# Patient Record
Sex: Female | Born: 1945 | Race: White | Hispanic: No | Marital: Single | Smoking: Former smoker
Health system: Southern US, Community
[De-identification: ages and names within clinical notes are randomized; demographics above are authoritative.]

## PROBLEM LIST (undated history)

## (undated) DIAGNOSIS — F419 Anxiety disorder, unspecified: Secondary | ICD-10-CM

## (undated) DIAGNOSIS — F410 Panic disorder [episodic paroxysmal anxiety] without agoraphobia: Secondary | ICD-10-CM

## (undated) DIAGNOSIS — J449 Chronic obstructive pulmonary disease, unspecified: Secondary | ICD-10-CM

## (undated) HISTORY — PX: TUBAL LIGATION: SHX77

---

## 2009-03-06 ENCOUNTER — Emergency Department (HOSPITAL_COMMUNITY): Admission: EM | Admit: 2009-03-06 | Discharge: 2009-03-06 | Payer: Self-pay | Admitting: Emergency Medicine

## 2010-12-16 ENCOUNTER — Emergency Department (HOSPITAL_COMMUNITY)
Admission: EM | Admit: 2010-12-16 | Discharge: 2010-12-16 | Disposition: A | Discharge status: Home / Self Care | Payer: Medicare Other | Attending: Emergency Medicine | Admitting: Emergency Medicine

## 2010-12-16 ENCOUNTER — Emergency Department (HOSPITAL_COMMUNITY): Payer: Medicare Other

## 2010-12-16 DIAGNOSIS — R569 Unspecified convulsions: Secondary | ICD-10-CM | POA: Insufficient documentation

## 2010-12-16 DIAGNOSIS — S81009A Unspecified open wound, unspecified knee, initial encounter: Secondary | ICD-10-CM | POA: Insufficient documentation

## 2010-12-16 DIAGNOSIS — W230XXA Caught, crushed, jammed, or pinched between moving objects, initial encounter: Secondary | ICD-10-CM | POA: Insufficient documentation

## 2010-12-16 DIAGNOSIS — S91009A Unspecified open wound, unspecified ankle, initial encounter: Secondary | ICD-10-CM | POA: Insufficient documentation

## 2010-12-16 DIAGNOSIS — S8010XA Contusion of unspecified lower leg, initial encounter: Secondary | ICD-10-CM | POA: Insufficient documentation

## 2010-12-16 DIAGNOSIS — Y92009 Unspecified place in unspecified non-institutional (private) residence as the place of occurrence of the external cause: Secondary | ICD-10-CM | POA: Insufficient documentation

## 2010-12-16 DIAGNOSIS — M79609 Pain in unspecified limb: Secondary | ICD-10-CM | POA: Insufficient documentation

## 2010-12-16 DIAGNOSIS — J4489 Other specified chronic obstructive pulmonary disease: Secondary | ICD-10-CM | POA: Insufficient documentation

## 2010-12-16 DIAGNOSIS — J449 Chronic obstructive pulmonary disease, unspecified: Secondary | ICD-10-CM | POA: Insufficient documentation

## 2011-07-16 ENCOUNTER — Emergency Department (HOSPITAL_COMMUNITY): Payer: Medicare Other

## 2011-07-16 ENCOUNTER — Emergency Department (HOSPITAL_COMMUNITY)
Admission: EM | Admit: 2011-07-16 | Discharge: 2011-07-16 | Disposition: A | Discharge status: Home / Self Care | Payer: Medicare Other | Attending: Emergency Medicine | Admitting: Emergency Medicine

## 2011-07-16 ENCOUNTER — Encounter: Payer: Self-pay | Admitting: *Deleted

## 2011-07-16 DIAGNOSIS — M545 Low back pain, unspecified: Secondary | ICD-10-CM | POA: Insufficient documentation

## 2011-07-16 DIAGNOSIS — J4489 Other specified chronic obstructive pulmonary disease: Secondary | ICD-10-CM | POA: Insufficient documentation

## 2011-07-16 DIAGNOSIS — W19XXXA Unspecified fall, initial encounter: Secondary | ICD-10-CM | POA: Insufficient documentation

## 2011-07-16 DIAGNOSIS — J449 Chronic obstructive pulmonary disease, unspecified: Secondary | ICD-10-CM | POA: Insufficient documentation

## 2011-07-16 DIAGNOSIS — F411 Generalized anxiety disorder: Secondary | ICD-10-CM | POA: Insufficient documentation

## 2011-07-16 DIAGNOSIS — S139XXA Sprain of joints and ligaments of unspecified parts of neck, initial encounter: Secondary | ICD-10-CM | POA: Insufficient documentation

## 2011-07-16 DIAGNOSIS — S161XXA Strain of muscle, fascia and tendon at neck level, initial encounter: Secondary | ICD-10-CM

## 2011-07-16 DIAGNOSIS — S8010XA Contusion of unspecified lower leg, initial encounter: Secondary | ICD-10-CM | POA: Insufficient documentation

## 2011-07-16 DIAGNOSIS — M25539 Pain in unspecified wrist: Secondary | ICD-10-CM | POA: Insufficient documentation

## 2011-07-16 DIAGNOSIS — IMO0002 Reserved for concepts with insufficient information to code with codable children: Secondary | ICD-10-CM | POA: Insufficient documentation

## 2011-07-16 DIAGNOSIS — M542 Cervicalgia: Secondary | ICD-10-CM | POA: Insufficient documentation

## 2011-07-16 DIAGNOSIS — Z79899 Other long term (current) drug therapy: Secondary | ICD-10-CM | POA: Insufficient documentation

## 2011-07-16 DIAGNOSIS — M25579 Pain in unspecified ankle and joints of unspecified foot: Secondary | ICD-10-CM | POA: Insufficient documentation

## 2011-07-16 DIAGNOSIS — M79609 Pain in unspecified limb: Secondary | ICD-10-CM | POA: Insufficient documentation

## 2011-07-16 DIAGNOSIS — R071 Chest pain on breathing: Secondary | ICD-10-CM | POA: Insufficient documentation

## 2011-07-16 DIAGNOSIS — M25571 Pain in right ankle and joints of right foot: Secondary | ICD-10-CM

## 2011-07-16 DIAGNOSIS — T148XXA Other injury of unspecified body region, initial encounter: Secondary | ICD-10-CM

## 2011-07-16 HISTORY — DX: Chronic obstructive pulmonary disease, unspecified: J44.9

## 2011-07-16 HISTORY — DX: Panic disorder (episodic paroxysmal anxiety): F41.0

## 2011-07-16 HISTORY — DX: Anxiety disorder, unspecified: F41.9

## 2011-07-16 MED ORDER — OXYCODONE-ACETAMINOPHEN 5-325 MG PO TABS: 1.0000 | ORAL_TABLET | ORAL | Status: AC | PRN

## 2011-07-16 MED ORDER — KETOROLAC TROMETHAMINE 60 MG/2ML IM SOLN
60.0000 mg | Freq: Once | INTRAMUSCULAR | Status: AC
Start: 2011-07-16 — End: 2011-07-16
  Administered 2011-07-16: 60 mg via INTRAMUSCULAR
  Filled 2011-07-16: qty 2

## 2011-07-16 NOTE — ED Notes (Signed)
Ambulated to bathroom with 1 assist. Gait slow but steady

## 2011-07-16 NOTE — ED Notes (Signed)
Pt. C/O right rib and back pain

## 2011-07-16 NOTE — ED Notes (Signed)
Pt. Discharged to home, to discharge via w/c, Pt. Alert and oriented, NAD noted,
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
## 2012-12-18 ENCOUNTER — Ambulatory Visit (INDEPENDENT_AMBULATORY_CARE_PROVIDER_SITE_OTHER): Payer: Medicare Other | Admitting: Family Medicine

## 2012-12-18 VITALS — BP 137/78 | HR 70 | Temp 97.6°F | Resp 18 | Ht 63.0 in | Wt 295.0 lb

## 2012-12-18 DIAGNOSIS — S46911A Strain of unspecified muscle, fascia and tendon at shoulder and upper arm level, right arm, initial encounter: Secondary | ICD-10-CM

## 2012-12-18 DIAGNOSIS — S139XXA Sprain of joints and ligaments of unspecified parts of neck, initial encounter: Secondary | ICD-10-CM

## 2012-12-18 DIAGNOSIS — M25562 Pain in left knee: Secondary | ICD-10-CM

## 2012-12-18 DIAGNOSIS — M2550 Pain in unspecified joint: Secondary | ICD-10-CM

## 2012-12-18 DIAGNOSIS — IMO0001 Reserved for inherently not codable concepts without codable children: Secondary | ICD-10-CM

## 2012-12-18 DIAGNOSIS — IMO0002 Reserved for concepts with insufficient information to code with codable children: Secondary | ICD-10-CM

## 2012-12-18 DIAGNOSIS — S161XXA Strain of muscle, fascia and tendon at neck level, initial encounter: Secondary | ICD-10-CM

## 2012-12-18 MED ORDER — TRAMADOL HCL 50 MG PO TABS: 50.0000 mg | ORAL_TABLET | Freq: Three times a day (TID) | ORAL | Status: DC | PRN

## 2012-12-18 MED ORDER — OXAPROZIN 600 MG PO TABS: ORAL_TABLET | ORAL | Status: DC

## 2012-12-18 MED ORDER — METHOCARBAMOL 500 MG PO TABS: ORAL_TABLET | ORAL | Status: DC

## 2012-12-18 NOTE — Patient Instructions (Addendum)
Take medications as directed  Apply ice and heat alternately to the neck several times a day.  Return if not steadily improving
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
## 2012-12-19 ENCOUNTER — Encounter: Payer: Self-pay | Admitting: Family Medicine

## 2012-12-29 ENCOUNTER — Telehealth: Payer: Self-pay | Admitting: Radiology

## 2012-12-29 NOTE — Telephone Encounter (Signed)
Tammy at [facility] [address] called about patient, requesting additional notes. Advised only note we have is from office visit on 12/16/12 , she states they already have this.

## 2013-01-15 ENCOUNTER — Emergency Department (HOSPITAL_COMMUNITY): Payer: Medicare Other

## 2013-01-15 ENCOUNTER — Emergency Department (HOSPITAL_COMMUNITY)
Admission: EM | Admit: 2013-01-15 | Discharge: 2013-01-15 | Disposition: A | Discharge status: Home / Self Care | Payer: Medicare Other | Attending: Emergency Medicine | Admitting: Emergency Medicine

## 2013-01-15 ENCOUNTER — Encounter (HOSPITAL_COMMUNITY): Payer: Self-pay | Admitting: Adult Health

## 2013-01-15 DIAGNOSIS — J449 Chronic obstructive pulmonary disease, unspecified: Secondary | ICD-10-CM | POA: Insufficient documentation

## 2013-01-15 DIAGNOSIS — J4489 Other specified chronic obstructive pulmonary disease: Secondary | ICD-10-CM | POA: Insufficient documentation

## 2013-01-15 DIAGNOSIS — IMO0002 Reserved for concepts with insufficient information to code with codable children: Secondary | ICD-10-CM | POA: Insufficient documentation

## 2013-01-15 DIAGNOSIS — Z79899 Other long term (current) drug therapy: Secondary | ICD-10-CM | POA: Insufficient documentation

## 2013-01-15 DIAGNOSIS — F410 Panic disorder [episodic paroxysmal anxiety] without agoraphobia: Secondary | ICD-10-CM | POA: Insufficient documentation

## 2013-01-15 DIAGNOSIS — R339 Retention of urine, unspecified: Secondary | ICD-10-CM | POA: Insufficient documentation

## 2013-01-15 DIAGNOSIS — Z87891 Personal history of nicotine dependence: Secondary | ICD-10-CM | POA: Insufficient documentation

## 2013-01-15 LAB — URINALYSIS W MICROSCOPIC + REFLEX CULTURE
Hgb urine dipstick: NEGATIVE
Specific Gravity, Urine: 1.015 (ref 1.005–1.030)
Urobilinogen, UA: 0.2 mg/dL (ref 0.0–1.0)

## 2013-01-15 LAB — POCT I-STAT TROPONIN I
Troponin i, poc: 0 ng/mL (ref 0.00–0.08)
Troponin i, poc: 0 ng/mL (ref 0.00–0.08)

## 2013-01-15 LAB — BASIC METABOLIC PANEL
BUN: 15 mg/dL (ref 6–23)
CO2: 29 mEq/L (ref 19–32)
Chloride: 104 mEq/L (ref 96–112)
Creatinine, Ser: 0.8 mg/dL (ref 0.50–1.10)

## 2013-01-15 LAB — CBC
HCT: 43.8 % (ref 36.0–46.0)
MCV: 96.5 fL (ref 78.0–100.0)
Platelets: 217 10*3/uL (ref 150–400)
RBC: 4.54 MIL/uL (ref 3.87–5.11)
WBC: 9.5 10*3/uL (ref 4.0–10.5)

## 2013-01-15 MED ORDER — CIPROFLOXACIN HCL 250 MG PO TABS: 250.0000 mg | ORAL_TABLET | Freq: Two times a day (BID) | ORAL | Status: DC

## 2013-01-15 MED ORDER — OXYCODONE-ACETAMINOPHEN 5-325 MG PO TABS
1.0000 | ORAL_TABLET | Freq: Once | ORAL | Status: AC
  Administered 2013-01-15: 1 via ORAL
  Filled 2013-01-15: qty 1
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
## 2013-01-15 NOTE — ED Notes (Signed)
Pt  Discharged.Vital signs stable and GCS 15 

## 2013-01-15 NOTE — ED Provider Notes (Signed)
I have personally seen and examined the patient.  I have discussed the plan of care with the resident.  I have reviewed the documentation on PMH/FH/Soc. History.  I have reviewed the documentation of the resident and agree.  Sible Straley, MD 01/15/13 2347 
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
## 2013-01-15 NOTE — ED Notes (Signed)
Bladder scan 435 ml

## 2013-01-17 ENCOUNTER — Emergency Department (HOSPITAL_COMMUNITY): Payer: Medicare Other

## 2013-01-17 ENCOUNTER — Encounter (HOSPITAL_COMMUNITY): Payer: Self-pay | Admitting: Emergency Medicine

## 2013-01-17 ENCOUNTER — Emergency Department (HOSPITAL_COMMUNITY)
Admission: EM | Admit: 2013-01-17 | Discharge: 2013-01-17 | Disposition: A | Discharge status: Home / Self Care | Payer: Medicare Other | Attending: Emergency Medicine | Admitting: Emergency Medicine

## 2013-01-17 DIAGNOSIS — M51379 Other intervertebral disc degeneration, lumbosacral region without mention of lumbar back pain or lower extremity pain: Secondary | ICD-10-CM | POA: Insufficient documentation

## 2013-01-17 DIAGNOSIS — M5137 Other intervertebral disc degeneration, lumbosacral region: Secondary | ICD-10-CM | POA: Insufficient documentation

## 2013-01-17 DIAGNOSIS — R399 Unspecified symptoms and signs involving the genitourinary system: Secondary | ICD-10-CM

## 2013-01-17 DIAGNOSIS — Z79899 Other long term (current) drug therapy: Secondary | ICD-10-CM | POA: Insufficient documentation

## 2013-01-17 DIAGNOSIS — F411 Generalized anxiety disorder: Secondary | ICD-10-CM | POA: Insufficient documentation

## 2013-01-17 DIAGNOSIS — M5136 Other intervertebral disc degeneration, lumbar region: Secondary | ICD-10-CM

## 2013-01-17 DIAGNOSIS — J449 Chronic obstructive pulmonary disease, unspecified: Secondary | ICD-10-CM | POA: Insufficient documentation

## 2013-01-17 DIAGNOSIS — M545 Low back pain, unspecified: Secondary | ICD-10-CM | POA: Insufficient documentation

## 2013-01-17 DIAGNOSIS — IMO0002 Reserved for concepts with insufficient information to code with codable children: Secondary | ICD-10-CM | POA: Insufficient documentation

## 2013-01-17 DIAGNOSIS — Z87891 Personal history of nicotine dependence: Secondary | ICD-10-CM | POA: Insufficient documentation

## 2013-01-17 DIAGNOSIS — J4489 Other specified chronic obstructive pulmonary disease: Secondary | ICD-10-CM | POA: Insufficient documentation

## 2013-01-17 LAB — URINE CULTURE: Culture: NO GROWTH
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
## 2013-01-17 NOTE — ED Notes (Signed)
Pt  Was able to walk to the bathroom and void 40 ml. RN notified.
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
## 2013-02-02 ENCOUNTER — Encounter: Payer: Self-pay | Admitting: Emergency Medicine

## 2013-02-03 ENCOUNTER — Encounter: Payer: Self-pay | Admitting: Emergency Medicine

## 2013-02-03 ENCOUNTER — Ambulatory Visit (INDEPENDENT_AMBULATORY_CARE_PROVIDER_SITE_OTHER): Payer: Medicare Other | Admitting: Emergency Medicine

## 2013-02-03 VITALS — BP 122/70 | HR 80 | Temp 97.7°F | Ht 63.0 in | Wt 207.8 lb

## 2013-02-03 DIAGNOSIS — J449 Chronic obstructive pulmonary disease, unspecified: Secondary | ICD-10-CM

## 2013-02-03 DIAGNOSIS — F411 Generalized anxiety disorder: Secondary | ICD-10-CM | POA: Insufficient documentation

## 2013-02-03 DIAGNOSIS — J309 Allergic rhinitis, unspecified: Secondary | ICD-10-CM | POA: Insufficient documentation

## 2013-02-03 NOTE — Progress Notes (Signed)
Subjective:    Patient ID: Kathy Skinner, female    DOB: 24-Oct-1945, 67 y.o.   MRN: [id number]  HPI 67 yo former smoker (50 pk-yrs), hx COPD dx in ~2004, also carries dx ? asthma although suspect mostly COPD. Anxiety and panic attacks. Has been maintained on Advair + Spiriva for years.  She uses albuterol prn, for the last 3-4 weeks has used 4x a day.  She has been having more SOB at rest and w exertion. More secretions and cough.  Has baseline daily wheezing. She is on zyrtec + singulair. She has not tolerated nasal sprays in the past.   She is on chantix recently, has been on for several year off and on that she is using at times when she feels that she is going to restart smoking.   Cleda Daub 5/'14 at PCP:  FVC 2.0L 68 % FEV1 1.2L 49% Ratio 58%   Review of Systems  Constitutional: Negative for fever and unexpected weight change.  HENT: Positive for congestion, postnasal drip and sinus pressure. Negative for ear pain, nosebleeds, sore throat, rhinorrhea, sneezing, trouble swallowing and dental problem.   Eyes: Negative for redness and itching.  Respiratory: Positive for cough, chest tightness, shortness of breath and wheezing.   Cardiovascular: Positive for leg swelling. Negative for palpitations.  Gastrointestinal: Negative for nausea and vomiting.  Genitourinary: Negative for dysuria.  Musculoskeletal: Negative for joint swelling.  Skin: Negative for rash.  Neurological: Positive for dizziness and headaches.  Hematological: Does not bruise/bleed easily.  Psychiatric/Behavioral: Negative for dysphoric mood. The patient is not nervous/anxious.     Past Medical History  Diagnosis Date  . COPD (chronic obstructive pulmonary disease)   . Asthma   . Anxiety   . Panic attacks      Family History  Problem Relation Age of Onset  . Cancer Mother   . Aneurysm Father      History   Social History  . Marital Status: Single    Spouse Name: N/A    Number of Children: N/A  . Years  of Education: N/A   Occupational History  . Not on file.   Social History Main Topics  . Smoking status: Former Smoker -- 1.00 packs/day for 50 years    Types: Cigarettes    Quit date: 12/19/1999  . Smokeless tobacco: Never Used  . Alcohol Use: No  . Drug Use: No  . Sexually Active: Not Currently   Other Topics Concern  . Not on file   Social History Narrative  . No narrative on file  has been exposed to pesticides, mulches, gardening.   Allergies  Allergen Reactions  . Codeine Nausea Only  . Neurontin (Gabapentin)     seizures  . Tramadol Swelling     Outpatient Prescriptions Prior to Visit  Medication Sig Dispense Refill  . albuterol (PROVENTIL HFA;VENTOLIN HFA) 108 (90 BASE) MCG/ACT inhaler Inhale 2 puffs into the lungs every 6 (six) hours as needed for wheezing.      Marland Kitchen albuterol (PROVENTIL) (2.5 MG/3ML) 0.083% nebulizer solution Take 2.5 mg by nebulization every 6 (six) hours as needed for wheezing.      Marland Kitchen ALPRAZolam (XANAX) 1 MG tablet Take 1 mg by mouth daily. For anxiety      . cetirizine (ZYRTEC) 10 MG tablet Take 10 mg by mouth daily as needed for allergies.      . diazepam (VALIUM) 10 MG tablet Take 10 mg by mouth daily.       . Fluticasone-Salmeterol (ADVAIR)  500-50 MCG/DOSE AEPB Inhale 1 puff into the lungs every 12 (twelve) hours.      Marland Kitchen losartan-hydrochlorothiazide (HYZAAR) 100-25 MG per tablet Take 0.5-1 tablets by mouth daily.       . montelukast (SINGULAIR) 10 MG tablet Take 10 mg by mouth every morning.       . naproxen sodium (ANAPROX) 220 MG tablet Take 440 mg by mouth 2 (two) times daily as needed (for pain).      Marland Kitchen tiotropium (SPIRIVA) 18 MCG inhalation capsule Place 18 mcg into inhaler and inhale daily.       No facility-administered medications prior to visit.         Objective:   Physical Exam Filed Vitals:   02/03/13 1414  BP: 122/70  Pulse: 80  Temp: 97.7 F (36.5 C)  TempSrc: Oral  Height: 5\' 3"  (1.6 m)  Weight: 207 lb 12.8 oz  (94.257 kg)  SpO2: 93%   Gen: Pleasant, overwt, in no distress,  normal affect  ENT: No lesions,  mouth clear,  oropharynx clear, no postnasal drip  Neck: No JVD, no TMG, no carotid bruits, insp and exp stridor  Lungs: No use of accessory muscles, some coarse sounds and lot of referred UA noise.   Cardiovascular: RRR, heart sounds normal, no murmur or gallops, no peripheral edema  Musculoskeletal: No deformities, no cyanosis or clubbing  Neuro: alert, non focal  Skin: Warm, no lesions or rashes      Assessment & Plan:  Allergic rhinitis Will continue singulair, try using zyrtec daily through the allergy season and then wean to off per her usual pattern  COPD (chronic obstructive pulmonary disease) continue same BDs Repeat {PFT at a future date.  CXR next time Ov 3 mon

## 2013-02-03 NOTE — Assessment & Plan Note (Signed)
Will continue singulair, try using zyrtec daily through the allergy season and then wean to off per her usual pattern

## 2013-02-03 NOTE — Assessment & Plan Note (Signed)
continue same BDs Repeat {PFT at a future date.  CXR next time Ov 3 mon

## 2013-02-03 NOTE — Patient Instructions (Addendum)
Please continue your Advair and Spiriva as you are taking them You may want to try using your Zyrtec every day through the pollen season, then try to wean to off Continue your Singulair Use your albuterol as needed Follow with Dr Delton Coombes in 3 months or sooner if you have any problems.

## 2013-02-16 ENCOUNTER — Telehealth: Payer: Self-pay | Admitting: Emergency Medicine

## 2013-02-16 NOTE — Telephone Encounter (Signed)
Pt got offices mixed up--meant to call [facility]  Nothing further needed.

## 2013-05-12 ENCOUNTER — Encounter: Payer: Self-pay | Admitting: Emergency Medicine

## 2013-05-12 ENCOUNTER — Ambulatory Visit (INDEPENDENT_AMBULATORY_CARE_PROVIDER_SITE_OTHER): Payer: Medicare Other | Admitting: Emergency Medicine

## 2013-05-12 VITALS — BP 130/82 | HR 79 | Temp 97.5°F | Ht 63.0 in | Wt 180.6 lb

## 2013-05-12 DIAGNOSIS — J449 Chronic obstructive pulmonary disease, unspecified: Secondary | ICD-10-CM

## 2013-05-12 MED ORDER — ACLIDINIUM BROMIDE 400 MCG/ACT IN AEPB: 400.0000 ug | INHALATION_SPRAY | Freq: Two times a day (BID) | RESPIRATORY_TRACT | Status: DC

## 2013-05-12 NOTE — Patient Instructions (Addendum)
Please stay off Spiriva  Start Kathy Skinner twice a day Continue Advair twice a day.  Continue your allergy medications Follow with Dr Delton Coombes in 4 -6 weeks.

## 2013-05-12 NOTE — Progress Notes (Signed)
  Subjective:    Patient ID: Kathy Skinner, female    DOB: [birth year], 67 y.o.   MRN: [id number]  HPI 67 yo former smoker (50 pk-yrs), hx COPD dx in ~2004, also carries dx ? asthma although suspect mostly COPD. Anxiety and panic attacks. Has been maintained on Advair + Spiriva for years.  She uses albuterol prn, for the last 3-4 weeks has used 4x a day.  She has been having more SOB at rest and w exertion. More secretions and cough.  Has baseline daily wheezing. She is on zyrtec + singulair. She has not tolerated nasal sprays in the past.   She is on chantix recently, has been on for several year off and on that she is using at times when she feels that she is going to restart smoking.   Cleda Daub 5/'14 at PCP:  FVC 2.0L 68 % FEV1 1.2L 49% Ratio 58%  ROV 05/12/13 -- f/u for COPD and allergies.  Last time added zyrtec daily, continued spiriva and advair, but she ran out of the spiriva 4-5 weeks ago.  She states that her breathing has been worse since last time, using nebs more frequently. She has panic attacks that keep her from going outside to exercise. When she does so she is more SOB. She is having more cough, more mucous.     Review of Systems  Constitutional: Negative for fever and unexpected weight change.  HENT: Positive for congestion, postnasal drip and sinus pressure. Negative for ear pain, nosebleeds, sore throat, rhinorrhea, sneezing, trouble swallowing and dental problem.   Eyes: Negative for redness and itching.  Respiratory: Positive for cough, chest tightness, shortness of breath and wheezing.   Cardiovascular: Positive for leg swelling. Negative for palpitations.  Gastrointestinal: Negative for nausea and vomiting.  Genitourinary: Negative for dysuria.  Musculoskeletal: Negative for joint swelling.  Skin: Negative for rash.  Neurological: Positive for dizziness and headaches.  Hematological: Does not bruise/bleed easily.  Psychiatric/Behavioral: Negative for dysphoric mood. The  patient is not nervous/anxious.        Objective:   Physical Exam Filed Vitals:   05/12/13 1453  BP: 130/82  Pulse: 79  Temp: 97.5 F (36.4 C)  TempSrc: Oral  Height: 5\' 3"  (1.6 m)  Weight: 180 lb 9.6 oz (81.92 kg)  SpO2: 90%   Gen: Pleasant, overwt, in no distress,  normal affect  ENT: No lesions,  mouth clear,  oropharynx clear, no postnasal drip  Neck: No JVD, no TMG, no carotid bruits, mild exp stridor  Lungs: No use of accessory muscles, some coarse sounds and soft referred UA noise.   Cardiovascular: RRR, heart sounds normal, no murmur or gallops, no peripheral edema  Musculoskeletal: No deformities, no cyanosis or clubbing  Neuro: alert, non focal  Skin: Warm, no lesions or rashes      Assessment & Plan:  COPD (chronic obstructive pulmonary disease) - continue advair - stay off spiriva and start tudorza - SABA prn - rov 4-6 weeks to assess status

## 2013-05-12 NOTE — Assessment & Plan Note (Signed)
-   continue advair - stay off spiriva and start tudorza - SABA prn - rov 4-6 weeks to assess status

## 2013-06-30 ENCOUNTER — Ambulatory Visit: Payer: Medicare Other | Admitting: Emergency Medicine

## 2013-07-01 ENCOUNTER — Ambulatory Visit (INDEPENDENT_AMBULATORY_CARE_PROVIDER_SITE_OTHER): Payer: Medicare Other | Admitting: Emergency Medicine

## 2013-07-01 ENCOUNTER — Encounter: Payer: Self-pay | Admitting: Emergency Medicine

## 2013-07-01 VITALS — BP 110/60 | HR 73 | Ht 63.0 in | Wt 200.4 lb

## 2013-07-01 DIAGNOSIS — J449 Chronic obstructive pulmonary disease, unspecified: Secondary | ICD-10-CM

## 2013-07-01 NOTE — Patient Instructions (Signed)
Please continue your current medications  Follow with Dr Borghild Thaker in 3 months or sooner if you have any problems.  

## 2013-07-01 NOTE — Progress Notes (Signed)
  Subjective:    Patient ID: Kathy Skinner, female    DOB: [birth year], 67 y.o.   MRN: [id number]  HPI 67 yo former smoker (50 pk-yrs), hx COPD dx in ~2004, also carries dx ? asthma although suspect mostly COPD. Anxiety and panic attacks. Has been maintained on Advair + Spiriva for years.  She uses albuterol prn, for the last 3-4 weeks has used 4x a day.  She has been having more SOB at rest and w exertion. More secretions and cough.  Has baseline daily wheezing. She is on zyrtec + singulair. She has not tolerated nasal sprays in the past.   She is on chantix recently, has been on for several year off and on that she is using at times when she feels that she is going to restart smoking.   Cleda Daub 5/'14 at PCP:  FVC 2.0L 68 % FEV1 1.2L 49% Ratio 58%  ROV 05/12/13 -- f/u for COPD and allergies.  Last time added zyrtec daily, continued spiriva and advair, but she ran out of the spiriva 4-5 weeks ago.  She states that her breathing has been worse since last time, using nebs more frequently. She has panic attacks that keep her from going outside to exercise. When she does so she is more SOB. She is having more cough, more mucous.    ROV 06/30/13 -- f/u for COPD and allergies.  Last time continued Advair, added Tudorza bid. She believes that it is helping her. Has not needed SABA since last time. She describes a MSK discomfort in her chest. She has also had some cough. Needs zyrtec and prilosec. Remains on singulair.    Review of Systems  Constitutional: Negative for fever and unexpected weight change.  HENT: Positive for congestion. Negative for dental problem, ear pain, nosebleeds, postnasal drip, rhinorrhea, sinus pressure, sneezing, sore throat and trouble swallowing.   Eyes: Negative for redness and itching.  Respiratory: Positive for cough. Negative for chest tightness, shortness of breath and wheezing.   Cardiovascular: Negative for palpitations and leg swelling.  Gastrointestinal: Negative for  nausea and vomiting.  Genitourinary: Negative for dysuria.  Musculoskeletal: Negative for joint swelling.  Skin: Negative for rash.  Neurological: Negative for dizziness and headaches.  Hematological: Does not bruise/bleed easily.  Psychiatric/Behavioral: Negative for dysphoric mood. The patient is not nervous/anxious.        Objective:   Physical Exam Filed Vitals:   07/01/13 1447  BP: 110/60  Pulse: 73  Height: 5\' 3"  (1.6 m)  Weight: 200 lb 6.4 oz (90.901 kg)  SpO2: 96%   Gen: Pleasant, overwt, in no distress,  normal affect  ENT: No lesions,  mouth clear,  oropharynx clear, no postnasal drip  Neck: No JVD, no TMG, no carotid bruits, mild exp stridor  Lungs: No use of accessory muscles, some coarse sounds and soft referred UA noise.   Cardiovascular: RRR, heart sounds normal, no murmur or gallops, no peripheral edema  Musculoskeletal: No deformities, no cyanosis or clubbing  Neuro: alert, non focal  Skin: Warm, no lesions or rashes      Assessment & Plan:  COPD (chronic obstructive pulmonary disease) Improved on the tudorza. Will continue this and Advair, saba prn Allergy regimen as ordered.  rov 3

## 2013-07-01 NOTE — Assessment & Plan Note (Signed)
Improved on the tudorza. Will continue this and Advair, saba prn Allergy regimen as ordered.  rov 3

## 2013-07-14 ENCOUNTER — Other Ambulatory Visit: Payer: Self-pay | Admitting: *Deleted

## 2013-07-14 MED ORDER — MONTELUKAST SODIUM 10 MG PO TABS: 10.0000 mg | ORAL_TABLET | Freq: Every morning | ORAL | Status: DC

## 2013-07-18 ENCOUNTER — Telehealth: Payer: Self-pay | Admitting: Emergency Medicine

## 2013-07-18 NOTE — Telephone Encounter (Signed)
Rx was sent in 07/15/13. I called [facility] and confirmed this was received.  Pt is aware.

## 2013-08-16 ENCOUNTER — Other Ambulatory Visit: Payer: Self-pay | Admitting: *Deleted

## 2013-08-22 ENCOUNTER — Ambulatory Visit
Admission: RE | Admit: 2013-08-22 | Discharge: 2013-08-22 | Disposition: A | Discharge status: Home / Self Care | Payer: Medicare Other | Source: Ambulatory Visit | Attending: Family Medicine | Admitting: Family Medicine

## 2013-08-22 ENCOUNTER — Other Ambulatory Visit: Payer: Self-pay | Admitting: Family Medicine

## 2013-08-22 DIAGNOSIS — IMO0001 Reserved for inherently not codable concepts without codable children: Secondary | ICD-10-CM

## 2013-09-23 DIAGNOSIS — G3184 Mild cognitive impairment, so stated: Secondary | ICD-10-CM | POA: Diagnosis not present

## 2013-09-23 DIAGNOSIS — R29818 Other symptoms and signs involving the nervous system: Secondary | ICD-10-CM | POA: Diagnosis not present

## 2013-09-23 DIAGNOSIS — F489 Nonpsychotic mental disorder, unspecified: Secondary | ICD-10-CM | POA: Diagnosis not present

## 2013-09-29 DIAGNOSIS — R350 Frequency of micturition: Secondary | ICD-10-CM | POA: Diagnosis not present

## 2013-09-29 DIAGNOSIS — N3946 Mixed incontinence: Secondary | ICD-10-CM | POA: Diagnosis not present

## 2013-10-06 DIAGNOSIS — F322 Major depressive disorder, single episode, severe without psychotic features: Secondary | ICD-10-CM | POA: Diagnosis not present

## 2013-10-20 DIAGNOSIS — J449 Chronic obstructive pulmonary disease, unspecified: Secondary | ICD-10-CM | POA: Diagnosis not present

## 2013-10-20 DIAGNOSIS — R5381 Other malaise: Secondary | ICD-10-CM | POA: Diagnosis not present

## 2013-10-20 DIAGNOSIS — R5383 Other fatigue: Secondary | ICD-10-CM | POA: Diagnosis not present

## 2013-10-20 DIAGNOSIS — E559 Vitamin D deficiency, unspecified: Secondary | ICD-10-CM | POA: Diagnosis not present

## 2013-10-20 DIAGNOSIS — F17200 Nicotine dependence, unspecified, uncomplicated: Secondary | ICD-10-CM | POA: Diagnosis not present

## 2013-11-21 DIAGNOSIS — M542 Cervicalgia: Secondary | ICD-10-CM | POA: Diagnosis not present

## 2013-11-30 DIAGNOSIS — M25519 Pain in unspecified shoulder: Secondary | ICD-10-CM | POA: Diagnosis not present

## 2013-11-30 DIAGNOSIS — M19019 Primary osteoarthritis, unspecified shoulder: Secondary | ICD-10-CM | POA: Diagnosis not present

## 2013-12-05 DIAGNOSIS — M542 Cervicalgia: Secondary | ICD-10-CM | POA: Diagnosis not present

## 2013-12-12 DIAGNOSIS — J449 Chronic obstructive pulmonary disease, unspecified: Secondary | ICD-10-CM | POA: Diagnosis not present

## 2013-12-12 DIAGNOSIS — M549 Dorsalgia, unspecified: Secondary | ICD-10-CM | POA: Diagnosis not present

## 2013-12-12 DIAGNOSIS — E559 Vitamin D deficiency, unspecified: Secondary | ICD-10-CM | POA: Diagnosis not present

## 2013-12-12 DIAGNOSIS — F410 Panic disorder [episodic paroxysmal anxiety] without agoraphobia: Secondary | ICD-10-CM | POA: Diagnosis not present

## 2013-12-12 DIAGNOSIS — I10 Essential (primary) hypertension: Secondary | ICD-10-CM | POA: Diagnosis not present

## 2013-12-22 DIAGNOSIS — M47812 Spondylosis without myelopathy or radiculopathy, cervical region: Secondary | ICD-10-CM | POA: Diagnosis not present

## 2013-12-23 DIAGNOSIS — F489 Nonpsychotic mental disorder, unspecified: Secondary | ICD-10-CM | POA: Diagnosis not present

## 2013-12-23 DIAGNOSIS — G3184 Mild cognitive impairment, so stated: Secondary | ICD-10-CM | POA: Diagnosis not present

## 2013-12-23 DIAGNOSIS — F5105 Insomnia due to other mental disorder: Secondary | ICD-10-CM | POA: Diagnosis not present

## 2014-01-05 DIAGNOSIS — F322 Major depressive disorder, single episode, severe without psychotic features: Secondary | ICD-10-CM | POA: Diagnosis not present

## 2014-01-09 DIAGNOSIS — E785 Hyperlipidemia, unspecified: Secondary | ICD-10-CM | POA: Diagnosis not present

## 2014-01-09 DIAGNOSIS — R5383 Other fatigue: Secondary | ICD-10-CM | POA: Diagnosis not present

## 2014-01-09 DIAGNOSIS — Z09 Encounter for follow-up examination after completed treatment for conditions other than malignant neoplasm: Secondary | ICD-10-CM | POA: Diagnosis not present

## 2014-01-09 DIAGNOSIS — R5381 Other malaise: Secondary | ICD-10-CM | POA: Diagnosis not present

## 2014-01-16 DIAGNOSIS — J449 Chronic obstructive pulmonary disease, unspecified: Secondary | ICD-10-CM | POA: Diagnosis not present

## 2014-01-16 DIAGNOSIS — Z0000 Encounter for general adult medical examination without abnormal findings: Secondary | ICD-10-CM | POA: Diagnosis not present

## 2014-01-16 DIAGNOSIS — J45909 Unspecified asthma, uncomplicated: Secondary | ICD-10-CM | POA: Diagnosis not present

## 2014-01-16 DIAGNOSIS — M542 Cervicalgia: Secondary | ICD-10-CM | POA: Diagnosis not present

## 2014-01-26 ENCOUNTER — Other Ambulatory Visit: Payer: Self-pay | Admitting: *Deleted

## 2014-01-26 MED ORDER — MONTELUKAST SODIUM 10 MG PO TABS: 10.0000 mg | ORAL_TABLET | Freq: Every morning | ORAL | Status: DC

## 2014-02-07 DIAGNOSIS — M5412 Radiculopathy, cervical region: Secondary | ICD-10-CM | POA: Diagnosis not present

## 2014-02-09 DIAGNOSIS — F322 Major depressive disorder, single episode, severe without psychotic features: Secondary | ICD-10-CM | POA: Diagnosis not present

## 2014-02-23 DIAGNOSIS — M47812 Spondylosis without myelopathy or radiculopathy, cervical region: Secondary | ICD-10-CM | POA: Diagnosis not present

## 2014-03-15 DIAGNOSIS — F411 Generalized anxiety disorder: Secondary | ICD-10-CM | POA: Diagnosis not present

## 2014-03-15 DIAGNOSIS — I10 Essential (primary) hypertension: Secondary | ICD-10-CM | POA: Diagnosis not present

## 2014-03-15 DIAGNOSIS — M5412 Radiculopathy, cervical region: Secondary | ICD-10-CM | POA: Diagnosis not present

## 2014-03-15 DIAGNOSIS — J449 Chronic obstructive pulmonary disease, unspecified: Secondary | ICD-10-CM | POA: Diagnosis not present

## 2014-04-03 DIAGNOSIS — G3184 Mild cognitive impairment, so stated: Secondary | ICD-10-CM | POA: Diagnosis not present

## 2014-04-03 DIAGNOSIS — F5105 Insomnia due to other mental disorder: Secondary | ICD-10-CM | POA: Diagnosis not present

## 2014-04-03 DIAGNOSIS — F489 Nonpsychotic mental disorder, unspecified: Secondary | ICD-10-CM | POA: Diagnosis not present

## 2014-04-06 DIAGNOSIS — F322 Major depressive disorder, single episode, severe without psychotic features: Secondary | ICD-10-CM | POA: Diagnosis not present

## 2014-04-06 DIAGNOSIS — M5412 Radiculopathy, cervical region: Secondary | ICD-10-CM | POA: Diagnosis not present

## 2014-04-28 DIAGNOSIS — M5412 Radiculopathy, cervical region: Secondary | ICD-10-CM | POA: Diagnosis not present

## 2014-04-28 DIAGNOSIS — M19019 Primary osteoarthritis, unspecified shoulder: Secondary | ICD-10-CM | POA: Diagnosis not present

## 2014-05-02 ENCOUNTER — Other Ambulatory Visit: Payer: Self-pay | Admitting: Emergency Medicine

## 2014-05-02 MED ORDER — FLUTICASONE-SALMETEROL 500-50 MCG/DOSE IN AEPB: 1.0000 | INHALATION_SPRAY | Freq: Two times a day (BID) | RESPIRATORY_TRACT | Status: DC

## 2014-05-04 DIAGNOSIS — M5412 Radiculopathy, cervical region: Secondary | ICD-10-CM | POA: Diagnosis not present

## 2014-05-04 DIAGNOSIS — J449 Chronic obstructive pulmonary disease, unspecified: Secondary | ICD-10-CM | POA: Diagnosis not present

## 2014-05-04 DIAGNOSIS — R209 Unspecified disturbances of skin sensation: Secondary | ICD-10-CM | POA: Diagnosis not present

## 2014-05-12 ENCOUNTER — Ambulatory Visit (INDEPENDENT_AMBULATORY_CARE_PROVIDER_SITE_OTHER): Payer: Self-pay | Admitting: Radiology

## 2014-05-12 ENCOUNTER — Ambulatory Visit (INDEPENDENT_AMBULATORY_CARE_PROVIDER_SITE_OTHER): Payer: Medicare Other | Admitting: Neurology

## 2014-05-12 DIAGNOSIS — R209 Unspecified disturbances of skin sensation: Secondary | ICD-10-CM

## 2014-05-12 DIAGNOSIS — Z0289 Encounter for other administrative examinations: Secondary | ICD-10-CM

## 2014-05-12 DIAGNOSIS — G5620 Lesion of ulnar nerve, unspecified upper limb: Secondary | ICD-10-CM | POA: Diagnosis not present

## 2014-05-12 DIAGNOSIS — G5622 Lesion of ulnar nerve, left upper limb: Secondary | ICD-10-CM

## 2014-05-12 NOTE — Procedures (Signed)
     HISTORY:  Kathy Skinner is a 68 year old patient with a history of numbness in the ulnar aspect of the left hand associated with some weakness of the left hand that began approximately one month ago. The patient is being evaluated for possible neuropathy or a cervical radiculopathy.  NERVE CONDUCTION STUDIES:  Nerve conduction studies were performed on both upper extremities. The distal motor latencies for the median nerves were within normal limits bilaterally, with normal motor amplitudes seen for these nerves. The distal motor latencies and motor amplitudes for the ulnar nerves were normal bilaterally, with prolongation of the left ulnar F-wave latency. The nerve conduction velocities for the median nerves bilaterally and for the right ulnar nerve were normal, and the F wave latencies for the median nerves were normal bilaterally. Nerve conduction velocities for the left ulnar nerve were slowed above and below the elbow, and following Erb's point stimulation. The sensory latencies for the median nerves bilaterally and for the right ulnar nerve were normal, but the left ulnar sensory latency was unobtainable. The radial sensory latencies were borderline normal on the right, normal on the left.  EMG STUDIES:  EMG study was performed on the left upper extremity:  The first dorsal interosseous muscle reveals 2 to 5 K units with decreased recruitment. One plus fibrillations and positive waves were noted. The abductor pollicis brevis muscle reveals 2 to 4 K units with full recruitment. No fibrillations or positive waves were noted. The extensor indicis proprius muscle reveals 1 to 3 K units with full recruitment. No fibrillations or positive waves were noted. The pronator teres muscle reveals 2 to 3 K units with full recruitment. No fibrillations or positive waves were noted. The flexor digitorum profundus muscle (I-IV) revealed 2 to 4 K units with full recruitment. No fibrillations or positive  waves were seen. The biceps muscle reveals 1 to 2 K units with full recruitment. No fibrillations or positive waves were noted. The triceps muscle reveals 2 to 4 K units with full recruitment. No fibrillations or positive waves were noted. The anterior deltoid muscle reveals 2 to 3 K units with full recruitment. No fibrillations or positive waves were noted. The cervical paraspinal muscles were tested at 2 levels. No abnormalities of insertional activity were seen at either level tested. There was good relaxation.   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 There is no evidence of an overlying cervical radiculopathy.  Jill Alexanders MD 05/12/2014 10:48 AM  [facility] [address] [place], [place] [address]  Phone [phone] Fax [phone]

## 2014-05-15 ENCOUNTER — Encounter: Payer: Medicare Other | Admitting: Neurology

## 2014-05-15 ENCOUNTER — Telehealth: Payer: Self-pay | Admitting: *Deleted

## 2014-05-15 ENCOUNTER — Encounter: Payer: Medicare Other | Admitting: Radiology

## 2014-05-15 NOTE — Telephone Encounter (Signed)
Pt here.  Area is opposite thumb side wrist area.  Soft, non tender.  More pronounced when wrist flexed. 1x2inches approx.  Dr. Jannifer Franklin examined, thinks ganglion cyst.   Pt will address with Dr. Mina Marble.   Per Dr. Jannifer Franklin, not related to EMG done (opposite side).  Pt accompanied to front area.  Faxed to Dr.Wang [place]/EMG report [phone].

## 2014-05-15 NOTE — Telephone Encounter (Signed)
I personally examined the patient today. The area appears to be cystic, likely a ganglion cyst. The area is nowhere near a needle stick occurred. I do not think this is related to the nerve conduction study

## 2014-05-15 NOTE — Telephone Encounter (Signed)
Pt calling and has had a lump from wrist/palm area from [facility]/EMG from Friday.    It is nontender, has not changed since Friday.  Called doctor on call.  Has used ibuprofen.      Is going to come by and we will look at.   FYI.

## 2014-05-24 DIAGNOSIS — M25539 Pain in unspecified wrist: Secondary | ICD-10-CM | POA: Diagnosis not present

## 2014-05-26 ENCOUNTER — Other Ambulatory Visit: Payer: Self-pay | Admitting: *Deleted

## 2014-05-26 DIAGNOSIS — J439 Emphysema, unspecified: Secondary | ICD-10-CM

## 2014-05-26 MED ORDER — ACLIDINIUM BROMIDE 400 MCG/ACT IN AEPB: 400.0000 ug | INHALATION_SPRAY | Freq: Two times a day (BID) | RESPIRATORY_TRACT | Status: DC

## 2014-05-30 DIAGNOSIS — G5620 Lesion of ulnar nerve, unspecified upper limb: Secondary | ICD-10-CM | POA: Diagnosis not present

## 2014-06-02 DIAGNOSIS — G5622 Lesion of ulnar nerve, left upper limb: Secondary | ICD-10-CM | POA: Diagnosis not present

## 2014-06-14 DIAGNOSIS — Z23 Encounter for immunization: Secondary | ICD-10-CM | POA: Diagnosis not present

## 2014-06-14 DIAGNOSIS — Z136 Encounter for screening for cardiovascular disorders: Secondary | ICD-10-CM | POA: Diagnosis not present

## 2014-06-14 DIAGNOSIS — Z1211 Encounter for screening for malignant neoplasm of colon: Secondary | ICD-10-CM | POA: Diagnosis not present

## 2014-06-14 DIAGNOSIS — E559 Vitamin D deficiency, unspecified: Secondary | ICD-10-CM | POA: Diagnosis not present

## 2014-06-14 DIAGNOSIS — Z0001 Encounter for general adult medical examination with abnormal findings: Secondary | ICD-10-CM | POA: Diagnosis not present

## 2014-06-14 DIAGNOSIS — N3946 Mixed incontinence: Secondary | ICD-10-CM | POA: Diagnosis not present

## 2014-06-14 DIAGNOSIS — R630 Anorexia: Secondary | ICD-10-CM | POA: Diagnosis not present

## 2014-06-14 DIAGNOSIS — J449 Chronic obstructive pulmonary disease, unspecified: Secondary | ICD-10-CM | POA: Diagnosis not present

## 2014-06-14 DIAGNOSIS — I10 Essential (primary) hypertension: Secondary | ICD-10-CM | POA: Diagnosis not present

## 2014-06-28 ENCOUNTER — Other Ambulatory Visit: Payer: Self-pay | Admitting: Emergency Medicine

## 2014-06-29 DIAGNOSIS — R630 Anorexia: Secondary | ICD-10-CM | POA: Diagnosis not present

## 2014-06-29 DIAGNOSIS — R634 Abnormal weight loss: Secondary | ICD-10-CM | POA: Diagnosis not present

## 2014-06-29 DIAGNOSIS — R110 Nausea: Secondary | ICD-10-CM | POA: Diagnosis not present

## 2014-07-05 DIAGNOSIS — F322 Major depressive disorder, single episode, severe without psychotic features: Secondary | ICD-10-CM | POA: Diagnosis not present

## 2014-07-07 ENCOUNTER — Other Ambulatory Visit: Payer: Self-pay | Admitting: Emergency Medicine

## 2014-07-11 DIAGNOSIS — K3189 Other diseases of stomach and duodenum: Secondary | ICD-10-CM | POA: Diagnosis not present

## 2014-07-11 DIAGNOSIS — K2980 Duodenitis without bleeding: Secondary | ICD-10-CM | POA: Diagnosis not present

## 2014-07-11 DIAGNOSIS — R110 Nausea: Secondary | ICD-10-CM | POA: Diagnosis not present

## 2014-07-11 DIAGNOSIS — K2950 Unspecified chronic gastritis without bleeding: Secondary | ICD-10-CM | POA: Diagnosis not present

## 2014-07-11 DIAGNOSIS — R630 Anorexia: Secondary | ICD-10-CM | POA: Diagnosis not present

## 2014-07-11 DIAGNOSIS — R634 Abnormal weight loss: Secondary | ICD-10-CM | POA: Diagnosis not present

## 2014-07-19 DIAGNOSIS — M8580 Other specified disorders of bone density and structure, unspecified site: Secondary | ICD-10-CM | POA: Diagnosis not present

## 2014-07-25 ENCOUNTER — Other Ambulatory Visit: Payer: Self-pay | Admitting: Gastroenterology

## 2014-07-25 DIAGNOSIS — R634 Abnormal weight loss: Secondary | ICD-10-CM

## 2014-08-02 ENCOUNTER — Ambulatory Visit
Admission: RE | Admit: 2014-08-02 | Discharge: 2014-08-02 | Disposition: A | Discharge status: Home / Self Care | Payer: Medicare Other | Source: Ambulatory Visit | Attending: Gastroenterology | Admitting: Gastroenterology

## 2014-08-02 DIAGNOSIS — R110 Nausea: Secondary | ICD-10-CM | POA: Diagnosis not present

## 2014-08-02 DIAGNOSIS — K529 Noninfective gastroenteritis and colitis, unspecified: Secondary | ICD-10-CM | POA: Diagnosis not present

## 2014-08-02 DIAGNOSIS — R634 Abnormal weight loss: Secondary | ICD-10-CM | POA: Diagnosis not present

## 2014-08-02 DIAGNOSIS — K5730 Diverticulosis of large intestine without perforation or abscess without bleeding: Secondary | ICD-10-CM | POA: Diagnosis not present

## 2014-08-02 MED ORDER — IOHEXOL 300 MG/ML  SOLN
100.0000 mL | Freq: Once | INTRAMUSCULAR | Status: AC | PRN
  Administered 2014-08-02: 100 mL via INTRAVENOUS

## 2014-08-03 DIAGNOSIS — M8580 Other specified disorders of bone density and structure, unspecified site: Secondary | ICD-10-CM | POA: Diagnosis not present

## 2014-08-12 ENCOUNTER — Other Ambulatory Visit: Payer: Self-pay | Admitting: Emergency Medicine

## 2014-08-16 DIAGNOSIS — M9901 Segmental and somatic dysfunction of cervical region: Secondary | ICD-10-CM | POA: Diagnosis not present

## 2014-08-16 DIAGNOSIS — M9902 Segmental and somatic dysfunction of thoracic region: Secondary | ICD-10-CM | POA: Diagnosis not present

## 2014-08-16 DIAGNOSIS — M47812 Spondylosis without myelopathy or radiculopathy, cervical region: Secondary | ICD-10-CM | POA: Diagnosis not present

## 2014-08-16 DIAGNOSIS — M542 Cervicalgia: Secondary | ICD-10-CM | POA: Diagnosis not present

## 2014-08-16 DIAGNOSIS — M5033 Other cervical disc degeneration, cervicothoracic region: Secondary | ICD-10-CM | POA: Diagnosis not present

## 2014-08-29 DIAGNOSIS — M9901 Segmental and somatic dysfunction of cervical region: Secondary | ICD-10-CM | POA: Diagnosis not present

## 2014-08-29 DIAGNOSIS — M5033 Other cervical disc degeneration, cervicothoracic region: Secondary | ICD-10-CM | POA: Diagnosis not present

## 2014-08-29 DIAGNOSIS — M47812 Spondylosis without myelopathy or radiculopathy, cervical region: Secondary | ICD-10-CM | POA: Diagnosis not present

## 2014-08-29 DIAGNOSIS — M542 Cervicalgia: Secondary | ICD-10-CM | POA: Diagnosis not present

## 2014-08-29 DIAGNOSIS — M9902 Segmental and somatic dysfunction of thoracic region: Secondary | ICD-10-CM | POA: Diagnosis not present

## 2014-08-30 DIAGNOSIS — M47812 Spondylosis without myelopathy or radiculopathy, cervical region: Secondary | ICD-10-CM | POA: Diagnosis not present

## 2014-08-30 DIAGNOSIS — M542 Cervicalgia: Secondary | ICD-10-CM | POA: Diagnosis not present

## 2014-08-30 DIAGNOSIS — M9901 Segmental and somatic dysfunction of cervical region: Secondary | ICD-10-CM | POA: Diagnosis not present

## 2014-08-30 DIAGNOSIS — M9902 Segmental and somatic dysfunction of thoracic region: Secondary | ICD-10-CM | POA: Diagnosis not present

## 2014-08-30 DIAGNOSIS — M5033 Other cervical disc degeneration, cervicothoracic region: Secondary | ICD-10-CM | POA: Diagnosis not present

## 2014-09-04 DIAGNOSIS — M9902 Segmental and somatic dysfunction of thoracic region: Secondary | ICD-10-CM | POA: Diagnosis not present

## 2014-09-04 DIAGNOSIS — M9901 Segmental and somatic dysfunction of cervical region: Secondary | ICD-10-CM | POA: Diagnosis not present

## 2014-09-04 DIAGNOSIS — M47812 Spondylosis without myelopathy or radiculopathy, cervical region: Secondary | ICD-10-CM | POA: Diagnosis not present

## 2014-09-04 DIAGNOSIS — M542 Cervicalgia: Secondary | ICD-10-CM | POA: Diagnosis not present

## 2014-09-04 DIAGNOSIS — M5033 Other cervical disc degeneration, cervicothoracic region: Secondary | ICD-10-CM | POA: Diagnosis not present

## 2014-09-06 DIAGNOSIS — M9902 Segmental and somatic dysfunction of thoracic region: Secondary | ICD-10-CM | POA: Diagnosis not present

## 2014-09-06 DIAGNOSIS — M542 Cervicalgia: Secondary | ICD-10-CM | POA: Diagnosis not present

## 2014-09-06 DIAGNOSIS — M5033 Other cervical disc degeneration, cervicothoracic region: Secondary | ICD-10-CM | POA: Diagnosis not present

## 2014-09-06 DIAGNOSIS — M9901 Segmental and somatic dysfunction of cervical region: Secondary | ICD-10-CM | POA: Diagnosis not present

## 2014-09-06 DIAGNOSIS — M47812 Spondylosis without myelopathy or radiculopathy, cervical region: Secondary | ICD-10-CM | POA: Diagnosis not present

## 2014-09-08 DIAGNOSIS — D3131 Benign neoplasm of right choroid: Secondary | ICD-10-CM | POA: Diagnosis not present

## 2014-09-08 DIAGNOSIS — Z961 Presence of intraocular lens: Secondary | ICD-10-CM | POA: Diagnosis not present

## 2014-09-08 DIAGNOSIS — H31002 Unspecified chorioretinal scars, left eye: Secondary | ICD-10-CM | POA: Diagnosis not present

## 2014-09-08 DIAGNOSIS — H01001 Unspecified blepharitis right upper eyelid: Secondary | ICD-10-CM | POA: Diagnosis not present

## 2014-09-11 DIAGNOSIS — J309 Allergic rhinitis, unspecified: Secondary | ICD-10-CM | POA: Diagnosis not present

## 2014-09-11 DIAGNOSIS — R634 Abnormal weight loss: Secondary | ICD-10-CM | POA: Diagnosis not present

## 2014-09-11 DIAGNOSIS — J449 Chronic obstructive pulmonary disease, unspecified: Secondary | ICD-10-CM | POA: Diagnosis not present

## 2014-09-11 DIAGNOSIS — R630 Anorexia: Secondary | ICD-10-CM | POA: Diagnosis not present

## 2014-09-13 DIAGNOSIS — K648 Other hemorrhoids: Secondary | ICD-10-CM | POA: Diagnosis not present

## 2014-09-13 DIAGNOSIS — R634 Abnormal weight loss: Secondary | ICD-10-CM | POA: Diagnosis not present

## 2014-09-13 DIAGNOSIS — K635 Polyp of colon: Secondary | ICD-10-CM | POA: Diagnosis not present

## 2014-09-13 DIAGNOSIS — K621 Rectal polyp: Secondary | ICD-10-CM | POA: Diagnosis not present

## 2014-09-13 DIAGNOSIS — K6289 Other specified diseases of anus and rectum: Secondary | ICD-10-CM | POA: Diagnosis not present

## 2014-09-13 DIAGNOSIS — R933 Abnormal findings on diagnostic imaging of other parts of digestive tract: Secondary | ICD-10-CM | POA: Diagnosis not present

## 2014-09-13 DIAGNOSIS — D125 Benign neoplasm of sigmoid colon: Secondary | ICD-10-CM | POA: Diagnosis not present

## 2014-09-13 DIAGNOSIS — D127 Benign neoplasm of rectosigmoid junction: Secondary | ICD-10-CM | POA: Diagnosis not present

## 2014-09-13 DIAGNOSIS — K5730 Diverticulosis of large intestine without perforation or abscess without bleeding: Secondary | ICD-10-CM | POA: Diagnosis not present

## 2014-09-14 DIAGNOSIS — M9901 Segmental and somatic dysfunction of cervical region: Secondary | ICD-10-CM | POA: Diagnosis not present

## 2014-09-14 DIAGNOSIS — M5033 Other cervical disc degeneration, cervicothoracic region: Secondary | ICD-10-CM | POA: Diagnosis not present

## 2014-09-14 DIAGNOSIS — M542 Cervicalgia: Secondary | ICD-10-CM | POA: Diagnosis not present

## 2014-09-14 DIAGNOSIS — M47812 Spondylosis without myelopathy or radiculopathy, cervical region: Secondary | ICD-10-CM | POA: Diagnosis not present

## 2014-09-14 DIAGNOSIS — M9902 Segmental and somatic dysfunction of thoracic region: Secondary | ICD-10-CM | POA: Diagnosis not present

## 2014-09-18 ENCOUNTER — Other Ambulatory Visit: Payer: Self-pay | Admitting: Emergency Medicine

## 2014-09-18 DIAGNOSIS — G5622 Lesion of ulnar nerve, left upper limb: Secondary | ICD-10-CM | POA: Diagnosis not present

## 2014-09-19 DIAGNOSIS — F411 Generalized anxiety disorder: Secondary | ICD-10-CM | POA: Diagnosis not present

## 2014-09-19 DIAGNOSIS — E559 Vitamin D deficiency, unspecified: Secondary | ICD-10-CM | POA: Diagnosis not present

## 2014-09-19 DIAGNOSIS — E785 Hyperlipidemia, unspecified: Secondary | ICD-10-CM | POA: Diagnosis not present

## 2014-09-19 DIAGNOSIS — Z720 Tobacco use: Secondary | ICD-10-CM | POA: Diagnosis not present

## 2014-09-19 DIAGNOSIS — M8580 Other specified disorders of bone density and structure, unspecified site: Secondary | ICD-10-CM | POA: Diagnosis not present

## 2014-09-19 DIAGNOSIS — I10 Essential (primary) hypertension: Secondary | ICD-10-CM | POA: Diagnosis not present

## 2014-09-19 DIAGNOSIS — J449 Chronic obstructive pulmonary disease, unspecified: Secondary | ICD-10-CM | POA: Diagnosis not present

## 2014-09-20 ENCOUNTER — Other Ambulatory Visit: Payer: Self-pay | Admitting: Emergency Medicine

## 2014-09-20 DIAGNOSIS — G5622 Lesion of ulnar nerve, left upper limb: Secondary | ICD-10-CM | POA: Diagnosis not present

## 2014-09-21 DIAGNOSIS — M5033 Other cervical disc degeneration, cervicothoracic region: Secondary | ICD-10-CM | POA: Diagnosis not present

## 2014-09-21 DIAGNOSIS — M9901 Segmental and somatic dysfunction of cervical region: Secondary | ICD-10-CM | POA: Diagnosis not present

## 2014-09-21 DIAGNOSIS — M9902 Segmental and somatic dysfunction of thoracic region: Secondary | ICD-10-CM | POA: Diagnosis not present

## 2014-09-21 DIAGNOSIS — M47812 Spondylosis without myelopathy or radiculopathy, cervical region: Secondary | ICD-10-CM | POA: Diagnosis not present

## 2014-09-21 DIAGNOSIS — M542 Cervicalgia: Secondary | ICD-10-CM | POA: Diagnosis not present

## 2014-09-25 DIAGNOSIS — M542 Cervicalgia: Secondary | ICD-10-CM | POA: Diagnosis not present

## 2014-09-25 DIAGNOSIS — M47812 Spondylosis without myelopathy or radiculopathy, cervical region: Secondary | ICD-10-CM | POA: Diagnosis not present

## 2014-09-25 DIAGNOSIS — M9902 Segmental and somatic dysfunction of thoracic region: Secondary | ICD-10-CM | POA: Diagnosis not present

## 2014-09-25 DIAGNOSIS — M5033 Other cervical disc degeneration, cervicothoracic region: Secondary | ICD-10-CM | POA: Diagnosis not present

## 2014-09-25 DIAGNOSIS — M9901 Segmental and somatic dysfunction of cervical region: Secondary | ICD-10-CM | POA: Diagnosis not present

## 2014-10-02 DIAGNOSIS — R350 Frequency of micturition: Secondary | ICD-10-CM | POA: Diagnosis not present

## 2014-10-02 DIAGNOSIS — N3946 Mixed incontinence: Secondary | ICD-10-CM | POA: Diagnosis not present

## 2014-10-03 DIAGNOSIS — K6289 Other specified diseases of anus and rectum: Secondary | ICD-10-CM | POA: Diagnosis not present

## 2014-10-09 ENCOUNTER — Other Ambulatory Visit: Payer: Self-pay

## 2014-10-09 DIAGNOSIS — Z1231 Encounter for screening mammogram for malignant neoplasm of breast: Secondary | ICD-10-CM

## 2014-10-12 ENCOUNTER — Other Ambulatory Visit: Payer: Self-pay | Admitting: Emergency Medicine

## 2014-10-13 DIAGNOSIS — F5105 Insomnia due to other mental disorder: Secondary | ICD-10-CM | POA: Diagnosis not present

## 2014-10-13 DIAGNOSIS — G3184 Mild cognitive impairment, so stated: Secondary | ICD-10-CM | POA: Diagnosis not present

## 2014-10-18 DIAGNOSIS — F322 Major depressive disorder, single episode, severe without psychotic features: Secondary | ICD-10-CM | POA: Diagnosis not present

## 2014-10-19 ENCOUNTER — Ambulatory Visit
Admission: RE | Admit: 2014-10-19 | Discharge: 2014-10-19 | Disposition: A | Discharge status: Home / Self Care | Payer: Medicare Other | Source: Ambulatory Visit

## 2014-10-19 DIAGNOSIS — Z1231 Encounter for screening mammogram for malignant neoplasm of breast: Secondary | ICD-10-CM

## 2014-11-02 ENCOUNTER — Encounter: Payer: Self-pay | Admitting: Emergency Medicine

## 2014-11-02 ENCOUNTER — Ambulatory Visit (INDEPENDENT_AMBULATORY_CARE_PROVIDER_SITE_OTHER): Payer: Medicare Other | Admitting: Emergency Medicine

## 2014-11-02 VITALS — BP 148/88 | HR 85 | Ht 63.0 in | Wt 170.0 lb

## 2014-11-02 DIAGNOSIS — J309 Allergic rhinitis, unspecified: Secondary | ICD-10-CM

## 2014-11-02 DIAGNOSIS — J449 Chronic obstructive pulmonary disease, unspecified: Secondary | ICD-10-CM | POA: Diagnosis not present

## 2014-11-02 NOTE — Assessment & Plan Note (Signed)
Continue your Advair and Caprice Renshaw, try to take these twice a day if possible.  Use your albuterol 2 puffs as needed for shortness of breath.  Follow with Dr Lamonte Sakai in 6 months or sooner if you have any problems

## 2014-11-02 NOTE — Assessment & Plan Note (Signed)
Being impacted by her gardening and also a new cat in her house.   Please continue your zyrtec Start fluticasone nasal spray, 2 sprays each nostril daily Follow with Dr Lamonte Sakai in 6 months or sooner if you have any problems

## 2014-11-02 NOTE — Progress Notes (Signed)
Subjective:    Patient ID: Kathy Skinner, female    DOB: December 21, 1945, 69 y.o.   MRN: [id number]  HPI 70 yo former smoker (68 pk-yrs), hx COPD dx in ~2004, also carries dx ? asthma although suspect mostly COPD. Anxiety and panic attacks. Has been maintained on Advair + Spiriva for years.  She uses albuterol prn, for the last 3-4 weeks has used 4x a day.  She has been having more SOB at rest and w exertion. More secretions and cough.  Has baseline daily wheezing. She is on zyrtec + singulair. She has not tolerated nasal sprays in the past.   She is on chantix recently, has been on for several year off and on that she is using at times when she feels that she is going to restart smoking.   Kathy Skinner 5/'14 at PCP:  FVC 2.0L 68 % FEV1 1.2L 49% Ratio 58%  ROV 05/12/13 -- f/u for COPD and allergies.  Last time added zyrtec daily, continued spiriva and advair, but she ran out of the spiriva 4-5 weeks ago.  She states that her breathing has been worse since last time, using nebs more frequently. She has panic attacks that keep her from going outside to exercise. When she does so she is more SOB. She is having more cough, more mucous.    ROV 06/30/13 -- f/u for COPD and allergies.  Last time continued Advair, added Tudorza bid. She believes that it is helping her. Has not needed SABA since last time. She describes a MSK discomfort in her chest. She has also had some cough. Needs zyrtec and prilosec. Remains on singulair.   ROV 11/02/14 -- follow-up visit for COPD, allergic rhinitis. She hasn't been seen since 2014. Her current regimen is Advair + Caprice Renshaw, but she usually dos not take at night. She reports that since last time she has had an eval for wt loss, also for ulnar radiculopathy. Has been stable from a breathing standpoint. She remains on zyrtec singulair.    Review of Systems  Constitutional: Negative for fever and unexpected weight change.  HENT: Positive for congestion. Negative for dental problem,  ear pain, nosebleeds, postnasal drip, rhinorrhea, sinus pressure, sneezing, sore throat and trouble swallowing.   Eyes: Negative for redness and itching.  Respiratory: Positive for cough. Negative for chest tightness, shortness of breath and wheezing.   Cardiovascular: Negative for palpitations and leg swelling.  Gastrointestinal: Negative for nausea and vomiting.  Genitourinary: Negative for dysuria.  Musculoskeletal: Negative for joint swelling.  Skin: Negative for rash.  Neurological: Negative for dizziness and headaches.  Hematological: Does not bruise/bleed easily.  Psychiatric/Behavioral: Negative for dysphoric mood. The patient is not nervous/anxious.        Objective:   Physical Exam Filed Vitals:   11/02/14 1513  BP: 148/88  Pulse: 85  Height: 5\' 3"  (1.6 m)  Weight: 170 lb (77.111 kg)  SpO2: 93%   Gen: Pleasant, overwt, in no distress,  normal affect  ENT: No lesions,  mouth clear,  oropharynx clear, no postnasal drip  Neck: No JVD, no TMG, no carotid bruits, mild exp stridor  Lungs: No use of accessory muscles, some coarse sounds and soft referred UA noise.   Cardiovascular: RRR, heart sounds normal, no murmur or gallops, no peripheral edema  Musculoskeletal: No deformities, no cyanosis or clubbing  Neuro: alert, non focal  Skin: Warm, no lesions or rashes      Assessment & Plan:  Allergic rhinitis Being impacted by her gardening and  also a new cat in her house.   Please continue your zyrtec Start fluticasone nasal spray, 2 sprays each nostril daily Follow with Dr Lamonte Sakai in 6 months or sooner if you have any problems   COPD (chronic obstructive pulmonary disease) Continue your Advair and Caprice Renshaw, try to take these twice a day if possible.  Use your albuterol 2 puffs as needed for shortness of breath.  Follow with Dr Lamonte Sakai in 6 months or sooner if you have any problems

## 2014-11-02 NOTE — Patient Instructions (Addendum)
Please continue your zyrtec Continue your Advair and Caprice Renshaw, try to take these twice a day if possible.  Start fluticasone nasal spray, 2 sprays each nostril daily Use your albuterol 2 puffs as needed for shortness of breath.  Follow with Dr Lamonte Sakai in 6 months or sooner if you have any problems

## 2014-11-06 ENCOUNTER — Telehealth: Payer: Self-pay | Admitting: Emergency Medicine

## 2014-11-06 MED ORDER — FLUTICASONE PROPIONATE 50 MCG/ACT NA SUSP: 2.0000 | Freq: Every day | NASAL | Status: DC

## 2014-11-06 NOTE — Telephone Encounter (Signed)
Per 11/02/14: Patient Instructions       Please continue your zyrtec Continue your Advair and Caprice Renshaw, try to take these twice a day if possible.   Start fluticasone nasal spray, 2 sprays each nostril daily Use your albuterol 2 puffs as needed for shortness of breath.  Follow with Dr Lamonte Sakai in 6 months or sooner if you have any problems  --   Pt aware RX sent in. Nothing further needed

## 2014-11-16 DIAGNOSIS — G5622 Lesion of ulnar nerve, left upper limb: Secondary | ICD-10-CM | POA: Diagnosis not present

## 2014-11-29 DIAGNOSIS — M5412 Radiculopathy, cervical region: Secondary | ICD-10-CM | POA: Diagnosis not present

## 2014-12-01 ENCOUNTER — Other Ambulatory Visit: Payer: Self-pay | Admitting: Emergency Medicine

## 2014-12-04 ENCOUNTER — Telehealth: Payer: Self-pay | Admitting: Emergency Medicine

## 2014-12-04 NOTE — Telephone Encounter (Signed)
Spoke with patients

## 2014-12-04 NOTE — Telephone Encounter (Signed)
Spoke with patient-states Kathy Skinner needs PA. I called patients pharmacy-they are faxing over the PA information to Triage. Will hold in Triage until fax comes through.

## 2014-12-05 NOTE — Telephone Encounter (Signed)
Forms have been filled out by [facility]. These have been given to me to have Dr. Lamonte Sakai sign. Once they are signed I will fax them back to the correct fax #.

## 2014-12-05 NOTE — Telephone Encounter (Signed)
Correction - Rest of PA from came through on fax - Will place in Dr Lamonte Sakai look at to complete the rest of PA form - Will forward to [place] to f/u on

## 2014-12-05 NOTE — Telephone Encounter (Signed)
Fax received from pharmacy.  Thomes Lolling PA dept at [phone],  Received PA fax from [place].  Form completed and faxed back to [phone].  Will forward to [place] to f/u on.

## 2014-12-05 NOTE — Telephone Encounter (Signed)
Kathy Skinner, from [employer] calling states she will be faxing over forms to see if pt has tried any other inhalers besides turdorza to complete PA, [phone]

## 2014-12-07 ENCOUNTER — Telehealth: Payer: Self-pay | Admitting: *Deleted

## 2014-12-07 NOTE — Telephone Encounter (Signed)
PA request received from Kristopher Oppenheim for Tudorza 400 mcg PA was initiated thru Cover my Meds SILVERSCRIPT Pt now has Medicare part D and not Medicaid BIN [id number] PCN [id number] ID [id number]. KEY [id number]

## 2014-12-07 NOTE — Telephone Encounter (Signed)
RB did not sign the PA when he was in the office on 12/05/14. He will be back in the office on 12/08/14. I will have him sign this form and will fax it back.

## 2014-12-08 NOTE — Telephone Encounter (Signed)
This is a duplicate message for pt's Caprice Renshaw PA. Will close.

## 2014-12-08 NOTE — Telephone Encounter (Signed)
Pt called back requesting an update. Informed pt form has been faxed (per Ria Comment). Pt requested I call [employer] to update them [phone], option 2, per pt). ATC [employer] X3 and the line was busy. Pt aware. Will await decision.   Will forward to [place] to follow.

## 2014-12-08 NOTE — Telephone Encounter (Signed)
PA has been signed by RB and faxed in.

## 2014-12-12 NOTE — Telephone Encounter (Signed)
She needs to be seen so we can discuss the options, decide what to start instead.

## 2014-12-12 NOTE — Telephone Encounter (Signed)
Exception forms from [place] were put in RA-s box, given to [employer] for follow up

## 2014-12-12 NOTE — Telephone Encounter (Signed)
Insurance will not cover [place]. Covered alternatives are Atrovent HFA, Incruse or Ipratropium Bromide.  RB - please advise. Thanks.

## 2014-12-12 NOTE — Telephone Encounter (Signed)
[place] with [facility] calling for additional information to finalize [place], [phone] case# [id number]

## 2014-12-12 NOTE — Telephone Encounter (Signed)
The form that was brought to me what the same form we filled out in the beginning.  Insurance is not going to cover [place]. Medication will have to be changed to Atrovent HFA, Incruse or Ipratropium Bromide.  RB - please advise. Thanks.

## 2014-12-12 NOTE — Telephone Encounter (Signed)
Spoke with pt, scheduled for Thursday at 4:00.  Nothing further needed.

## 2014-12-14 ENCOUNTER — Ambulatory Visit: Payer: Medicare Other | Admitting: Emergency Medicine

## 2014-12-19 ENCOUNTER — Encounter: Payer: Self-pay | Admitting: Emergency Medicine

## 2014-12-19 ENCOUNTER — Ambulatory Visit (INDEPENDENT_AMBULATORY_CARE_PROVIDER_SITE_OTHER): Payer: Medicare Other | Admitting: Emergency Medicine

## 2014-12-19 VITALS — BP 120/86 | HR 82 | Ht 62.0 in | Wt 157.0 lb

## 2014-12-19 DIAGNOSIS — J449 Chronic obstructive pulmonary disease, unspecified: Secondary | ICD-10-CM

## 2014-12-19 MED ORDER — FLUTICASONE-SALMETEROL 500-50 MCG/DOSE IN AEPB: INHALATION_SPRAY | RESPIRATORY_TRACT | Status: DC

## 2014-12-19 MED ORDER — UMECLIDINIUM BROMIDE 62.5 MCG/INH IN AEPB
1.0000 | INHALATION_SPRAY | Freq: Every day | RESPIRATORY_TRACT | Status: DC
Start: 2014-12-19 — End: 2015-01-08

## 2014-12-19 MED ORDER — MONTELUKAST SODIUM 10 MG PO TABS: 10.0000 mg | ORAL_TABLET | Freq: Every morning | ORAL | Status: DC

## 2014-12-19 NOTE — Addendum Note (Signed)
Addended by: Doroteo Glassman D on: 12/19/2014 01:07 PM   Modules accepted: Orders

## 2014-12-19 NOTE — Assessment & Plan Note (Signed)
Stop Kathy Skinner for now. We will continue to make efforts to get this for you in the future since she had benefited from it. We will start Incruse once a day to see if you benefit from this. If so then this may be a more acceptable medication for your insurance formulary Continued your Advair as you have been taking it. We will refill this today We will refill your singulair Follow with Dr Lamonte Sakai in 3 months or sooner if you have any problems.

## 2014-12-19 NOTE — Progress Notes (Signed)
Subjective:    Patient ID: Kathy Skinner, female    DOB: 1946/05/19, 69 y.o.   MRN: [id number]  HPI 69 yo former smoker (32 pk-yrs), hx COPD dx in ~2004, also carries dx ? asthma although suspect mostly COPD. Anxiety and panic attacks. Has been maintained on Advair + Spiriva for years.  She uses albuterol prn, for the last 3-4 weeks has used 4x a day.  She has been having more SOB at rest and w exertion. More secretions and cough.  Has baseline daily wheezing. She is on zyrtec + singulair. She has not tolerated nasal sprays in the past.   She is on chantix recently, has been on for several year off and on that she is using at times when she feels that she is going to restart smoking.   [name] 5/'14 at PCP:  FVC 2.0L 68 % FEV1 1.2L 49% Ratio 58%  ROV 05/12/13 -- f/u for COPD and allergies.  Last time added zyrtec daily, continued spiriva and advair, but she ran out of the spiriva 4-5 weeks ago.  She states that her breathing has been worse since last time, using nebs more frequently. She has panic attacks that keep her from going outside to exercise. When she does so she is more SOB. She is having more cough, more mucous.    ROV 06/30/13 -- f/u for COPD and allergies.  Last time continued Advair, added Tudorza bid. She believes that it is helping her. Has not needed SABA since last time. She describes a MSK discomfort in her chest. She has also had some cough. Needs zyrtec and prilosec. Remains on singulair.   ROV 11/02/14 -- follow-up visit for COPD, allergic rhinitis. She hasn't been seen since 2014. Her current regimen is Advair + Caprice Renshaw, but she usually dos not take at night. She reports that since last time she has had an eval for wt loss, also for ulnar radiculopathy. Has been stable from a breathing standpoint. She remains on zyrtec singulair.   ROV 12/19/14 -- follow up for COPD and allergic rhinitis. We have tried to get Caprice Renshaw for her - she benefited from it, but medicare won't cover it.  She is worse over the last 3 weeks when she had to stop the [place]. They do have Incruse on their formulary. If we aren't able to get Tudorza then we will switch to this. She believes the Advair helps her.    Review of Systems  Constitutional: Negative for fever and unexpected weight change.  HENT: Positive for congestion. Negative for dental problem, ear pain, nosebleeds, postnasal drip, rhinorrhea, sinus pressure, sneezing, sore throat and trouble swallowing.   Eyes: Negative for redness and itching.  Respiratory: Positive for cough. Negative for chest tightness, shortness of breath and wheezing.   Cardiovascular: Negative for palpitations and leg swelling.  Gastrointestinal: Negative for nausea and vomiting.  Genitourinary: Negative for dysuria.  Musculoskeletal: Negative for joint swelling.  Skin: Negative for rash.  Neurological: Negative for dizziness and headaches.  Hematological: Does not bruise/bleed easily.  Psychiatric/Behavioral: Negative for dysphoric mood. The patient is not nervous/anxious.        Objective:   Physical Exam Filed Vitals:   12/19/14 1218  BP: 120/86  Pulse: 82  Height: 5\' 2"  (1.575 m)  Weight: 157 lb (71.215 kg)  SpO2: 95%   Gen: Pleasant, overwt, in no distress,  normal affect  ENT: No lesions,  mouth clear,  oropharynx clear, no postnasal drip  Neck: No JVD, no TMG, no  carotid bruits, mild exp stridor  Lungs: No use of accessory muscles, diffuse B coarse exp wheezes.   Cardiovascular: RRR, heart sounds normal, no murmur or gallops, no peripheral edema  Musculoskeletal: No deformities, no cyanosis or clubbing  Neuro: alert, non focal  Skin: Warm, no lesions or rashes      Assessment & Plan:  COPD (chronic obstructive pulmonary disease) Stop Caprice Renshaw for now. We will continue to make efforts to get this for you in the future since she had benefited from it. We will start Incruse once a day to see if you benefit from this. If so then  this may be a more acceptable medication for your insurance formulary Continued your Advair as you have been taking it. We will refill this today We will refill your singulair Follow with Dr Lamonte Sakai in 3 months or sooner if you have any problems.

## 2014-12-19 NOTE — Patient Instructions (Signed)
Stop Kathy Skinner for now. We will continue to make efforts to get this for you in the future since she had benefited from it. We will start Incruse once a day to see if you benefit from this. If so then this may be a more acceptable medication for your insurance formulary Continued your Advair as you have been taking it. We will refill this today We will refill your singulair Follow with Kathy Skinner in 3 months or sooner if you have any problems.

## 2015-01-03 ENCOUNTER — Telehealth: Payer: Self-pay | Admitting: Emergency Medicine

## 2015-01-03 MED ORDER — UMECLIDINIUM BROMIDE 62.5 MCG/INH IN AEPB
1.0000 | INHALATION_SPRAY | Freq: Every day | RESPIRATORY_TRACT | Status: DC
Start: 2015-01-03 — End: 2015-01-08

## 2015-01-03 NOTE — Telephone Encounter (Signed)
It's OK with me to call it in, although I do want to discuss the side effects she's reporting in office.

## 2015-01-03 NOTE — Telephone Encounter (Signed)
I called spoke with pt. She is aware RB in office tomorrow. She is fine with a call back then

## 2015-01-03 NOTE — Telephone Encounter (Signed)
Called and spoke to pt. Pt stated her breathing is doing well. However, she is c/o blurred vision and diarrhea since starting the incruse. Pt stated the diarrhea has improved. Pt completed the incruse sample today. Pt denied any other issues-SOB, swelling, CP/tightness. .   RB please advise further.   Allergies  Allergen Reactions  . Codeine Nausea Only  . Neurontin [Gabapentin]     seizures  . Tramadol Swelling

## 2015-01-03 NOTE — Telephone Encounter (Signed)
Called and spoke to pt. Informed pt of the recs per RB. Rx sent to preferred pharmacy. Appt made with RB on 01/08/2015. Pt verbalized understanding and denied any further questions or concerns at this time.

## 2015-01-08 ENCOUNTER — Ambulatory Visit (INDEPENDENT_AMBULATORY_CARE_PROVIDER_SITE_OTHER): Payer: Medicare Other | Admitting: Emergency Medicine

## 2015-01-08 ENCOUNTER — Encounter: Payer: Self-pay | Admitting: Emergency Medicine

## 2015-01-08 VITALS — BP 122/88 | HR 85 | Ht 62.0 in | Wt 167.0 lb

## 2015-01-08 DIAGNOSIS — J449 Chronic obstructive pulmonary disease, unspecified: Secondary | ICD-10-CM | POA: Diagnosis not present

## 2015-01-08 MED ORDER — FLUTICASONE-SALMETEROL 500-50 MCG/DOSE IN AEPB: INHALATION_SPRAY | RESPIRATORY_TRACT | Status: DC

## 2015-01-08 NOTE — Assessment & Plan Note (Signed)
She has benefited from the Incruse but I am concerned about the blurred vision and her other side effects.. I think the most straightforward thing to do at this time is to have her see her ophthalmologist Dr. Satira Sark to check her eye pressures and also give [place] input about whether she needs to be off Incruse. Continue both Advair and Incruse for now, pending that consultation.

## 2015-01-08 NOTE — Progress Notes (Signed)
Subjective:    Patient ID: Kathy Skinner, female    DOB: 12-Nov-1945, 69 y.o.   MRN: [id number]  HPI 69 yo former smoker (50 pk-yrs), hx COPD dx in ~2004, also carries dx ? asthma although suspect mostly COPD. Anxiety and panic attacks. Has been maintained on Advair + Spiriva for years.  She uses albuterol prn, for the last 3-4 weeks has used 4x a day.  She has been having more SOB at rest and w exertion. More secretions and cough.  Has baseline daily wheezing. She is on zyrtec + singulair. She has not tolerated nasal sprays in the past.   She is on chantix recently, has been on for several year off and on that she is using at times when she feels that she is going to restart smoking.   Kathy Skinner 5/'14 at PCP:  FVC 2.0L 68 % FEV1 1.2L 49% Ratio 58%  ROV 05/12/13 -- f/u for COPD and allergies.  Last time added zyrtec daily, continued spiriva and advair, but she ran out of the spiriva 4-5 weeks ago.  She states that her breathing has been worse since last time, using nebs more frequently. She has panic attacks that keep her from going outside to exercise. When she does so she is more SOB. She is having more cough, more mucous.    ROV 06/30/13 -- f/u for COPD and allergies.  Last time continued Advair, added Tudorza bid. She believes that it is helping her. Has not needed SABA since last time. She describes a MSK discomfort in her chest. She has also had some cough. Needs zyrtec and prilosec. Remains on singulair.   ROV 11/02/14 -- follow-up visit for COPD, allergic rhinitis. She hasn't been seen since 2014. Her current regimen is Advair + Caprice Renshaw, but she usually dos not take at night. She reports that since last time she has had an eval for wt loss, also for ulnar radiculopathy. Has been stable from a breathing standpoint. She remains on zyrtec singulair.   ROV 12/19/14 -- follow up for COPD and allergic rhinitis. We have tried to get Caprice Renshaw for her - she benefited from it, but medicare won't cover it.  She is worse over the last 3 weeks when she had to stop the [place]. They do have Incruse on their formulary. If we aren't able to get Tudorza then we will switch to this. She believes the Advair helps her.   She is back on incruse, notes that she has has diarrhea but that her stools are improving. She also notes that she has gotten blurry vision while on. She has continued to have the sx. She sees Dr Satira Sark in optho, has had corneal transplants, other procedures.    Review of Systems  Constitutional: Negative for fever and unexpected weight change.  HENT: Negative for congestion, dental problem, ear pain, nosebleeds, postnasal drip, rhinorrhea, sinus pressure, sneezing, sore throat and trouble swallowing.   Eyes: Positive for visual disturbance. Negative for redness and itching.  Respiratory: Negative for cough, chest tightness, shortness of breath and wheezing.   Cardiovascular: Negative for palpitations and leg swelling.  Gastrointestinal: Negative for nausea and vomiting.  Genitourinary: Negative for dysuria.  Musculoskeletal: Negative for joint swelling.  Skin: Negative for rash.  Neurological: Negative for dizziness and headaches.  Hematological: Does not bruise/bleed easily.  Psychiatric/Behavioral: Negative for dysphoric mood. The patient is not nervous/anxious.        Objective:   Physical Exam Filed Vitals:   01/08/15 1640  BP: 122/88  Pulse: 85  Height: 5\' 2"  (1.575 m)  Weight: 167 lb (75.751 kg)  SpO2: 95%   Gen: Pleasant, overwt, in no distress,  normal affect  ENT: No lesions,  mouth clear,  oropharynx clear, no postnasal drip  Neck: No JVD, no TMG, no carotid bruits, mild exp stridor  Lungs: No use of accessory muscles, diffuse B coarse exp wheezes.   Cardiovascular: RRR, heart sounds normal, no murmur or gallops, no peripheral edema  Musculoskeletal: No deformities, no cyanosis or clubbing  Neuro: alert, non focal  Skin: Warm, no lesions or rashes       Assessment & Plan:  COPD (chronic obstructive pulmonary disease) She has benefited from the Incruse but I am concerned about the blurred vision and her other side effects.. I think the most straightforward thing to do at this time is to have her see her ophthalmologist Dr. Satira Sark to check her eye pressures and also give [place] input about whether she needs to be off Incruse. Continue both Advair and Incruse for now, pending that consultation.

## 2015-01-08 NOTE — Patient Instructions (Addendum)
Please continue your Advair as you have been taking it.  Continue your Incruse daily for now. I would like for you to see Dr Satira Sark to measure your eye pressures and to discuss this medication (and those similar) with regard to risks, especially the risk for glaucoma.  We will make adjustments depending on your eye exam and Dr Mellissa Kohut advice.  Rinse and gargle after taking any of these inhaled medications  Follow with Dr Lamonte Sakai in 1 month

## 2015-01-10 DIAGNOSIS — F322 Major depressive disorder, single episode, severe without psychotic features: Secondary | ICD-10-CM | POA: Diagnosis not present

## 2015-02-01 ENCOUNTER — Other Ambulatory Visit: Payer: Self-pay | Admitting: Gastroenterology

## 2015-02-01 DIAGNOSIS — R911 Solitary pulmonary nodule: Secondary | ICD-10-CM

## 2015-02-05 ENCOUNTER — Ambulatory Visit
Admission: RE | Admit: 2015-02-05 | Discharge: 2015-02-05 | Disposition: A | Discharge status: Home / Self Care | Payer: Medicare Other | Source: Ambulatory Visit | Attending: Gastroenterology | Admitting: Gastroenterology

## 2015-02-05 DIAGNOSIS — R0602 Shortness of breath: Secondary | ICD-10-CM | POA: Diagnosis not present

## 2015-02-05 DIAGNOSIS — R911 Solitary pulmonary nodule: Secondary | ICD-10-CM

## 2015-02-09 DIAGNOSIS — H40013 Open angle with borderline findings, low risk, bilateral: Secondary | ICD-10-CM | POA: Diagnosis not present

## 2015-02-13 ENCOUNTER — Encounter: Payer: Self-pay | Admitting: Emergency Medicine

## 2015-02-13 ENCOUNTER — Ambulatory Visit (INDEPENDENT_AMBULATORY_CARE_PROVIDER_SITE_OTHER): Payer: Medicare Other | Admitting: Emergency Medicine

## 2015-02-13 VITALS — BP 130/72 | HR 80 | Ht 62.0 in | Wt 164.0 lb

## 2015-02-13 DIAGNOSIS — J449 Chronic obstructive pulmonary disease, unspecified: Secondary | ICD-10-CM | POA: Diagnosis not present

## 2015-02-13 DIAGNOSIS — J309 Allergic rhinitis, unspecified: Secondary | ICD-10-CM

## 2015-02-13 DIAGNOSIS — G4710 Hypersomnia, unspecified: Secondary | ICD-10-CM | POA: Insufficient documentation

## 2015-02-13 NOTE — Progress Notes (Signed)
Subjective:    Patient ID: Kathy Skinner, female    DOB: 22-Aug-1946, 69 y.o.   MRN: [id number]  HPI 69 yo former smoker (29 pk-yrs), hx COPD dx in ~2004, also carries dx ? asthma although suspect mostly COPD. Anxiety and panic attacks. Has been maintained on Advair + Spiriva for years.  She uses albuterol prn, for the last 3-4 weeks has used 4x a day.  She has been having more SOB at rest and w exertion. More secretions and cough.  Has baseline daily wheezing. She is on zyrtec + singulair. She has not tolerated nasal sprays in the past.   She is on chantix recently, has been on for several year off and on that she is using at times when she feels that she is going to restart smoking.   [name] 5/'14 at PCP:  FVC 2.0L 68 % FEV1 1.2L 49% Ratio 58%  ROV 05/12/13 -- f/u for COPD and allergies.  Last time added zyrtec daily, continued spiriva and advair, but she ran out of the spiriva 4-5 weeks ago.  She states that her breathing has been worse since last time, using nebs more frequently. She has panic attacks that keep her from going outside to exercise. When she does so she is more SOB. She is having more cough, more mucous.    ROV 06/30/13 -- f/u for COPD and allergies.  Last time continued Advair, added Tudorza bid. She believes that it is helping her. Has not needed SABA since last time. She describes a MSK discomfort in her chest. She has also had some cough. Needs zyrtec and prilosec. Remains on singulair.   ROV 11/02/14 -- follow-up visit for COPD, allergic rhinitis. She hasn't been seen since 2014. Her current regimen is Advair + Caprice Renshaw, but she usually dos not take at night. She reports that since last time she has had an eval for wt loss, also for ulnar radiculopathy. Has been stable from a breathing standpoint. She remains on zyrtec singulair.   ROV 12/19/14 -- follow up for COPD and allergic rhinitis. We have tried to get Caprice Renshaw for her - she benefited from it, but medicare won't cover it.  She is worse over the last 3 weeks when she had to stop the [place]. They do have Incruse on their formulary. If we aren't able to get Tudorza then we will switch to this. She believes the Advair helps her.   She is back on incruse, notes that she has has diarrhea but that her stools are improving. She also notes that she has gotten blurry vision while on. She has continued to have the sx. She sees Dr Satira Sark in optho, has had corneal transplants, other procedures.   ROV 02/13/15 -- follow up visit for COPD and allergic rhinitis. After last visit we continue Advair and Incruse. She was to speak with her ophthalmologist about the potential impact of this medication on her eyes  - her eye pressures and evaluation were reassuring. She has benefited some, but does not feels as good as she did when she was on the [place]. She tells me today that she is concerned about some intermittent palmar erythema, raised veins.   She is waking with HA, taking naps. Some unintentional naps.   Review of Systems  Constitutional: Negative for fever and unexpected weight change.  HENT: Negative for congestion, dental problem, ear pain, nosebleeds, postnasal drip, rhinorrhea, sinus pressure, sneezing, sore throat and trouble swallowing.   Eyes: Positive for visual disturbance. Negative for  redness and itching.  Respiratory: Negative for cough, chest tightness, shortness of breath and wheezing.   Cardiovascular: Negative for palpitations and leg swelling.  Gastrointestinal: Negative for nausea and vomiting.  Genitourinary: Negative for dysuria.  Musculoskeletal: Negative for joint swelling.  Skin: Negative for rash.  Neurological: Negative for dizziness and headaches.  Hematological: Does not bruise/bleed easily.  Psychiatric/Behavioral: Negative for dysphoric mood. The patient is not nervous/anxious.        Objective:   Physical Exam Filed Vitals:   02/13/15 1428  BP: 130/72  Pulse: 80  Height: 5\' 2"  (1.575 m)   Weight: 164 lb (74.39 kg)  SpO2: 93%   Gen: Pleasant, overwt, in no distress,  normal affect  ENT: No lesions,  mouth clear,  oropharynx clear, no postnasal drip  Neck: No JVD, no TMG, no carotid bruits, mild exp stridor  Lungs: No use of accessory muscles, diffuse B coarse exp wheezes.   Cardiovascular: RRR, heart sounds normal, no murmur or gallops, no peripheral edema  Musculoskeletal: No deformities, no cyanosis or clubbing  Neuro: alert, non focal  Skin: Warm, no lesions or rashes      Assessment & Plan:  Hypersomnolence She has signs / sx of OSA. Will repeat her PSG, assess for OSA.   Allergic rhinitis Continue singulair, zyrtec, nasal steroid   COPD (chronic obstructive pulmonary disease) We will continue Incruse + Advair We will check full PFT as determine baseline.

## 2015-02-13 NOTE — Assessment & Plan Note (Signed)
Continue singulair, zyrtec, nasal steroid

## 2015-02-13 NOTE — Patient Instructions (Signed)
Please continue Advair and Incruse for now. We will follow your progress on these medications  We will perform full pulmonary function testing We will order a sleep study Continue Singulair, Zyrtec, nasal spray as you are taking them Follow with Dr Lamonte Sakai in 2 months or sooner if you have any problems. We will review your breathing test and your sleep test at that time

## 2015-02-13 NOTE — Assessment & Plan Note (Signed)
She has signs / sx of OSA. Will repeat her PSG, assess for OSA.

## 2015-02-13 NOTE — Assessment & Plan Note (Signed)
We will continue Incruse + Advair We will check full PFT as determine baseline.

## 2015-03-12 ENCOUNTER — Telehealth: Payer: Self-pay | Admitting: Emergency Medicine

## 2015-03-12 MED ORDER — PREDNISONE 10 MG PO TABS: ORAL_TABLET | ORAL | Status: DC

## 2015-03-12 MED ORDER — DOXYCYCLINE HYCLATE 100 MG PO TABS: 100.0000 mg | ORAL_TABLET | Freq: Two times a day (BID) | ORAL | Status: DC

## 2015-03-12 NOTE — Telephone Encounter (Signed)
She will need to be seen by TP or whomever can fit her in.  In meantime please order pred > Take 40mg  daily for 3 days, then 30mg  daily for 3 days, then 20mg  daily for 3 days, then 10mg  daily for 3 days, then stop Order doxycycline 100mg  bid x 7 days

## 2015-03-12 NOTE — Telephone Encounter (Signed)
Called pt and LMTCB x1 MW has openings tomorrow

## 2015-03-12 NOTE — Telephone Encounter (Signed)
Pt returned call - [phone]

## 2015-03-12 NOTE — Telephone Encounter (Signed)
Called pt and she is scheduled to see MW tomorrow at 2:30 and will be evaluated then. Nothing further needed

## 2015-03-12 NOTE — Telephone Encounter (Signed)
Called and spoke to pt. Pt c/o prod cough with green mucus that worsened over the weekend, increase in SOB, CP with activity then resolves with rest, diarrhea (unable to afford imodium) x 4 days. Pt states she is taking Tylenol #3 for the headaches. Pt denies f/c/s.   Dr. Lamonte Sakai please advise. Thanks.

## 2015-03-13 ENCOUNTER — Ambulatory Visit (INDEPENDENT_AMBULATORY_CARE_PROVIDER_SITE_OTHER): Payer: Medicare Other | Admitting: Internal Medicine

## 2015-03-13 ENCOUNTER — Encounter: Payer: Self-pay | Admitting: Internal Medicine

## 2015-03-13 VITALS — BP 120/84 | HR 85 | Ht 62.0 in | Wt 167.0 lb

## 2015-03-13 DIAGNOSIS — J449 Chronic obstructive pulmonary disease, unspecified: Secondary | ICD-10-CM | POA: Diagnosis not present

## 2015-03-13 DIAGNOSIS — J441 Chronic obstructive pulmonary disease with (acute) exacerbation: Secondary | ICD-10-CM

## 2015-03-13 MED ORDER — ACLIDINIUM BROMIDE 400 MCG/ACT IN AEPB
1.0000 | INHALATION_SPRAY | Freq: Two times a day (BID) | RESPIRATORY_TRACT | Status: DC
Start: 2015-03-13 — End: 2015-06-14

## 2015-03-13 NOTE — Patient Instructions (Addendum)
Stop incruse and start tudorza one twice daily  If doing great ok to leave off advair   Keep appt with dr Lamonte Sakai as planned

## 2015-03-13 NOTE — Progress Notes (Signed)
Subjective:    Patient ID: Salia Cangemi, female    DOB: 19-Apr-1946, 69 y.o.   MRN: [id number]  HPI 69 yo former smoker (69 pk-yrs), hx COPD dx in ~2004, also carries dx ? asthma although suspect mostly COPD. Anxiety and panic attacks.  g.   [name] 12/2012 at PCP:  FVC 2.0L 68 % FEV1 1.2L 49% Ratio 58%    ROV 02/13/15 -- follow up visit for COPD and allergic rhinitis. After last visit we continue Advair and Incruse. She was to speak with her ophthalmologist about the potential impact of this medication on her eyes  - her eye pressures and evaluation were reassuring. She has benefited some, but does not feels as good as she did when she was on the [place]. She tells me today that she is concerned about some intermittent palmar erythema, raised veins.  rec Please continue Advair and Incruse for now. We will follow your progress on these medications  We will perform full pulmonary function testing We will order a sleep study Continue Singulair, Zyrtec, nasal spray as you are taking them    03/13/2015 acute ov/Timmie Dugue re:  Copd/ quit smoking 2001  Chief Complaint  Patient presents with  . Acute Visit    Pt of RB. C/O productive cough with green mucus x 2 weeks. Pt states that mucus started out clear but is now green and thick. Pt also states worsening fatique and migrains    Breathing was fine on turdoza > says could walk anywhere she wanted to and on just advair losing ground then acutely worse x 2 weeks  Has doxy and pred called in 7/11 but hasn't picked up yet/ seems easily confused with details of her care.   No obvious day to day or daytime variability or assoc worse sob  or cp or chest tightness, subjective wheeze or overt sinus or hb symptoms. No unusual exp hx or h/o childhood pna/ asthma or knowledge of premature birth.  Sleeping ok without nocturnal  or early am exacerbation  of respiratory  c/o's or need for noct saba. Also denies any obvious fluctuation of symptoms with weather or  environmental changes or other aggravating or alleviating factors except as outlined above   Current Medications, Allergies, Complete Past Medical History, Past Surgical History, Family History, and Social History were reviewed in [employer] record.  ROS  The following are not active complaints unless bolded sore throat, dysphagia, dental problems, itching, sneezing,  nasal congestion or excess/ purulent secretions, ear ache,   fever, chills, sweats, unintended wt loss, classically pleuritic or exertional cp, hemoptysis,  orthopnea pnd or leg swelling, presyncope, palpitations, abdominal pain, anorexia, nausea, vomiting, diarrhea  or change in bowel or bladder habits, change in stools or urine, dysuria,hematuria,  rash, arthralgias, visual complaints, headache, numbness, weakness or ataxia or problems with walking or coordination,  change in mood/affect or memory.            Objective:   Physical Exam   Gen: Pleasant,  in no distress,  Wt Readings from Last 3 Encounters:  03/13/15 167 lb (75.751 kg)  02/13/15 164 lb (74.39 kg)  01/08/15 167 lb (75.751 kg)    Vital signs reviewed     ENT: No lesions,  mouth clear,  oropharynx clear, no postnasal drip  Neck: No JVD, no TMG, no carotid bruits, mild exp stridor  Lungs: No use of accessory muscles, diffuse B coarse exp wheezes.   Cardiovascular: RRR, heart sounds normal, no murmur or  gallops, no peripheral edema  Musculoskeletal: No deformities, no cyanosis or clubbing  Neuro: alert, non focal  Skin: Warm, no lesions or rashes      Assessment & Plan:

## 2015-03-14 ENCOUNTER — Encounter: Payer: Self-pay | Admitting: Internal Medicine

## 2015-03-14 DIAGNOSIS — J441 Chronic obstructive pulmonary disease with (acute) exacerbation: Secondary | ICD-10-CM | POA: Insufficient documentation

## 2015-03-14 NOTE — Assessment & Plan Note (Addendum)
Kathy Skinner 12/2012 at PCP:  FVC 2.0L 68 % FEV1 1.2L 49% Ratio 58%    So she has moderate/ severe copd at baseline and now acutely worse but not happy with incruse   The proper method of use, as well as anticipated side effects, of a metered-dose (dry powder)  inhaler are discussed and demonstrated to the patient. Improved effectiveness after extensive coaching during this visit to a level of approximately  90% with tudorza   I had an extended discussion with the patient reviewing all relevant studies completed to date and  lasting 15 to 20 minutes of a 25 minute visit       1) samples of tudorza to see if it really works so much better and if so ?  + try off advair or change to [facility] as not really satisfied with advair either   2) treat acute symptoms  With doxy/pred already called in   3) Each maintenance medication was reviewed in detail including most importantly the difference between maintenance and prns and under what circumstances the prns are to be triggered using an action plan format that is not reflected in the computer generated alphabetically organized AVS.    4) See instructions for specific recommendations which were reviewed directly with the patient who was given a copy with highlighter outlining the key components.

## 2015-03-14 NOTE — Assessment & Plan Note (Signed)
Already started on rx but hasn't picked up yet/ advised

## 2015-04-11 DIAGNOSIS — F322 Major depressive disorder, single episode, severe without psychotic features: Secondary | ICD-10-CM | POA: Diagnosis not present

## 2015-04-12 DIAGNOSIS — F322 Major depressive disorder, single episode, severe without psychotic features: Secondary | ICD-10-CM | POA: Diagnosis not present

## 2015-04-13 DIAGNOSIS — G3184 Mild cognitive impairment, so stated: Secondary | ICD-10-CM | POA: Diagnosis not present

## 2015-04-13 DIAGNOSIS — F5105 Insomnia due to other mental disorder: Secondary | ICD-10-CM | POA: Diagnosis not present

## 2015-04-23 DIAGNOSIS — Z79899 Other long term (current) drug therapy: Secondary | ICD-10-CM | POA: Diagnosis not present

## 2015-04-23 DIAGNOSIS — R630 Anorexia: Secondary | ICD-10-CM | POA: Diagnosis not present

## 2015-04-23 DIAGNOSIS — N390 Urinary tract infection, site not specified: Secondary | ICD-10-CM | POA: Diagnosis not present

## 2015-04-23 DIAGNOSIS — R197 Diarrhea, unspecified: Secondary | ICD-10-CM | POA: Diagnosis not present

## 2015-04-23 DIAGNOSIS — J449 Chronic obstructive pulmonary disease, unspecified: Secondary | ICD-10-CM | POA: Diagnosis not present

## 2015-04-23 DIAGNOSIS — R634 Abnormal weight loss: Secondary | ICD-10-CM | POA: Diagnosis not present

## 2015-04-23 DIAGNOSIS — E785 Hyperlipidemia, unspecified: Secondary | ICD-10-CM | POA: Diagnosis not present

## 2015-04-23 DIAGNOSIS — E559 Vitamin D deficiency, unspecified: Secondary | ICD-10-CM | POA: Diagnosis not present

## 2015-04-24 DIAGNOSIS — R197 Diarrhea, unspecified: Secondary | ICD-10-CM | POA: Diagnosis not present

## 2015-04-26 ENCOUNTER — Telehealth: Payer: Self-pay | Admitting: Emergency Medicine

## 2015-04-26 NOTE — Telephone Encounter (Signed)
Error need medical rec.Kathy Skinner

## 2015-04-29 ENCOUNTER — Ambulatory Visit (HOSPITAL_BASED_OUTPATIENT_CLINIC_OR_DEPARTMENT_OTHER): Payer: Medicare Other | Attending: Emergency Medicine

## 2015-04-29 DIAGNOSIS — J449 Chronic obstructive pulmonary disease, unspecified: Secondary | ICD-10-CM | POA: Diagnosis not present

## 2015-04-29 DIAGNOSIS — I493 Ventricular premature depolarization: Secondary | ICD-10-CM | POA: Insufficient documentation

## 2015-04-29 DIAGNOSIS — G4730 Sleep apnea, unspecified: Secondary | ICD-10-CM | POA: Diagnosis present

## 2015-04-29 DIAGNOSIS — G4710 Hypersomnia, unspecified: Secondary | ICD-10-CM

## 2015-04-29 DIAGNOSIS — G4734 Idiopathic sleep related nonobstructive alveolar hypoventilation: Secondary | ICD-10-CM | POA: Diagnosis not present

## 2015-04-29 DIAGNOSIS — R0683 Snoring: Secondary | ICD-10-CM | POA: Diagnosis not present

## 2015-05-01 ENCOUNTER — Ambulatory Visit: Payer: Medicare Other | Admitting: Emergency Medicine

## 2015-05-01 ENCOUNTER — Other Ambulatory Visit: Payer: Self-pay | Admitting: Emergency Medicine

## 2015-05-01 ENCOUNTER — Encounter (HOSPITAL_BASED_OUTPATIENT_CLINIC_OR_DEPARTMENT_OTHER): Payer: Medicare Other | Admitting: Pulmonary Disease

## 2015-05-01 DIAGNOSIS — J449 Chronic obstructive pulmonary disease, unspecified: Secondary | ICD-10-CM

## 2015-05-01 DIAGNOSIS — G4739 Other sleep apnea: Secondary | ICD-10-CM

## 2015-05-01 NOTE — Progress Notes (Signed)
Patient Name: Kathy Skinner, Kathy Skinner Date: 04/29/2015 Gender: Female D.O.B: March 14, 1946 Age (years): 68 Referring Provider: Baltazar Apo Height (inches): 60 Interpreting Physician: Chesley Mires MD,ABSM Weight (lbs): 160 RPSGT: Joni Reining BMI: 31 MRN: [id number] Neck Size: 13.00  CLINICAL INFORMATION Sleep Study Type: NPSG Indication for sleep study: Witnessed Apneas Epworth Sleepiness Score: 6  SLEEP STUDY TECHNIQUE As per the AASM Manual for the Scoring of Sleep and Associated Events v2.3 (April 2016) with a hypopnea requiring 4% desaturations. The channels recorded and monitored were frontal, central and occipital EEG, electrooculogram (EOG), submentalis EMG (chin), nasal and oral airflow, thoracic and abdominal wall motion, anterior tibialis EMG, snore microphone, electrocardiogram, and pulse oximetry.  MEDICATIONS Patient's medications include: reviewed in electronic medical records. Medications self-administered by patient during sleep study : No sleep medicine administered.  SLEEP ARCHITECTURE The study was initiated at 9:59:23 PM and ended at 5:05:12 AM. Sleep onset time was 33.9 minutes and the sleep efficiency was 81.4%. The total sleep time was 346.5 minutes. Stage REM latency was 364.5 minutes. The patient spent 6.93% of the night in stage N1 sleep, 81.96% in stage N2 sleep, 3.32% in stage N3 and 7.79% in REM. Alpha intrusion was absent. Supine sleep was 4.91%.  RESPIRATORY PARAMETERS The overall apnea/hypopnea index (AHI) was 0.0 per hour. There were 0 total apneas, including 0 obstructive, 0 central and 0 mixed apneas. There were 0 hypopneas and 1 RERAs. The AHI during Stage REM sleep was 0.0 per hour. AHI while supine was 0.0 per hour. The mean oxygen saturation was 90.15%. The minimum SpO2 during sleep was 84.00%. She spent 69.4 minutes with an oxygen saturation below 88%. Moderate snoring was noted during this study.  CARDIAC DATA The 2 lead EKG demonstrated  sinus rhythm. The mean heart rate was 66.52 beats per minute. Other EKG findings include: PVCs.  LEG MOVEMENT DATA The total PLMS were 94 with a resulting PLMS index of 16.28. Associated arousal with leg movement index was 0.3 . IMPRESSIONS She had significant oxygen desaturation during sleep in the absence of obstructive or central apneic events. This is consistent with sleep related hypoxia/hypoventilation. Of note is that she has a history of GOLD 3 COPD.  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   [Electronicallysigned] 05/01/2015 09:43 AM Chesley Mires MD, ABSM Diplomate, American Board of Sleep Medicine NPI: [id number]

## 2015-05-04 ENCOUNTER — Ambulatory Visit (HOSPITAL_COMMUNITY)
Admission: RE | Admit: 2015-05-04 | Discharge: 2015-05-04 | Disposition: A | Discharge status: Home / Self Care | Payer: Medicare Other | Source: Ambulatory Visit | Attending: Emergency Medicine | Admitting: Emergency Medicine

## 2015-05-04 ENCOUNTER — Encounter (HOSPITAL_COMMUNITY): Payer: Medicare Other

## 2015-05-04 DIAGNOSIS — J449 Chronic obstructive pulmonary disease, unspecified: Secondary | ICD-10-CM | POA: Diagnosis not present

## 2015-05-04 LAB — PULMONARY FUNCTION TEST
DL/VA % pred: 72 %
DL/VA: 3.28 ml/min/mmHg/L
DLCO UNC % PRED: 58 %
DLCO unc: 12.63 ml/min/mmHg
FEF 25-75 PRE: 0.5 L/s
FEF 25-75 Post: 0.66 L/sec
FEF2575-%Change-Post: 32 %
FEF2575-%PRED-POST: 34 %
FEF2575-%PRED-PRE: 25 %
FEV1-%Change-Post: 7 %
FEV1-%PRED-POST: 64 %
FEV1-%Pred-Pre: 59 %
FEV1-Post: 1.41 L
FEV1-Pre: 1.31 L
FEV1FVC-%CHANGE-POST: 8 %
FEV1FVC-%PRED-PRE: 61 %
FEV6-%CHANGE-POST: 0 %
FEV6-%Pred-Post: 93 %
FEV6-%Pred-Pre: 93 %
FEV6-Post: 2.56 L
FEV6-Pre: 2.55 L
FEV6FVC-%Change-Post: 0 %
FEV6FVC-%Pred-Post: 96 %
FEV6FVC-%Pred-Pre: 97 %
FVC-%CHANGE-POST: 0 %
FVC-%PRED-POST: 96 %
FVC-%Pred-Pre: 96 %
FVC-Post: 2.75 L
FVC-Pre: 2.77 L
POST FEV1/FVC RATIO: 51 %
PRE FEV1/FVC RATIO: 47 %
Post FEV6/FVC ratio: 93 %
Pre FEV6/FVC Ratio: 94 %
RV % pred: 156 %
RV: 3.15 L
TLC % pred: 123 %
TLC: 5.87 L

## 2015-05-04 MED ORDER — ALBUTEROL SULFATE (2.5 MG/3ML) 0.083% IN NEBU
2.5000 mg | INHALATION_SOLUTION | Freq: Once | RESPIRATORY_TRACT | Status: AC
  Administered 2015-05-04: 2.5 mg via RESPIRATORY_TRACT

## 2015-05-10 ENCOUNTER — Encounter: Payer: Self-pay | Admitting: Emergency Medicine

## 2015-05-10 ENCOUNTER — Ambulatory Visit (INDEPENDENT_AMBULATORY_CARE_PROVIDER_SITE_OTHER): Payer: Medicare Other | Admitting: Emergency Medicine

## 2015-05-10 VITALS — BP 138/90 | HR 92 | Ht 60.0 in | Wt 158.0 lb

## 2015-05-10 DIAGNOSIS — J449 Chronic obstructive pulmonary disease, unspecified: Secondary | ICD-10-CM

## 2015-05-10 NOTE — Progress Notes (Signed)
Subjective:    Patient ID: Kathy Skinner, female    DOB: [birth year], 69 y.o.   MRN: [id number]  HPI 69 yo former smoker (53 pk-yrs), hx COPD dx in ~2004, also carries dx ? asthma although suspect mostly COPD. Anxiety and panic attacks. Has been maintained on Advair + Spiriva for years.  She uses albuterol prn, for the last 3-4 weeks has used 4x a day.  She has been having more SOB at rest and w exertion. More secretions and cough.  Has baseline daily wheezing. She is on zyrtec + singulair. She has not tolerated nasal sprays in the past.   She is on chantix recently, has been on for several year off and on that she is using at times when she feels that she is going to restart smoking.   [name] 5/'14 at PCP:  FVC 2.0L 68 % FEV1 1.2L 49% Ratio 58%  ROV 67/11/14 -- f/u for COPD and allergies.  Last time added zyrtec daily, continued spiriva and advair, but she ran out of the spiriva 4-5 weeks ago.  She states that her breathing has been worse since last time, using nebs more frequently. She has panic attacks that keep her from going outside to exercise. When she does so she is more SOB. She is having more cough, more mucous.    ROV 06/30/13 -- f/u for COPD and allergies.  Last time continued Advair, added Tudorza bid. She believes that it is helping her. Has not needed SABA since last time. She describes a MSK discomfort in her chest. She has also had some cough. Needs zyrtec and prilosec. Remains on singulair.   ROV 11/02/14 -- follow-up visit for COPD, allergic rhinitis. She hasn't been seen since 2014. Her current regimen is Advair + Caprice Renshaw, but she usually dos not take at night. She reports that since last time she has had an eval for wt loss, also for ulnar radiculopathy. Has been stable from a breathing standpoint. She remains on zyrtec singulair.   ROV 12/19/14 -- follow up for COPD and allergic rhinitis. We have tried to get Caprice Renshaw for her - she benefited from it, but medicare won't cover it.  She is worse over the last 3 weeks when she had to stop the [place]. They do have Incruse on their formulary. If we aren't able to get Tudorza then we will switch to this. She believes the Advair helps her.   She is back on incruse, notes that she has has diarrhea but that her stools are improving. She also notes that she has gotten blurry vision while on. She has continued to have the sx. She sees Dr Satira Sark in optho, has had corneal transplants, other procedures.   ROV 02/13/15 -- follow up visit for COPD and allergic rhinitis. After last visit we continue Advair and Incruse. She was to speak with her ophthalmologist about the potential impact of this medication on her eyes  - her eye pressures and evaluation were reassuring. She has benefited some, but does not feels as good as she did when she was on the [place]. She tells me today that she is concerned about some intermittent palmar erythema, raised veins.   She is waking with HA, taking naps. Some unintentional naps.   ROV 05/10/15 -- Acute OV for her COPD. Saw Dr Melvyn Novas in 7/'16, at that visit trial > she stopped incruse, went back on Tudorza bid. Also stopped Advair, has helped her throat. She states that the tudorza has helped her but  still limited. She has PND + cough, exertional SOB. Has been using Dayquil with good results.  Underwent PSG that I have reviewed 8/30 > no OSA but documented desats (no Central apneas). PFT were performed 9/2 and have personally reviewed. Show severe obstruction 1.31 L or 59% of predicted, no bronchodilator response, hyperinflated lung volumes.  She remains on zyrtec, flonase, singulair.   Review of Systems  Constitutional: Negative for fever and unexpected weight change.  HENT: Positive for congestion, postnasal drip, rhinorrhea and sneezing. Negative for dental problem, ear pain, nosebleeds, sinus pressure, sore throat and trouble swallowing.   Eyes: Negative for redness, itching and visual disturbance.   Respiratory: Positive for cough and shortness of breath. Negative for chest tightness and wheezing.   Cardiovascular: Negative for palpitations and leg swelling.  Gastrointestinal: Negative for nausea and vomiting.  Genitourinary: Negative for dysuria.  Musculoskeletal: Negative for joint swelling.  Skin: Negative for rash.  Neurological: Negative for dizziness and headaches.  Hematological: Does not bruise/bleed easily.  Psychiatric/Behavioral: Negative for dysphoric mood. The patient is not nervous/anxious.        Objective:   Physical Exam Filed Vitals:   05/10/15 0901  BP: 138/90  Pulse: 92  Height: 5' (1.524 m)  Weight: 158 lb (71.668 kg)  SpO2: 93%   Gen: Pleasant, overwt, in no distress,  normal affect  ENT: No lesions,  mouth clear,  oropharynx clear, no postnasal drip  Neck: No JVD, no TMG, no carotid bruits, mild exp stridor  Lungs: No use of accessory muscles, diffuse B coarse exp wheezes.   Cardiovascular: RRR, heart sounds normal, no murmur or gallops, no peripheral edema  Musculoskeletal: No deformities, no cyanosis or clubbing  Neuro: alert, non focal  Skin: Warm, no lesions or rashes       Assessment & Plan:  COPD GOLD III Severe obstruction on pulmonary function testing now with nocturnal hypoxemia identified on her sleep study. She believes that she may have benefited from the re-addition of Tudorza, but I believe she also will need a beta agonist. We will do another trial change to an oral to see if she benefits. Start nocturnal oxygen at 2 L/m. All follow with her 1 month to assess her status on the new medicine  Allergic rhinitis We will continue her current regimen of fluticasone , Zyrtec, Singulair

## 2015-05-10 NOTE — Assessment & Plan Note (Signed)
Severe obstruction on pulmonary function testing now with nocturnal hypoxemia identified on her sleep study. She believes that she may have benefited from the re-addition of Tudorza, but I believe she also will need a beta agonist. We will do another trial change to an oral to see if she benefits. Start nocturnal oxygen at 2 L/m. All follow with her 1 month to assess her status on the new medicine

## 2015-05-10 NOTE — Patient Instructions (Addendum)
We will start oxygen at night while you are sleeping.  We will do a trial of stopping your Caprice Renshaw, starting Anoro once a day to see if you benefit.  Follow with Dr Lamonte Sakai in 1 month

## 2015-05-10 NOTE — Assessment & Plan Note (Signed)
We will continue her current regimen of fluticasone , Zyrtec, Singulair

## 2015-05-24 DIAGNOSIS — R634 Abnormal weight loss: Secondary | ICD-10-CM | POA: Diagnosis not present

## 2015-05-24 DIAGNOSIS — R350 Frequency of micturition: Secondary | ICD-10-CM | POA: Diagnosis not present

## 2015-05-24 DIAGNOSIS — Z23 Encounter for immunization: Secondary | ICD-10-CM | POA: Diagnosis not present

## 2015-05-24 DIAGNOSIS — J449 Chronic obstructive pulmonary disease, unspecified: Secondary | ICD-10-CM | POA: Diagnosis not present

## 2015-05-24 DIAGNOSIS — E559 Vitamin D deficiency, unspecified: Secondary | ICD-10-CM | POA: Diagnosis not present

## 2015-05-31 ENCOUNTER — Telehealth: Payer: Self-pay | Admitting: Emergency Medicine

## 2015-05-31 NOTE — Telephone Encounter (Signed)
Per 05/10/15 OV: Patient Instructions       We will start oxygen at night while you are sleeping.   We will do a trial of stopping your Caprice Renshaw, starting Anoro once a day to see if you benefit.  Follow with Dr Lamonte Sakai in 1 month     ----  Spoke with pt. She reports the anoro seems to be working "okay". She does feel it works a little better than tudorza but wants sample to try this again for another month. Please advise RB

## 2015-05-31 NOTE — Telephone Encounter (Signed)
This would be ok with me

## 2015-05-31 NOTE — Telephone Encounter (Signed)
Called spoke with pt. anoro samples left for pick up. Nothing further needed

## 2015-06-09 DIAGNOSIS — R05 Cough: Secondary | ICD-10-CM | POA: Diagnosis not present

## 2015-06-09 DIAGNOSIS — J209 Acute bronchitis, unspecified: Secondary | ICD-10-CM | POA: Diagnosis not present

## 2015-06-09 DIAGNOSIS — R062 Wheezing: Secondary | ICD-10-CM | POA: Diagnosis not present

## 2015-06-14 ENCOUNTER — Encounter: Payer: Self-pay | Admitting: Emergency Medicine

## 2015-06-14 ENCOUNTER — Ambulatory Visit (INDEPENDENT_AMBULATORY_CARE_PROVIDER_SITE_OTHER): Payer: Medicare Other | Admitting: Emergency Medicine

## 2015-06-14 VITALS — BP 142/98 | HR 79 | Ht 60.0 in | Wt 162.4 lb

## 2015-06-14 DIAGNOSIS — J309 Allergic rhinitis, unspecified: Secondary | ICD-10-CM | POA: Diagnosis not present

## 2015-06-14 DIAGNOSIS — J441 Chronic obstructive pulmonary disease with (acute) exacerbation: Secondary | ICD-10-CM | POA: Diagnosis not present

## 2015-06-14 DIAGNOSIS — J449 Chronic obstructive pulmonary disease, unspecified: Secondary | ICD-10-CM | POA: Diagnosis not present

## 2015-06-14 MED ORDER — PANTOPRAZOLE SODIUM 40 MG PO TBEC: 40.0000 mg | DELAYED_RELEASE_TABLET | Freq: Every day | ORAL | Status: DC

## 2015-06-14 MED ORDER — PREDNISONE 10 MG PO TABS: ORAL_TABLET | ORAL | Status: DC

## 2015-06-14 NOTE — Assessment & Plan Note (Addendum)
I suspect that GERD is contributing to continued sx and sustained / recurrent exacerbations. Will continue her singulair, fluticasone. Add empiric PPI. Continue anoro. pred taper for continued bronchospasm on exam. F/u in 1 month to assess improvement   Please start nexium 40mg  daily Tale prednisone 40mg  daily for 3 days, then 30mg  daily for 3 days, then 20mg  daily for 3 days, then 10mg  daily for 3 days, then stop Continue singulair and fluticasone nasal spray  Continue Anoro daily Use albuterol 2 puffs up to every 4 hours if needed for shortness of breath.  Follow with Dr Lamonte Sakai in 1 month

## 2015-06-14 NOTE — Assessment & Plan Note (Signed)
She is flaring, frequently flares. Appeared to tolerate (? Benefit) from the Anoro. Will continue the Anoro.

## 2015-06-14 NOTE — Assessment & Plan Note (Signed)
singulair and fluticasone nasal spray

## 2015-06-14 NOTE — Patient Instructions (Addendum)
Please start pantoprazole 40mg  daily Tale prednisone 40mg  daily for 3 days, then 30mg  daily for 3 days, then 20mg  daily for 3 days, then 10mg  daily for 3 days, then stop Continue singulair and fluticasone nasal spray  Continue Anoro daily Use albuterol 2 puffs up to every 4 hours if needed for shortness of breath.  Follow with Dr Lamonte Sakai in 1 month

## 2015-06-14 NOTE — Progress Notes (Signed)
Subjective:    Patient ID: Kathy Skinner, female    DOB: 06-15-1946, 69 y.o.   MRN: [id number]  HPI 69 yo former smoker (28 pk-yrs), hx COPD dx in ~2004, also carries dx ? asthma although suspect mostly COPD. Anxiety and panic attacks. Has been maintained on Advair + Spiriva for years.  She uses albuterol prn, for the last 3-4 weeks has used 4x a day.  She has been having more SOB at rest and w exertion. More secretions and cough.  Has baseline daily wheezing. She is on zyrtec + singulair. She has not tolerated nasal sprays in the past.   She is on chantix recently, has been on for several year off and on that she is using at times when she feels that she is going to restart smoking.   Kathy Skinner 5/'14 at PCP:  FVC 2.0L 68 % FEV1 1.2L 49% Ratio 58%  ROV 05/12/13 -- f/u for COPD and allergies.  Last time added zyrtec daily, continued spiriva and advair, but she ran out of the spiriva 4-5 weeks ago.  She states that her breathing has been worse since last time, using nebs more frequently. She has panic attacks that keep her from going outside to exercise. When she does so she is more SOB. She is having more cough, more mucous.    ROV 06/30/13 -- f/u for COPD and allergies.  Last time continued Advair, added Tudorza bid. She believes that it is helping her. Has not needed SABA since last time. She describes a MSK discomfort in her chest. She has also had some cough. Needs zyrtec and prilosec. Remains on singulair.   ROV 11/02/14 -- follow-up visit for COPD, allergic rhinitis. She hasn't been seen since 2014. Her current regimen is Advair + Caprice Renshaw, but she usually dos not take at night. She reports that since last time she has had an eval for wt loss, also for ulnar radiculopathy. Has been stable from a breathing standpoint. She remains on zyrtec singulair.   ROV 12/19/14 -- follow up for COPD and allergic rhinitis. We have tried to get Caprice Renshaw for her - she benefited from it, but medicare won't cover it.  She is worse over the last 3 weeks when she had to stop the [place]. They do have Incruse on their formulary. If we aren't able to get Tudorza then we will switch to this. She believes the Advair helps her.   She is back on incruse, notes that she has has diarrhea but that her stools are improving. She also notes that she has gotten blurry vision while on. She has continued to have the sx. She sees Dr Satira Sark in optho, has had corneal transplants, other procedures.   ROV 02/13/15 -- follow up visit for COPD and allergic rhinitis. After last visit we continue Advair and Incruse. She was to speak with her ophthalmologist about the potential impact of this medication on her eyes  - her eye pressures and evaluation were reassuring. She has benefited some, but does not feels as good as she did when she was on the [place]. She tells me today that she is concerned about some intermittent palmar erythema, raised veins.   She is waking with HA, taking naps. Some unintentional naps.   ROV 05/10/15 -- Acute OV for her COPD. Saw Dr Melvyn Novas in 7/'16, at that visit trial > she stopped incruse, went back on Tudorza bid. Also stopped Advair, has helped her throat. She states that the tudorza has helped her but  still limited. She has PND + cough, exertional SOB. Has been using Dayquil with good results.  Underwent PSG that I have reviewed 8/30 > no OSA but documented desats (no Central apneas). PFT were performed 9/2 and have personally reviewed. Show severe obstruction 1.31 L or 59% of predicted, no bronchodilator response, hyperinflated lung volumes.  She remains on zyrtec, flonase, singulair.   ROV 06/14/15 -- follow-up visit for severe COPD, nocturnal hypoxemia. At her last visit we tried stopping [place], replacing with Anoro. She has continued to have cough with associated back pain and L shoulder pain. She has been waking up SOB and w cough. Using SABA frequently. These sx are similar to last visit but have progressed.  She has seen her PCP and gone to walk-in clinic. Started azithro, minimal improvement.  She is on flonase, singulair.  Ice water seems to help her cough.     Review of Systems  Constitutional: Negative for fever and unexpected weight change.  HENT: Positive for congestion. Negative for dental problem, ear pain, nosebleeds, postnasal drip, rhinorrhea, sinus pressure, sneezing, sore throat and trouble swallowing.   Eyes: Negative for redness, itching and visual disturbance.  Respiratory: Positive for cough and shortness of breath. Negative for chest tightness and wheezing.   Cardiovascular: Negative for palpitations and leg swelling.  Gastrointestinal: Negative for nausea and vomiting.  Genitourinary: Negative for dysuria.  Musculoskeletal: Negative for joint swelling.  Skin: Negative for rash.  Neurological: Negative for dizziness and headaches.  Hematological: Does not bruise/bleed easily.  Psychiatric/Behavioral: Negative for dysphoric mood. The patient is not nervous/anxious.        Objective:   Physical Exam Filed Vitals:   06/14/15 1647  BP: 142/98  Pulse: 79  Height: 5' (1.524 m)  Weight: 162 lb 6.4 oz (73.664 kg)  SpO2: 93%   Gen: Pleasant, overwt, in no distress,  normal affect  ENT: No lesions,  mouth clear,  oropharynx clear, no postnasal drip  Neck: No JVD, no TMG, no carotid bruits, mild exp stridor  Lungs: No use of accessory muscles, diffuse B coarse exp wheezes.   Cardiovascular: RRR, heart sounds normal, no murmur or gallops, no peripheral edema  Musculoskeletal: No deformities, no cyanosis or clubbing  Neuro: alert, non focal  Skin: Warm, no lesions or rashes       Assessment & Plan:  COPD exacerbation I suspect that GERD is contributing to continued sx and sustained / recurrent exacerbations. Will continue her singulair, fluticasone. Add empiric PPI. Continue anoro. pred taper for continued bronchospasm on exam. F/u in 1 month to assess improvement    Please start nexium 40mg  daily Tale prednisone 40mg  daily for 3 days, then 30mg  daily for 3 days, then 20mg  daily for 3 days, then 10mg  daily for 3 days, then stop Continue singulair and fluticasone nasal spray  Continue Anoro daily Use albuterol 2 puffs up to every 4 hours if needed for shortness of breath.  Follow with Dr Lamonte Sakai in 1 month  Allergic rhinitis singulair and fluticasone nasal spray  COPD GOLD III She is flaring, frequently flares. Appeared to tolerate (? Benefit) from the Anoro. Will continue the Anoro.

## 2015-06-29 ENCOUNTER — Telehealth: Payer: Self-pay | Admitting: Emergency Medicine

## 2015-06-29 MED ORDER — UMECLIDINIUM-VILANTEROL 62.5-25 MCG/INH IN AEPB: 1.0000 | INHALATION_SPRAY | Freq: Every day | RESPIRATORY_TRACT | Status: DC

## 2015-06-29 NOTE — Telephone Encounter (Signed)
Patient says that she finished the prednisone and she feels a lot better.  Pt requesting refill on Anoro.   [facility]ill sent to [facility] per pts request. Patient notified. Nothing further needed.

## 2015-07-12 DIAGNOSIS — R197 Diarrhea, unspecified: Secondary | ICD-10-CM | POA: Diagnosis not present

## 2015-07-16 DIAGNOSIS — R197 Diarrhea, unspecified: Secondary | ICD-10-CM | POA: Diagnosis not present

## 2015-07-18 ENCOUNTER — Encounter: Payer: Self-pay | Admitting: Emergency Medicine

## 2015-07-18 ENCOUNTER — Ambulatory Visit (INDEPENDENT_AMBULATORY_CARE_PROVIDER_SITE_OTHER): Payer: Medicare Other | Admitting: Emergency Medicine

## 2015-07-18 VITALS — BP 140/80 | HR 80 | Ht 60.0 in | Wt 152.0 lb

## 2015-07-18 DIAGNOSIS — J309 Allergic rhinitis, unspecified: Secondary | ICD-10-CM

## 2015-07-18 DIAGNOSIS — J449 Chronic obstructive pulmonary disease, unspecified: Secondary | ICD-10-CM | POA: Diagnosis not present

## 2015-07-18 NOTE — Assessment & Plan Note (Signed)
Continue her same regimen

## 2015-07-18 NOTE — Assessment & Plan Note (Signed)
She has daily symptoms but does appear to improved since initiating Anoro. We will continue this regimen as well as aggressive allergy routine. She does not believe that GERD is a significant contributor and does not want to stay on pantoprazole

## 2015-07-18 NOTE — Patient Instructions (Addendum)
Please continue your Anoro daily Keep albuterol available to use as needed Continue singulair, fluticasone nasal spray.  Please continue to use you oxygen while sleeping.  Follow with Dr Lamonte Sakai in 6 months or sooner if you have any problems

## 2015-07-18 NOTE — Progress Notes (Signed)
Subjective:    Patient ID: Kathy Skinner, female    DOB: 06-15-1946, 69 y.o.   MRN: [id number]  HPI 69 yo former smoker (28 pk-yrs), hx COPD dx in ~2004, also carries dx ? asthma although suspect mostly COPD. Anxiety and panic attacks. Has been maintained on Advair + Spiriva for years.  She uses albuterol prn, for the last 3-4 weeks has used 4x a day.  She has been having more SOB at rest and w exertion. More secretions and cough.  Has baseline daily wheezing. She is on zyrtec + singulair. She has not tolerated nasal sprays in the past.   She is on chantix recently, has been on for several year off and on that she is using at times when she feels that she is going to restart smoking.   [name] 5/'14 at PCP:  FVC 2.0L 68 % FEV1 1.2L 49% Ratio 58%  ROV 05/12/13 -- f/u for COPD and allergies.  Last time added zyrtec daily, continued spiriva and advair, but she ran out of the spiriva 4-5 weeks ago.  She states that her breathing has been worse since last time, using nebs more frequently. She has panic attacks that keep her from going outside to exercise. When she does so she is more SOB. She is having more cough, more mucous.    ROV 06/30/13 -- f/u for COPD and allergies.  Last time continued Advair, added Tudorza bid. She believes that it is helping her. Has not needed SABA since last time. She describes a MSK discomfort in her chest. She has also had some cough. Needs zyrtec and prilosec. Remains on singulair.   ROV 11/02/14 -- follow-up visit for COPD, allergic rhinitis. She hasn't been seen since 2014. Her current regimen is Advair + Caprice Renshaw, but she usually dos not take at night. She reports that since last time she has had an eval for wt loss, also for ulnar radiculopathy. Has been stable from a breathing standpoint. She remains on zyrtec singulair.   ROV 12/19/14 -- follow up for COPD and allergic rhinitis. We have tried to get Caprice Renshaw for her - she benefited from it, but medicare won't cover it.  She is worse over the last 3 weeks when she had to stop the [place]. They do have Incruse on their formulary. If we aren't able to get Tudorza then we will switch to this. She believes the Advair helps her.   She is back on incruse, notes that she has has diarrhea but that her stools are improving. She also notes that she has gotten blurry vision while on. She has continued to have the sx. She sees Dr Satira Sark in optho, has had corneal transplants, other procedures.   ROV 02/13/15 -- follow up visit for COPD and allergic rhinitis. After last visit we continue Advair and Incruse. She was to speak with her ophthalmologist about the potential impact of this medication on her eyes  - her eye pressures and evaluation were reassuring. She has benefited some, but does not feels as good as she did when she was on the [place]. She tells me today that she is concerned about some intermittent palmar erythema, raised veins.   She is waking with HA, taking naps. Some unintentional naps.   ROV 05/10/15 -- Acute OV for her COPD. Saw Dr Melvyn Novas in 7/'16, at that visit trial > she stopped incruse, went back on Tudorza bid. Also stopped Advair, has helped her throat. She states that the tudorza has helped her but  still limited. She has PND + cough, exertional SOB. Has been using Dayquil with good results.  Underwent PSG that I have reviewed 8/30 > no OSA but documented desats (no Central apneas). PFT were performed 9/2 and have personally reviewed. Show severe obstruction 1.31 L or 59% of predicted, no bronchodilator response, hyperinflated lung volumes.  She remains on zyrtec, flonase, singulair.   ROV 06/14/15 -- follow-up visit for severe COPD, nocturnal hypoxemia. At her last visit we tried stopping [place], replacing with Anoro. She has continued to have cough with associated back pain and L shoulder pain. She has been waking up SOB and w cough. Using SABA frequently. These sx are similar to last visit but have progressed.  She has seen her PCP and gone to walk-in clinic. Started azithro, minimal improvement.  She is on flonase, singulair.  Ice water seems to help her cough.    ROV 07/18/15 -- follow-up visit for severe COPD and chronic hypoxemic respiratory failure requiring nocturnal oxygen. At our last visit we continued anoro, treated her for a flare with prednisone. She improved on this. I suspect that her GERD is contributing to her frequent exacerbations and we added an empiric pantoprazole 40 mg daily - she finished it 2 days ago.  She has seen Dr Michail Sermon, is starting pro-biotics.  Has stable dyspnea with exertion for > 30 minutes. Changing temperature bothers her breathing as well.   CAT Score 11/02/2014 02/03/2013  Total CAT Score 30 32     Review of Systems  Constitutional: Negative for fever and unexpected weight change.  HENT: Positive for congestion. Negative for dental problem, ear pain, nosebleeds, postnasal drip, rhinorrhea, sinus pressure, sneezing, sore throat and trouble swallowing.   Eyes: Negative for redness, itching and visual disturbance.  Respiratory: Positive for cough and shortness of breath. Negative for chest tightness and wheezing.   Cardiovascular: Negative for palpitations and leg swelling.  Gastrointestinal: Negative for nausea and vomiting.  Genitourinary: Negative for dysuria.  Musculoskeletal: Negative for joint swelling.  Skin: Negative for rash.  Neurological: Negative for dizziness and headaches.  Hematological: Does not bruise/bleed easily.  Psychiatric/Behavioral: Negative for dysphoric mood. The patient is not nervous/anxious.        Objective:   Physical Exam Filed Vitals:   07/18/15 1506  BP: 140/80  Pulse: 80  Height: 5' (1.524 m)  Weight: 152 lb (68.947 kg)  SpO2: 94%   Gen: Pleasant, overwt, in no distress,  normal affect  ENT: No lesions,  mouth clear,  oropharynx clear, no postnasal drip  Neck: No JVD, no TMG, no carotid bruits, mild exp  stridor  Lungs: No use of accessory muscles, diffuse B coarse exp wheezes. Unchanged  Cardiovascular: RRR, heart sounds normal, no murmur or gallops, no peripheral edema  Musculoskeletal: No deformities, no cyanosis or clubbing  Neuro: alert, non focal  Skin: Warm, no lesions or rashes       Assessment & Plan:  COPD GOLD III She has daily symptoms but does appear to improved since initiating Anoro. We will continue this regimen as well as aggressive allergy routine. She does not believe that GERD is a significant contributor and does not want to stay on pantoprazole  Allergic rhinitis Continue her same regimen

## 2015-07-23 DIAGNOSIS — F322 Major depressive disorder, single episode, severe without psychotic features: Secondary | ICD-10-CM | POA: Diagnosis not present

## 2015-07-31 ENCOUNTER — Other Ambulatory Visit: Payer: Self-pay | Admitting: *Deleted

## 2015-07-31 MED ORDER — ALBUTEROL SULFATE (2.5 MG/3ML) 0.083% IN NEBU: 2.5000 mg | INHALATION_SOLUTION | Freq: Four times a day (QID) | RESPIRATORY_TRACT | Status: DC | PRN

## 2015-09-18 ENCOUNTER — Other Ambulatory Visit: Payer: Self-pay | Admitting: *Deleted

## 2015-09-18 MED ORDER — MONTELUKAST SODIUM 10 MG PO TABS: 10.0000 mg | ORAL_TABLET | Freq: Every morning | ORAL | Status: DC

## 2015-09-19 ENCOUNTER — Other Ambulatory Visit: Payer: Self-pay

## 2015-09-19 DIAGNOSIS — Z1231 Encounter for screening mammogram for malignant neoplasm of breast: Secondary | ICD-10-CM

## 2015-10-02 DIAGNOSIS — R197 Diarrhea, unspecified: Secondary | ICD-10-CM | POA: Diagnosis not present

## 2015-10-02 DIAGNOSIS — K589 Irritable bowel syndrome without diarrhea: Secondary | ICD-10-CM | POA: Diagnosis not present

## 2015-10-09 ENCOUNTER — Other Ambulatory Visit: Payer: Self-pay | Admitting: Emergency Medicine

## 2015-10-11 DIAGNOSIS — F322 Major depressive disorder, single episode, severe without psychotic features: Secondary | ICD-10-CM | POA: Diagnosis not present

## 2015-10-12 DIAGNOSIS — H43813 Vitreous degeneration, bilateral: Secondary | ICD-10-CM | POA: Diagnosis not present

## 2015-10-12 DIAGNOSIS — H52203 Unspecified astigmatism, bilateral: Secondary | ICD-10-CM | POA: Diagnosis not present

## 2015-10-12 DIAGNOSIS — H01001 Unspecified blepharitis right upper eyelid: Secondary | ICD-10-CM | POA: Diagnosis not present

## 2015-10-12 DIAGNOSIS — D3131 Benign neoplasm of right choroid: Secondary | ICD-10-CM | POA: Diagnosis not present

## 2015-10-17 ENCOUNTER — Telehealth: Payer: Self-pay | Admitting: Emergency Medicine

## 2015-10-17 NOTE — Telephone Encounter (Signed)
Received a refill request for Tylenol #3. This has never been prescribed by RB before.  RB - are you okay with refilling this? Thanks.

## 2015-10-18 NOTE — Telephone Encounter (Signed)
Called and spoke with pt. I explained to the patient that RB has not prescribed or refilled her Tylenol #3. Pt stated that she gets her Tylenol # 3 from Dr. Tamera Punt at [facility]. She stated that she will contact their office to inform them of needed refill. Pt is scheduled to see RB on 11/12/15 but request a sooner ov. I scheduled her with RB for 10/29/15 at 1:30 pm. She voiced understanding and had no further questions.  Called [facility] [address] and spoke with Maudie Mercury. I informed her that medication was not prescribed by our office and that it needs to go to [facility]. She stated she would fax refill request to [facility]. She voiced understanding and had no further questions. Nothing further needed at this time.

## 2015-10-18 NOTE — Telephone Encounter (Signed)
I don't recall every giving her this med. ? Is it for cough. I am willing to give her one time refill to treat cough but if this is a chronic med for her then we need to find out who has been filling it and defer to them

## 2015-10-23 DIAGNOSIS — E559 Vitamin D deficiency, unspecified: Secondary | ICD-10-CM | POA: Diagnosis not present

## 2015-10-23 DIAGNOSIS — E785 Hyperlipidemia, unspecified: Secondary | ICD-10-CM | POA: Diagnosis not present

## 2015-10-23 DIAGNOSIS — F411 Generalized anxiety disorder: Secondary | ICD-10-CM | POA: Diagnosis not present

## 2015-10-23 DIAGNOSIS — R197 Diarrhea, unspecified: Secondary | ICD-10-CM | POA: Diagnosis not present

## 2015-10-23 DIAGNOSIS — L309 Dermatitis, unspecified: Secondary | ICD-10-CM | POA: Diagnosis not present

## 2015-10-23 DIAGNOSIS — I779 Disorder of arteries and arterioles, unspecified: Secondary | ICD-10-CM | POA: Diagnosis not present

## 2015-10-23 DIAGNOSIS — M8580 Other specified disorders of bone density and structure, unspecified site: Secondary | ICD-10-CM | POA: Diagnosis not present

## 2015-10-26 DIAGNOSIS — R350 Frequency of micturition: Secondary | ICD-10-CM | POA: Diagnosis not present

## 2015-10-26 DIAGNOSIS — N3946 Mixed incontinence: Secondary | ICD-10-CM | POA: Diagnosis not present

## 2015-10-26 DIAGNOSIS — Z0000 Encounter for general adult medical examination without abnormal findings: Secondary | ICD-10-CM | POA: Diagnosis not present

## 2015-10-29 ENCOUNTER — Ambulatory Visit: Payer: Medicare Other | Admitting: Emergency Medicine

## 2015-11-09 ENCOUNTER — Other Ambulatory Visit: Payer: Self-pay | Admitting: Family Medicine

## 2015-11-09 DIAGNOSIS — F5105 Insomnia due to other mental disorder: Secondary | ICD-10-CM | POA: Diagnosis not present

## 2015-11-09 DIAGNOSIS — I779 Disorder of arteries and arterioles, unspecified: Secondary | ICD-10-CM

## 2015-11-09 DIAGNOSIS — I739 Peripheral vascular disease, unspecified: Principal | ICD-10-CM

## 2015-11-09 DIAGNOSIS — G3184 Mild cognitive impairment, so stated: Secondary | ICD-10-CM | POA: Diagnosis not present

## 2015-11-12 ENCOUNTER — Ambulatory Visit: Payer: Medicare Other | Admitting: Emergency Medicine

## 2015-11-15 ENCOUNTER — Ambulatory Visit
Admission: RE | Admit: 2015-11-15 | Discharge: 2015-11-15 | Disposition: A | Discharge status: Home / Self Care | Payer: Medicare Other | Source: Ambulatory Visit | Attending: Family Medicine | Admitting: Family Medicine

## 2015-11-15 DIAGNOSIS — I739 Peripheral vascular disease, unspecified: Principal | ICD-10-CM

## 2015-11-15 DIAGNOSIS — I6523 Occlusion and stenosis of bilateral carotid arteries: Secondary | ICD-10-CM | POA: Diagnosis not present

## 2015-11-15 DIAGNOSIS — I779 Disorder of arteries and arterioles, unspecified: Secondary | ICD-10-CM

## 2015-11-16 ENCOUNTER — Ambulatory Visit: Payer: Medicare Other | Admitting: Emergency Medicine

## 2015-12-04 DIAGNOSIS — Z0000 Encounter for general adult medical examination without abnormal findings: Secondary | ICD-10-CM | POA: Diagnosis not present

## 2015-12-04 DIAGNOSIS — R350 Frequency of micturition: Secondary | ICD-10-CM | POA: Diagnosis not present

## 2015-12-04 DIAGNOSIS — N3946 Mixed incontinence: Secondary | ICD-10-CM | POA: Diagnosis not present

## 2015-12-17 ENCOUNTER — Encounter: Payer: Self-pay | Admitting: Emergency Medicine

## 2015-12-17 ENCOUNTER — Ambulatory Visit (INDEPENDENT_AMBULATORY_CARE_PROVIDER_SITE_OTHER): Payer: Medicare Other | Admitting: Emergency Medicine

## 2015-12-17 VITALS — BP 116/80 | HR 69 | Ht 62.0 in | Wt 167.0 lb

## 2015-12-17 DIAGNOSIS — K219 Gastro-esophageal reflux disease without esophagitis: Secondary | ICD-10-CM

## 2015-12-17 DIAGNOSIS — J309 Allergic rhinitis, unspecified: Secondary | ICD-10-CM

## 2015-12-17 DIAGNOSIS — J449 Chronic obstructive pulmonary disease, unspecified: Secondary | ICD-10-CM | POA: Diagnosis not present

## 2015-12-17 NOTE — Patient Instructions (Addendum)
Please continue your Anoro every day Take albuterol 2 puffs up to every 4 hours if needed for shortness of breath.  Continue your singulair and fluticasone nasal spray as you have been taking them Follow with Dr Lamonte Sakai in 6 months or sooner if you have any problems

## 2015-12-17 NOTE — Progress Notes (Signed)
Subjective:    Patient ID: Kathy Skinner, female    DOB: 1946/02/03, 70 y.o.   MRN: [id number]  HPI 70 yo former smoker (63 pk-yrs), hx COPD dx in ~2004, also carries dx ? asthma although suspect mostly COPD. Anxiety and panic attacks. Has been maintained on Advair + Spiriva for years.  She uses albuterol prn, for the last 3-4 weeks has used 4x a day.  She has been having more SOB at rest and w exertion. More secretions and cough.  Has baseline daily wheezing. She is on zyrtec + singulair. She has not tolerated nasal sprays in the past.   She is on chantix recently, has been on for several year off and on that she is using at times when she feels that she is going to restart smoking.   [name] 5/'14 at PCP:  FVC 2.0L 68 % FEV1 1.2L 49% Ratio 58%  ROV 05/12/13 -- f/u for COPD and allergies.  Last time added zyrtec daily, continued spiriva and advair, but she ran out of the spiriva 4-5 weeks ago.  She states that her breathing has been worse since last time, using nebs more frequently. She has panic attacks that keep her from going outside to exercise. When she does so she is more SOB. She is having more cough, more mucous.    ROV 06/30/13 -- f/u for COPD and allergies.  Last time continued Advair, added Tudorza bid. She believes that it is helping her. Has not needed SABA since last time. She describes a MSK discomfort in her chest. She has also had some cough. Needs zyrtec and prilosec. Remains on singulair.   ROV 11/02/14 -- follow-up visit for COPD, allergic rhinitis. She hasn't been seen since 2014. Her current regimen is Advair + Caprice Renshaw, but she usually dos not take at night. She reports that since last time she has had an eval for wt loss, also for ulnar radiculopathy. Has been stable from a breathing standpoint. She remains on zyrtec singulair.   ROV 12/19/14 -- follow up for COPD and allergic rhinitis. We have tried to get Caprice Renshaw for her - she benefited from it, but medicare won't cover it.  She is worse over the last 3 weeks when she had to stop the [place]. They do have Incruse on their formulary. If we aren't able to get Tudorza then we will switch to this. She believes the Advair helps her.   She is back on incruse, notes that she has has diarrhea but that her stools are improving. She also notes that she has gotten blurry vision while on. She has continued to have the sx. She sees Dr Satira Sark in optho, has had corneal transplants, other procedures.   ROV 02/13/15 -- follow up visit for COPD and allergic rhinitis. After last visit we continue Advair and Incruse. She was to speak with her ophthalmologist about the potential impact of this medication on her eyes  - her eye pressures and evaluation were reassuring. She has benefited some, but does not feels as good as she did when she was on the [place]. She tells me today that she is concerned about some intermittent palmar erythema, raised veins.   She is waking with HA, taking naps. Some unintentional naps.   ROV 05/10/15 -- Acute OV for her COPD. Saw Dr Melvyn Novas in 7/'16, at that visit trial > she stopped incruse, went back on Tudorza bid. Also stopped Advair, has helped her throat. She states that the tudorza has helped her but  still limited. She has PND + cough, exertional SOB. Has been using Dayquil with good results.  Underwent PSG that I have reviewed 8/30 > no OSA but documented desats (no Central apneas). PFT were performed 9/2 and have personally reviewed. Show severe obstruction 1.31 L or 59% of predicted, no bronchodilator response, hyperinflated lung volumes.  She remains on zyrtec, flonase, singulair.   ROV 06/14/15 -- follow-up visit for severe COPD, nocturnal hypoxemia. At her last visit we tried stopping [place], replacing with Anoro. She has continued to have cough with associated back pain and L shoulder pain. She has been waking up SOB and w cough. Using SABA frequently. These sx are similar to last visit but have progressed.  She has seen her PCP and gone to walk-in clinic. Started azithro, minimal improvement.  She is on flonase, singulair.  Ice water seems to help her cough.    ROV 07/18/15 -- follow-up visit for severe COPD and chronic hypoxemic respiratory failure requiring nocturnal oxygen. At our last visit we continued anoro, treated her for a flare with prednisone. She improved on this. I suspect that her GERD is contributing to her frequent exacerbations and we added an empiric pantoprazole 40 mg daily - she finished it 2 days ago.  She has seen Dr Michail Sermon, is starting pro-biotics.  Has stable dyspnea with exertion for > 30 minutes. Changing temperature bothers her breathing as well.   ROV 12/17/15 -- patient with a history of very severe COPD and chronic hypoxemic respiratory failure. Last seen in November 2016.  She tells me that she has been fairly well except that her allergic rhinitis is more active right now - rhinorrhea, HA, some cough. She does cough when she wakes every am. Clear thick mucous. She is still on anoro, feels that it is benefiting. Her SABA use he decreased, about once a week. She does wheeze some. No ED visits, no flares.     CAT Score 11/02/2014 02/03/2013  Total CAT Score 30 32     Review of Systems  Constitutional: Negative for fever and unexpected weight change.  HENT: Positive for congestion. Negative for dental problem, ear pain, nosebleeds, postnasal drip, rhinorrhea, sinus pressure, sneezing, sore throat and trouble swallowing.   Eyes: Negative for redness, itching and visual disturbance.  Respiratory: Positive for cough and shortness of breath. Negative for chest tightness and wheezing.   Cardiovascular: Negative for palpitations and leg swelling.  Gastrointestinal: Negative for nausea and vomiting.  Genitourinary: Negative for dysuria.  Musculoskeletal: Negative for joint swelling.  Skin: Negative for rash.  Neurological: Negative for dizziness and headaches.  Hematological:  Does not bruise/bleed easily.  Psychiatric/Behavioral: Negative for dysphoric mood. The patient is not nervous/anxious.        Objective:   Physical Exam Filed Vitals:   12/17/15 1546  BP: 116/80  Pulse: 69  Height: 5\' 2"  (1.575 m)  Weight: 167 lb (75.751 kg)  SpO2: 93%   Gen: Pleasant, overwt, in no distress,  normal affect  ENT: No lesions,  mouth clear,  oropharynx clear, no postnasal drip  Neck: No JVD, no TMG, no carotid bruits, mild exp stridor  Lungs: No use of accessory muscles, diffuse B coarse exp wheezes. Unchanged  Cardiovascular: RRR, heart sounds normal, no murmur or gallops, no peripheral edema  Musculoskeletal: No deformities, no cyanosis or clubbing  Neuro: alert, non focal  Skin: Warm, no lesions or rashes       Assessment & Plan:  GERD (gastroesophageal reflux disease) She wants  to change back from her pantoprazole to omeprazole. She will go ahead and make this change.  Allergic rhinitis Continue Singulair and fluticasone nasal spray  COPD GOLD III Continue Anoro and albuterol prn

## 2015-12-17 NOTE — Assessment & Plan Note (Signed)
Continue Singulair and fluticasone nasal spray 

## 2015-12-17 NOTE — Assessment & Plan Note (Signed)
Continue Anoro and albuterol prn

## 2015-12-17 NOTE — Assessment & Plan Note (Signed)
She wants to change back from her pantoprazole to omeprazole. She will go ahead and make this change.

## 2015-12-20 ENCOUNTER — Telehealth: Payer: Self-pay | Admitting: Emergency Medicine

## 2015-12-20 NOTE — Telephone Encounter (Signed)
lmtcb x1 for pt. 

## 2015-12-21 MED ORDER — OMEPRAZOLE 20 MG PO CPDR: 20.0000 mg | DELAYED_RELEASE_CAPSULE | Freq: Every day | ORAL | Status: DC

## 2015-12-21 NOTE — Telephone Encounter (Signed)
Patient states that she discussed changing from Pantoprazole to Prilosec with Dr. Lamonte Sakai at last [place] and he said that would be fine to do.  She is requesting RX to be sent in for Prilosec. Per Dr. Agustina Caroli OV notes:   GERD (gastroesophageal reflux disease) - Collene Gobble, MD at 12/17/2015 4:14 PM     Status: Written Related Problem: GERD (gastroesophageal reflux disease)   Expand All Collapse All   She wants to change back from her pantoprazole to omeprazole. She will go ahead and make this change.         Rx sent to pharmacy Patient aware.

## 2015-12-21 NOTE — Addendum Note (Signed)
Addended by: Mathis Dad on: 12/21/2015 09:17 AM   Modules accepted: Orders, Medications

## 2016-01-03 DIAGNOSIS — F322 Major depressive disorder, single episode, severe without psychotic features: Secondary | ICD-10-CM | POA: Diagnosis not present

## 2016-01-29 ENCOUNTER — Other Ambulatory Visit: Payer: Self-pay | Admitting: Emergency Medicine

## 2016-02-01 DIAGNOSIS — L570 Actinic keratosis: Secondary | ICD-10-CM | POA: Diagnosis not present

## 2016-02-01 DIAGNOSIS — L821 Other seborrheic keratosis: Secondary | ICD-10-CM | POA: Diagnosis not present

## 2016-02-01 DIAGNOSIS — D485 Neoplasm of uncertain behavior of skin: Secondary | ICD-10-CM | POA: Diagnosis not present

## 2016-02-01 DIAGNOSIS — L814 Other melanin hyperpigmentation: Secondary | ICD-10-CM | POA: Diagnosis not present

## 2016-02-01 DIAGNOSIS — C44622 Squamous cell carcinoma of skin of right upper limb, including shoulder: Secondary | ICD-10-CM | POA: Diagnosis not present

## 2016-02-01 DIAGNOSIS — L239 Allergic contact dermatitis, unspecified cause: Secondary | ICD-10-CM | POA: Diagnosis not present

## 2016-02-01 DIAGNOSIS — Z85828 Personal history of other malignant neoplasm of skin: Secondary | ICD-10-CM | POA: Diagnosis not present

## 2016-02-01 DIAGNOSIS — C44329 Squamous cell carcinoma of skin of other parts of face: Secondary | ICD-10-CM | POA: Diagnosis not present

## 2016-02-28 DIAGNOSIS — C44329 Squamous cell carcinoma of skin of other parts of face: Secondary | ICD-10-CM | POA: Diagnosis not present

## 2016-02-28 DIAGNOSIS — C44622 Squamous cell carcinoma of skin of right upper limb, including shoulder: Secondary | ICD-10-CM | POA: Diagnosis not present

## 2016-03-14 DIAGNOSIS — R296 Repeated falls: Secondary | ICD-10-CM | POA: Diagnosis not present

## 2016-03-14 DIAGNOSIS — J449 Chronic obstructive pulmonary disease, unspecified: Secondary | ICD-10-CM | POA: Diagnosis not present

## 2016-03-14 DIAGNOSIS — F411 Generalized anxiety disorder: Secondary | ICD-10-CM | POA: Diagnosis not present

## 2016-03-19 ENCOUNTER — Other Ambulatory Visit: Payer: Self-pay | Admitting: *Deleted

## 2016-03-19 MED ORDER — CETIRIZINE HCL 10 MG PO TABS: 10.0000 mg | ORAL_TABLET | Freq: Every day | ORAL | Status: DC

## 2016-04-03 DIAGNOSIS — F322 Major depressive disorder, single episode, severe without psychotic features: Secondary | ICD-10-CM | POA: Diagnosis not present

## 2016-04-12 DIAGNOSIS — S20219A Contusion of unspecified front wall of thorax, initial encounter: Secondary | ICD-10-CM | POA: Diagnosis not present

## 2016-04-12 DIAGNOSIS — R0781 Pleurodynia: Secondary | ICD-10-CM | POA: Diagnosis not present

## 2016-04-15 ENCOUNTER — Other Ambulatory Visit: Payer: Self-pay | Admitting: Emergency Medicine

## 2016-04-17 DIAGNOSIS — R0789 Other chest pain: Secondary | ICD-10-CM | POA: Diagnosis not present

## 2016-04-18 ENCOUNTER — Other Ambulatory Visit: Payer: Self-pay | Admitting: *Deleted

## 2016-04-18 MED ORDER — ALBUTEROL SULFATE HFA 108 (90 BASE) MCG/ACT IN AERS: 2.0000 | INHALATION_SPRAY | Freq: Four times a day (QID) | RESPIRATORY_TRACT | 5 refills | Status: DC | PRN

## 2016-05-09 DIAGNOSIS — F5105 Insomnia due to other mental disorder: Secondary | ICD-10-CM | POA: Diagnosis not present

## 2016-05-09 DIAGNOSIS — G3184 Mild cognitive impairment, so stated: Secondary | ICD-10-CM | POA: Diagnosis not present

## 2016-06-07 DIAGNOSIS — Z23 Encounter for immunization: Secondary | ICD-10-CM | POA: Diagnosis not present

## 2016-06-09 ENCOUNTER — Telehealth: Payer: Self-pay | Admitting: Emergency Medicine

## 2016-06-09 NOTE — Telephone Encounter (Signed)
Pt has diagnosis of COPD Last ov w/ RB 4.17.17, with recs to follow up in 6 months:  Instructions  Please continue your Anoro every day Take albuterol 2 puffs up to every 4 hours if needed for shortness of breath.  Continue your singulair and fluticasone nasal spray as you have been taking them Follow with Dr Lamonte Sakai in 6 months or sooner if you have any problems   RB is not in the office until 10.12.17 and pt stated this needs to be mailed today or faxed or her power will be cut off and she wears nocturnal O2 (verified in chart).  Would a letter be okay?  Form given to As[place] to pass to [place].  BQ please advise, thank you.

## 2016-06-09 NOTE — Telephone Encounter (Signed)
OK by me to fax letter

## 2016-06-09 NOTE — Telephone Encounter (Signed)
Caryl Pina, do you still have this form? Please advise thanks

## 2016-06-10 NOTE — Telephone Encounter (Signed)
This form was received yesterday and placed in BQ's look-at folder.    BQ please advise when this is completed.  Thanks!

## 2016-06-10 NOTE — Telephone Encounter (Signed)
The form provided doesn't have anything for me to sign, looks like we need to send my last office note?

## 2016-06-12 NOTE — Telephone Encounter (Signed)
The forms provided by patient do not show where this information needs to be sent to- no address or fax # on file. I have lmtcb both yesterday and today for Seth Bake, patient's case worker, at [phone], the only number provided on this form.  Form is being held in my cubby to continue following up on.

## 2016-06-13 NOTE — Telephone Encounter (Signed)
lmtcb for Seth Bake, Science writer. lmtcb for patient to make aware that we have form but she needs to sign it, and we don't have any info as to where it needs to be sent.

## 2016-06-16 NOTE — Telephone Encounter (Signed)
Patient came by and signed papers and took them with her.  She said that she will drop them off at the [employer] office.  Nothing further needed.

## 2016-06-16 NOTE — Telephone Encounter (Signed)
Pt states that she is coming by today to sign this form and also she has all the information as to where this needs to be sent that she will give [place] when she gets here.  Will send to [facility] as FYI.

## 2016-06-26 ENCOUNTER — Encounter: Payer: Self-pay | Admitting: Emergency Medicine

## 2016-06-26 ENCOUNTER — Ambulatory Visit (INDEPENDENT_AMBULATORY_CARE_PROVIDER_SITE_OTHER): Payer: Medicare Other | Admitting: Emergency Medicine

## 2016-06-26 DIAGNOSIS — J301 Allergic rhinitis due to pollen: Secondary | ICD-10-CM

## 2016-06-26 DIAGNOSIS — K219 Gastro-esophageal reflux disease without esophagitis: Secondary | ICD-10-CM

## 2016-06-26 DIAGNOSIS — J449 Chronic obstructive pulmonary disease, unspecified: Secondary | ICD-10-CM

## 2016-06-26 MED ORDER — MONTELUKAST SODIUM 10 MG PO TABS: ORAL_TABLET | ORAL | 1 refills | Status: DC

## 2016-06-26 MED ORDER — ALBUTEROL SULFATE HFA 108 (90 BASE) MCG/ACT IN AERS: 2.0000 | INHALATION_SPRAY | Freq: Four times a day (QID) | RESPIRATORY_TRACT | 5 refills | Status: DC | PRN

## 2016-06-26 MED ORDER — UMECLIDINIUM-VILANTEROL 62.5-25 MCG/INH IN AEPB: INHALATION_SPRAY | RESPIRATORY_TRACT | 5 refills | Status: DC

## 2016-06-26 NOTE — Patient Instructions (Signed)
Please restart your prilosec and zyrtec.  Continue your singulair, flonase Continue your Anoro.  Take albuterol 2 puffs up to every 4 hours if needed for shortness of breath.  We will repeat your overnight oximetry on room air. We will reorder your night-time oxygen if you qualify.  Walking oximetry today. If you qualify we will try to get you a portable oxygen concentration to use during the day.  Follow with Dr Lamonte Sakai in 6 months or sooner if you have any problems

## 2016-06-26 NOTE — Assessment & Plan Note (Signed)
Continue your Anoro.  Take albuterol 2 puffs up to every 4 hours if needed for shortness of breath.  We will repeat your overnight oximetry on room air. We will reorder your night-time oxygen if you qualify.  Walking oximetry today. If you qualify we will try to get you a portable oxygen concentration to use during the day.  Follow with Dr Lamonte Sakai in 6 months or sooner if you have any problems

## 2016-06-26 NOTE — Addendum Note (Signed)
Addended by: Virl Cagey on: 06/26/2016 05:18 PM   Modules accepted: Orders

## 2016-06-26 NOTE — Assessment & Plan Note (Signed)
Please restart zyrtec.  Continue your singulair, flonase

## 2016-06-26 NOTE — Assessment & Plan Note (Signed)
Asked her to restart her PPI

## 2016-06-26 NOTE — Progress Notes (Signed)
Subjective:    Patient ID: Kathy Skinner, female    DOB: 1946/02/03, 70 y.o.   MRN: [id number]  HPI 70 yo former smoker (63 pk-yrs), hx COPD dx in ~2004, also carries dx ? asthma although suspect mostly COPD. Anxiety and panic attacks. Has been maintained on Advair + Spiriva for years.  She uses albuterol prn, for the last 3-4 weeks has used 4x a day.  She has been having more SOB at rest and w exertion. More secretions and cough.  Has baseline daily wheezing. She is on zyrtec + singulair. She has not tolerated nasal sprays in the past.   She is on chantix recently, has been on for several year off and on that she is using at times when she feels that she is going to restart smoking.   Kathy Skinner 5/'14 at PCP:  FVC 2.0L 68 % FEV1 1.2L 49% Ratio 58%  ROV 05/12/13 -- f/u for COPD and allergies.  Last time added zyrtec daily, continued spiriva and advair, but she ran out of the spiriva 4-5 weeks ago.  She states that her breathing has been worse since last time, using nebs more frequently. She has panic attacks that keep her from going outside to exercise. When she does so she is more SOB. She is having more cough, more mucous.    ROV 06/30/13 -- f/u for COPD and allergies.  Last time continued Advair, added Tudorza bid. She believes that it is helping her. Has not needed SABA since last time. She describes a MSK discomfort in her chest. She has also had some cough. Needs zyrtec and prilosec. Remains on singulair.   ROV 11/02/14 -- follow-up visit for COPD, allergic rhinitis. She hasn't been seen since 2014. Her current regimen is Advair + Caprice Renshaw, but she usually dos not take at night. She reports that since last time she has had an eval for wt loss, also for ulnar radiculopathy. Has been stable from a breathing standpoint. She remains on zyrtec singulair.   ROV 12/19/14 -- follow up for COPD and allergic rhinitis. We have tried to get Caprice Renshaw for her - she benefited from it, but medicare won't cover it.  She is worse over the last 3 weeks when she had to stop the [place]. They do have Incruse on their formulary. If we aren't able to get Tudorza then we will switch to this. She believes the Advair helps her.   She is back on incruse, notes that she has has diarrhea but that her stools are improving. She also notes that she has gotten blurry vision while on. She has continued to have the sx. She sees Dr Satira Sark in optho, has had corneal transplants, other procedures.   ROV 02/13/15 -- follow up visit for COPD and allergic rhinitis. After last visit we continue Advair and Incruse. She was to speak with her ophthalmologist about the potential impact of this medication on her eyes  - her eye pressures and evaluation were reassuring. She has benefited some, but does not feels as good as she did when she was on the [place]. She tells me today that she is concerned about some intermittent palmar erythema, raised veins.   She is waking with HA, taking naps. Some unintentional naps.   ROV 05/10/15 -- Acute OV for her COPD. Saw Dr Melvyn Novas in 7/'16, at that visit trial > she stopped incruse, went back on Tudorza bid. Also stopped Advair, has helped her throat. She states that the tudorza has helped her but  still limited. She has PND + cough, exertional SOB. Has been using Dayquil with good results.  Underwent PSG that I have reviewed 8/30 > no OSA but documented desats (no Central apneas). PFT were performed 9/2 and have personally reviewed. Show severe obstruction 1.31 L or 59% of predicted, no bronchodilator response, hyperinflated lung volumes.  She remains on zyrtec, flonase, singulair.   ROV 06/14/15 -- follow-up visit for severe COPD, nocturnal hypoxemia. At her last visit we tried stopping [place], replacing with Anoro. She has continued to have cough with associated back pain and L shoulder pain. She has been waking up SOB and w cough. Using SABA frequently. These sx are similar to last visit but have progressed.  She has seen her PCP and gone to walk-in clinic. Started azithro, minimal improvement.  She is on flonase, singulair.  Ice water seems to help her cough.    ROV 07/18/15 -- follow-up visit for severe COPD and chronic hypoxemic respiratory failure requiring nocturnal oxygen. At our last visit we continued anoro, treated her for a flare with prednisone. She improved on this. I suspect that her GERD is contributing to her frequent exacerbations and we added an empiric pantoprazole 40 mg daily - she finished it 2 days ago.  She has seen Dr Michail Sermon, is starting pro-biotics.  Has stable dyspnea with exertion for > 30 minutes. Changing temperature bothers her breathing as well.   ROV 12/17/15 -- patient with a history of very severe COPD and chronic hypoxemic respiratory failure. Last seen in November 2016.  She tells me that she has been fairly well except that her allergic rhinitis is more active right now - rhinorrhea, HA, some cough. She does cough when she wakes every am. Clear thick mucous. She is still on anoro, feels that it is benefiting. Her SABA use he decreased, about once a week. She does wheeze some. No ED visits, no flares.   ROV 06/26/16 -- this follow-up visit for very severe COPD with associated chronic hypoxemic resp failure. She reports that she has been limited by exertion after about 15-20 minutes. No exacerbations since last visit. She uses O2 at night, does not have anything to use for times when she does not sleep at home, has to be with family. Due to requalify for nocturnal oxygen with an ONO. She would like to have a POC for nights but I do not believe that DME will provide this for only nocturnal hypoxemia.  She remains on Anoro. She uses albuterol about every other day. She is having increased congestion with allergy season, increased cough. She remains on singulair. She is out of zyrtec and prilosec. She is on flonase nasal spray.      CAT Score 11/02/2014 02/03/2013  Total CAT Score  30 32    Review of Systems  Constitutional: Negative for fever and unexpected weight change.  HENT: Positive for congestion. Negative for dental problem, ear pain, nosebleeds, postnasal drip, rhinorrhea, sinus pressure, sneezing, sore throat and trouble swallowing.   Eyes: Negative for redness, itching and visual disturbance.  Respiratory: Positive for cough and shortness of breath. Negative for chest tightness and wheezing.   Cardiovascular: Negative for palpitations and leg swelling.  Gastrointestinal: Negative for nausea and vomiting.  Genitourinary: Negative for dysuria.  Musculoskeletal: Negative for joint swelling.  Skin: Negative for rash.  Neurological: Negative for dizziness and headaches.  Hematological: Does not bruise/bleed easily.  Psychiatric/Behavioral: Negative for dysphoric mood. The patient is not nervous/anxious.  Objective:   Physical Exam Vitals:   06/26/16 1641  BP: 132/72  BP Location: Left Arm  Cuff Size: Normal  Pulse: 86  SpO2: 93%  Weight: 168 lb (76.2 kg)  Height: 5\' 2"  (1.575 m)   Gen: Pleasant, overwt, in no distress,  normal affect  ENT: No lesions,  mouth clear,  oropharynx clear, no postnasal drip  Neck: No JVD, no TMG, no carotid bruits, mild exp stridor  Lungs: No use of accessory muscles, diffuse B coarse exp wheezes. Unchanged  Cardiovascular: RRR, heart sounds normal, no murmur or gallops, no peripheral edema  Musculoskeletal: No deformities, no cyanosis or clubbing  Neuro: alert, non focal  Skin: Warm, no lesions or rashes       Assessment & Plan:  GERD (gastroesophageal reflux disease) Asked her to restart her PPI  Allergic rhinitis Please restart zyrtec.  Continue your singulair, flonase   COPD GOLD III Continue your Anoro.  Take albuterol 2 puffs up to every 4 hours if needed for shortness of breath.  We will repeat your overnight oximetry on room air. We will reorder your night-time oxygen if you qualify.   Walking oximetry today. If you qualify we will try to get you a portable oxygen concentration to use during the day.  Follow with Dr Lamonte Sakai in 6 months or sooner if you have any problems  Baltazar Apo, MD, PhD 06/26/2016, 5:06 PM [facility] [phone] or if no answer [phone]

## 2016-07-03 DIAGNOSIS — F322 Major depressive disorder, single episode, severe without psychotic features: Secondary | ICD-10-CM | POA: Diagnosis not present

## 2016-07-04 ENCOUNTER — Telehealth: Payer: Self-pay | Admitting: Emergency Medicine

## 2016-07-04 MED ORDER — PREDNISONE 10 MG PO TABS: ORAL_TABLET | ORAL | 0 refills | Status: DC

## 2016-07-04 MED ORDER — AZITHROMYCIN 250 MG PO TABS: ORAL_TABLET | ORAL | 0 refills | Status: DC

## 2016-07-04 NOTE — Telephone Encounter (Signed)
Pt c/o increased prod coughing spells with dark yellow mucus X3-4 days, cough is waking her up at night and having rib discomfort from coughing.  Denies fever, sinus congestion.  Requesting something stronger to help suppress cough.   Pt has been taking dayquil and nyquil.   Pt uses [facility] on [address]   Sending to DOD as RB is post 11pm elink.   MW please advise on further recs.  Thanks!    Patient Instructions   Please restart your prilosec and zyrtec.  Continue your singulair, flonase Continue your Anoro.  Take albuterol 2 puffs up to every 4 hours if needed for shortness of breath.  We will repeat your overnight oximetry on room air. We will reorder your night-time oxygen if you qualify.  Walking oximetry today. If you qualify we will try to get you a portable oxygen concentration to use during the day.  Follow with Dr Lamonte Sakai in 6 months or sooner if you have any problems

## 2016-07-04 NOTE — Telephone Encounter (Signed)
Called and spoke to pt. Informed her of the recs per MW. Rx sent to preferred pharmacy. Pt verbalized understanding and denied any further questions or concerns at this time.   

## 2016-07-04 NOTE — Telephone Encounter (Signed)
zpak Prednisone 10 mg take  4 each am x 2 days,   2 each am x 2 days,  1 each am x 2 days and stop  

## 2016-08-20 ENCOUNTER — Telehealth: Payer: Self-pay | Admitting: Emergency Medicine

## 2016-08-20 NOTE — Telephone Encounter (Signed)
lmomtcb x1 

## 2016-08-21 ENCOUNTER — Other Ambulatory Visit: Payer: Self-pay

## 2016-08-21 DIAGNOSIS — M65342 Trigger finger, left ring finger: Secondary | ICD-10-CM | POA: Diagnosis not present

## 2016-08-21 MED ORDER — UMECLIDINIUM BROMIDE 62.5 MCG/INH IN AEPB: 1.0000 | INHALATION_SPRAY | Freq: Every day | RESPIRATORY_TRACT | 12 refills | Status: DC

## 2016-08-21 NOTE — Telephone Encounter (Signed)
Spoke with pt. And made her aware of CY recc. She agreed to the incruse, the inhaler was called into her pharmacy of choice. Nothing further is needed at this time.

## 2016-08-21 NOTE — Telephone Encounter (Signed)
Spoke with patient, feels that she is having a reaction to the Anoro.  Pt states that the Anoro was great and helps her breathing very much Pt states that she received an e-mail the COPD site she is registered with - states that if she was having any of the symptoms listed by the FDA then she needed to stop taking this immediately.  Pt is still taking this because she was concerned of just stopping this and not having anything in its place.  Pt c/o nervousness, confusion, interruption to sleep pattern, vision problems and several more but the patient said that she feels these were the most severe. Would like to change to another inhaler if possible.  Pt aware that Dr Lamonte Sakai is not available today and she requests that we send this message to another doctor.   Please advise Dr Annamaria Boots. Thanks.  Allergies as of 08/20/2016      Reactions   Codeine Nausea Only   Neurontin [gabapentin]    seizures   Tramadol Swelling      Medication List       Accurate as of 08/20/16 11:59 PM. Always use your most recent med list.          acetaminophen-codeine 300-30 MG tablet Commonly known as:  TYLENOL #3 Take 1-2 tablets by mouth every 4 (four) hours as needed for moderate pain.   albuterol (2.5 MG/3ML) 0.083% nebulizer solution Commonly known as:  PROVENTIL Take 3 mLs (2.5 mg total) by nebulization every 6 (six) hours as needed for wheezing.   albuterol 108 (90 Base) MCG/ACT inhaler Commonly known as:  PROVENTIL HFA;VENTOLIN HFA Inhale 2 puffs into the lungs every 6 (six) hours as needed for wheezing.   ALPRAZolam 1 MG tablet Commonly known as:  XANAX Take 0.5-1 mg by mouth daily as needed. For anxiety   azithromycin 250 MG tablet Commonly known as:  ZITHROMAX Take as directed   cetirizine 10 MG tablet Commonly known as:  ZYRTEC Take 1 tablet (10 mg total) by mouth daily.   fluticasone 50 MCG/ACT nasal spray Commonly known as:  FLONASE Place 2 sprays into both nostrils daily.    montelukast 10 MG tablet Commonly known as:  SINGULAIR TAKE 1 TABLET (10 MG TOTAL) BY MOUTH EVERY MORNING.   omeprazole 20 MG capsule Commonly known as:  PRILOSEC Take 1 capsule (20 mg total) by mouth daily.   oxybutynin 5 MG 24 hr tablet Commonly known as:  DITROPAN-XL Take 5 mg by mouth at bedtime.   predniSONE 10 MG tablet Commonly known as:  DELTASONE Take 4 tabs x 2 days, then 2 tabs x 2 days, then 1 tab x 2 days, then stop.   saccharomyces boulardii 250 MG capsule Commonly known as:  FLORASTOR Take 250 mg by mouth 2 (two) times daily.   sertraline 100 MG tablet Commonly known as:  ZOLOFT Take 100 mg by mouth daily.   umeclidinium-vilanterol 62.5-25 MCG/INH Aepb Commonly known as:  ANORO ELLIPTA INHALE 1 PUFF INTO THE LUNGS DAILY.   VAYACOG 100-19.5-6.5 MG Caps Generic drug:  Phosphatidylserine-DHA-EPA Take 1 capsule by mouth daily.      Allergies  Allergen Reactions  . Codeine Nausea Only  . Neurontin [Gabapentin]     seizures  . Tramadol Swelling

## 2016-08-21 NOTE — Telephone Encounter (Signed)
Offer change to Incruse Ellipta    Inhale 1 puff, once daily     Refill x 12

## 2016-08-29 DIAGNOSIS — M19012 Primary osteoarthritis, left shoulder: Secondary | ICD-10-CM | POA: Diagnosis not present

## 2016-08-29 DIAGNOSIS — M19011 Primary osteoarthritis, right shoulder: Secondary | ICD-10-CM | POA: Diagnosis not present

## 2016-09-04 ENCOUNTER — Telehealth: Payer: Self-pay | Admitting: Emergency Medicine

## 2016-09-04 DIAGNOSIS — J449 Chronic obstructive pulmonary disease, unspecified: Secondary | ICD-10-CM

## 2016-09-04 NOTE — Telephone Encounter (Signed)
Attempted to contact pt. No answer, no option to leave a message. Will try back.  

## 2016-09-05 NOTE — Telephone Encounter (Signed)
I think this was an ONO  Done 08/21/16 >> no significant desats. She does not need to use oxygen at night while sleeping.

## 2016-09-05 NOTE — Telephone Encounter (Signed)
Pt aware of results and voiced her understanding. Order has been placed to DC night time use oxygen. Nothing further needed.

## 2016-09-05 NOTE — Telephone Encounter (Signed)
Spoke with pt, who is requesting her sleep study results.  RB please advise. Thanks.

## 2016-10-02 DIAGNOSIS — R109 Unspecified abdominal pain: Secondary | ICD-10-CM | POA: Diagnosis not present

## 2016-10-02 DIAGNOSIS — R0602 Shortness of breath: Secondary | ICD-10-CM | POA: Diagnosis not present

## 2016-10-02 DIAGNOSIS — R05 Cough: Secondary | ICD-10-CM | POA: Diagnosis not present

## 2016-10-03 ENCOUNTER — Ambulatory Visit
Admission: RE | Admit: 2016-10-03 | Discharge: 2016-10-03 | Disposition: A | Discharge status: Home / Self Care | Payer: Medicare Other | Source: Ambulatory Visit | Attending: Family Medicine | Admitting: Family Medicine

## 2016-10-03 ENCOUNTER — Other Ambulatory Visit: Payer: Self-pay | Admitting: Family Medicine

## 2016-10-03 DIAGNOSIS — R05 Cough: Secondary | ICD-10-CM

## 2016-10-03 DIAGNOSIS — R059 Cough, unspecified: Secondary | ICD-10-CM

## 2016-10-03 DIAGNOSIS — R0602 Shortness of breath: Secondary | ICD-10-CM | POA: Diagnosis not present

## 2016-10-07 ENCOUNTER — Telehealth: Payer: Self-pay

## 2016-10-07 NOTE — Telephone Encounter (Signed)
Sent notes to scheduling 

## 2016-10-08 ENCOUNTER — Other Ambulatory Visit: Payer: Self-pay | Admitting: Family Medicine

## 2016-10-08 DIAGNOSIS — R109 Unspecified abdominal pain: Secondary | ICD-10-CM

## 2016-10-10 ENCOUNTER — Ambulatory Visit
Admission: RE | Admit: 2016-10-10 | Discharge: 2016-10-10 | Disposition: A | Discharge status: Home / Self Care | Payer: Medicare Other | Source: Ambulatory Visit | Attending: Family Medicine | Admitting: Family Medicine

## 2016-10-10 DIAGNOSIS — R109 Unspecified abdominal pain: Secondary | ICD-10-CM

## 2016-10-10 MED ORDER — IOPAMIDOL (ISOVUE-300) INJECTION 61%
100.0000 mL | Freq: Once | INTRAVENOUS | Status: AC | PRN
  Administered 2016-10-10: 100 mL via INTRAVENOUS

## 2016-10-15 ENCOUNTER — Encounter: Payer: Self-pay | Admitting: *Deleted

## 2016-10-16 ENCOUNTER — Telehealth: Payer: Self-pay | Admitting: Emergency Medicine

## 2016-10-16 NOTE — Telephone Encounter (Signed)
Spoke with pt. And she did not want to get scheduled with no other provider. Informed her RB was not going to be in the clinic tomorrow and Monday is was full and he had one open slot on tues. Informed the pt. That she can go to the ER is she feels like she needs to be seen sooner she stated she does not like the ER and I said she is more than welcome to go urgent care instead. She accepted the appointment. She said if things get really bad then she will go to the urgent care. She states she is just concerned about her breathing and feels like she just is not getting better after having pna and the flu/ She denied fever, c/o of increase sob and body aches and a slight cough. Nothing further is needed at this time.

## 2016-10-17 ENCOUNTER — Emergency Department (HOSPITAL_COMMUNITY): Payer: Medicare Other

## 2016-10-17 ENCOUNTER — Encounter (HOSPITAL_COMMUNITY): Payer: Self-pay | Admitting: Emergency Medicine

## 2016-10-17 ENCOUNTER — Emergency Department (HOSPITAL_COMMUNITY)
Admission: EM | Admit: 2016-10-17 | Discharge: 2016-10-18 | Disposition: A | Discharge status: Home / Self Care | Payer: Medicare Other | Attending: Emergency Medicine | Admitting: Emergency Medicine

## 2016-10-17 DIAGNOSIS — J441 Chronic obstructive pulmonary disease with (acute) exacerbation: Secondary | ICD-10-CM | POA: Insufficient documentation

## 2016-10-17 DIAGNOSIS — R0789 Other chest pain: Secondary | ICD-10-CM | POA: Diagnosis not present

## 2016-10-17 DIAGNOSIS — Z87891 Personal history of nicotine dependence: Secondary | ICD-10-CM | POA: Insufficient documentation

## 2016-10-17 DIAGNOSIS — Z0389 Encounter for observation for other suspected diseases and conditions ruled out: Secondary | ICD-10-CM | POA: Diagnosis not present

## 2016-10-17 DIAGNOSIS — R079 Chest pain, unspecified: Secondary | ICD-10-CM | POA: Diagnosis present

## 2016-10-17 DIAGNOSIS — R0602 Shortness of breath: Secondary | ICD-10-CM | POA: Diagnosis not present

## 2016-10-17 DIAGNOSIS — R05 Cough: Secondary | ICD-10-CM | POA: Diagnosis not present

## 2016-10-17 LAB — CBC
HEMATOCRIT: 46.9 % — AB (ref 36.0–46.0)
HEMOGLOBIN: 16.1 g/dL — AB (ref 12.0–15.0)
MCH: 32.5 pg (ref 26.0–34.0)
MCHC: 34.3 g/dL (ref 30.0–36.0)
MCV: 94.6 fL (ref 78.0–100.0)
Platelets: 191 10*3/uL (ref 150–400)
RBC: 4.96 MIL/uL (ref 3.87–5.11)
RDW: 13.6 % (ref 11.5–15.5)
WBC: 7.5 10*3/uL (ref 4.0–10.5)

## 2016-10-17 LAB — BASIC METABOLIC PANEL
ANION GAP: 8 (ref 5–15)
BUN: 9 mg/dL (ref 6–20)
CO2: 26 mmol/L (ref 22–32)
Calcium: 9.5 mg/dL (ref 8.9–10.3)
Chloride: 106 mmol/L (ref 101–111)
Creatinine, Ser: 0.87 mg/dL (ref 0.44–1.00)
Glucose, Bld: 91 mg/dL (ref 65–99)
POTASSIUM: 4.5 mmol/L (ref 3.5–5.1)
SODIUM: 140 mmol/L (ref 135–145)

## 2016-10-17 LAB — I-STAT TROPONIN, ED
Troponin i, poc: 0 ng/mL (ref 0.00–0.08)
Troponin i, poc: 0 ng/mL (ref 0.00–0.08)

## 2016-10-17 LAB — D-DIMER, QUANTITATIVE (NOT AT ARMC)

## 2016-10-17 MED ORDER — ALBUTEROL (5 MG/ML) CONTINUOUS INHALATION SOLN
10.0000 mg/h | INHALATION_SOLUTION | Freq: Once | RESPIRATORY_TRACT | Status: AC
  Administered 2016-10-17: 10 mg/h via RESPIRATORY_TRACT
  Filled 2016-10-17: qty 20

## 2016-10-17 MED ORDER — SODIUM CHLORIDE 0.9 % IV BOLUS (SEPSIS)
500.0000 mL | Freq: Once | INTRAVENOUS | Status: AC
  Administered 2016-10-17: 500 mL via INTRAVENOUS

## 2016-10-17 MED ORDER — PREDNISONE 10 MG PO TABS: 60.0000 mg | ORAL_TABLET | Freq: Every day | ORAL | 0 refills | Status: DC

## 2016-10-17 MED ORDER — DOXYCYCLINE HYCLATE 100 MG PO TABS
100.0000 mg | ORAL_TABLET | Freq: Once | ORAL | Status: AC
  Administered 2016-10-17: 100 mg via ORAL
  Filled 2016-10-17: qty 1

## 2016-10-17 MED ORDER — DOXYCYCLINE HYCLATE 100 MG PO CAPS: 100.0000 mg | ORAL_CAPSULE | Freq: Two times a day (BID) | ORAL | 0 refills | Status: DC

## 2016-10-17 MED ORDER — PREDNISONE 20 MG PO TABS
60.0000 mg | ORAL_TABLET | Freq: Once | ORAL | Status: AC
  Administered 2016-10-17: 60 mg via ORAL
  Filled 2016-10-17: qty 3

## 2016-10-17 NOTE — ED Triage Notes (Signed)
Pt reports she was diagnosed with PNA. Pt reports she has been having CP, SOB, and feels confused. Pt alert, oriented and speaking in complete sentences.

## 2016-10-17 NOTE — ED Triage Notes (Signed)
Correction Resp 20 per EMS

## 2016-10-17 NOTE — ED Triage Notes (Signed)
Per EMS-called to [facility] at [facility]. Pt c/o chest wall/rib pain x 4 weeks. Pt ask office to call EMS due to long wait at office. Pt reports 4 week hx of pneumonia . Completed antibiotic. Does not t fell better

## 2016-10-17 NOTE — ED Provider Notes (Signed)
[facility] DEPT Provider Note   CSN: [id number] Arrival date & time: 10/17/16  1439     History   Chief Complaint Chief Complaint  Patient presents with  . Chest Pain    HPI Kathy Skinner is a 71 y.o. female.  HPI Pt comes in with cc of DIB. Pt has hx of COPD. She reports that over the past few days her breathing has gotten worse. She has some chest pain associated with the DIB, but it is right sided. Pt's pain is not worse with inspiration. Pt has a new cough and new phlegm. She is not a smoker.    Past Medical History:  Diagnosis Date  . Anxiety   . Asthma   . COPD (chronic obstructive pulmonary disease) ([place])   . Panic attacks     Patient Active Problem List   Diagnosis Date Noted  . GERD (gastroesophageal reflux disease) 12/17/2015  . Hypersomnolence 02/13/2015  . COPD GOLD III 02/03/2013  . Allergic rhinitis 02/03/2013  . Generalized anxiety disorder 02/03/2013    Past Surgical History:  Procedure Laterality Date  . TUBAL LIGATION      OB History    No data available       Home Medications    Prior to Admission medications   Medication Sig Start Date End Date Taking? Authorizing Provider  albuterol (PROVENTIL HFA;VENTOLIN HFA) 108 (90 Base) MCG/ACT inhaler Inhale 2 puffs into the lungs every 6 (six) hours as needed for wheezing. 06/26/16  Yes Collene Gobble, MD  albuterol (PROVENTIL) (2.5 MG/3ML) 0.083% nebulizer solution Take 3 mLs (2.5 mg total) by nebulization every 6 (six) hours as needed for wheezing. 07/31/15  Yes Collene Gobble, MD  ALPRAZolam Duanne Moron) 1 MG tablet Take 0.5-1 mg by mouth daily as needed. For anxiety   Yes Historical Provider, MD  cetirizine (ZYRTEC) 10 MG tablet Take 1 tablet (10 mg total) by mouth daily. 03/19/16  Yes Collene Gobble, MD  fluticasone (FLONASE) 50 MCG/ACT nasal spray Place 2 sprays into both nostrils daily. 11/06/14  Yes Collene Gobble, MD  montelukast (SINGULAIR) 10 MG tablet TAKE 1 TABLET (10 MG TOTAL) BY MOUTH  EVERY MORNING. 06/26/16  Yes Collene Gobble, MD  sertraline (ZOLOFT) 100 MG tablet Take 100 mg by mouth daily.   Yes Historical Provider, MD  doxycycline (VIBRAMYCIN) 100 MG capsule Take 1 capsule (100 mg total) by mouth 2 (two) times daily. 10/17/16   Varney Biles, MD  omeprazole (PRILOSEC) 20 MG capsule Take 1 capsule (20 mg total) by mouth daily. Patient not taking: Reported on 10/17/2016 12/21/15   Collene Gobble, MD  predniSONE (DELTASONE) 10 MG tablet Take 6 tablets (60 mg total) by mouth daily. 10/17/16   Varney Biles, MD  umeclidinium bromide (INCRUSE ELLIPTA) 62.5 MCG/INH AEPB Inhale 1 puff into the lungs daily. Patient not taking: Reported on 10/17/2016 08/21/16   Deneise Lever, MD  umeclidinium-vilanterol [facility] ELLIPTA) 62.5-25 MCG/INH AEPB INHALE 1 PUFF INTO THE LUNGS DAILY. Patient not taking: Reported on 10/17/2016 06/26/16   Collene Gobble, MD    Family History Family History  Problem Relation Age of Onset  . Cancer Mother   . Aneurysm Father     Social History Social History  Substance Use Topics  . Smoking status: Former Smoker    Packs/day: 1.00    Years: 50.00    Types: Cigarettes    Quit date: 12/19/1999  . Smokeless tobacco: Never Used  . Alcohol use No  Allergies   Codeine; Neurontin [gabapentin]; and Tramadol   Review of Systems Review of Systems  ROS 10 Systems reviewed and are negative for acute change except as noted in the HPI.     Physical Exam Updated Vital Signs BP 134/85 (BP Location: Left Arm)   Pulse 73   Temp 98.3 F (36.8 C) (Oral)   Resp 20   SpO2 97%   Physical Exam  Constitutional: She is oriented to person, place, and time. She appears well-developed and well-nourished.  HENT:  Head: Normocephalic and atraumatic.  Eyes: EOM are normal. Pupils are equal, round, and reactive to light.  Neck: Neck supple.  Cardiovascular: Normal rate, regular rhythm, normal heart sounds and intact distal pulses.   No murmur  heard. Pulmonary/Chest: Effort normal. No respiratory distress. She has wheezes.  Generalized wheezing  Abdominal: Soft. She exhibits no distension. There is no tenderness. There is no rebound and no guarding.  Neurological: She is alert and oriented to person, place, and time.  Skin: Skin is warm and dry.  Nursing note and vitals reviewed.    ED Treatments / Results  Labs (all labs ordered are listed, but only abnormal results are displayed) Labs Reviewed  CBC - Abnormal; Notable for the following:       Result Value   Hemoglobin 16.1 (*)    HCT 46.9 (*)    All other components within normal limits  BASIC METABOLIC PANEL  D-DIMER, QUANTITATIVE (NOT AT [facility])  I-STAT TROPOININ, ED  I-STAT TROPOININ, ED    EKG  EKG Interpretation  Date/Time:  Friday October 17 2016 15:06:40 EST Ventricular Rate:  75 PR Interval:    QRS Duration: 90 QT Interval:  392 QTC Calculation: 435 R Axis:   30 Text Interpretation:  Sinus rhythm Low voltage, extremity leads No acute changes Confirmed by Kathrynn Humble, MD, Thelma Comp [phone]) on 10/17/2016 9:13:32 PM       Radiology Dg Chest 2 View  Result Date: 10/17/2016 CLINICAL DATA:  Shortness of Breath with cough EXAM: CHEST  2 VIEW COMPARISON:  April 12, 2016 and October 03, 2016 FINDINGS: There is stable opacity in the left base, felt to represent scarring. Lungs elsewhere clear. Heart is upper normal in size with pulmonary vascularity within normal limits. There is atherosclerotic calcification in the aorta. There is degenerative change in the thoracic spine. There is fairly marked anterior wedging of a lower thoracic vertebral body. IMPRESSION: No scarring left base. No edema or consolidation. Stable cardiac silhouette. There is aortic atherosclerosis. Electronically Signed   By: Lowella Grip III M.D.   On: 10/17/2016 15:50    Procedures Procedures (including critical care time)  Medications Ordered in ED Medications  predniSONE (DELTASONE)  tablet 60 mg (not administered)  doxycycline (VIBRA-TABS) tablet 100 mg (not administered)  sodium chloride 0.9 % bolus 500 mL (500 mLs Intravenous New Bag/Given 10/17/16 2219)  albuterol (PROVENTIL,VENTOLIN) solution continuous neb (10 mg/hr Nebulization Given 10/17/16 2212)     Initial Impression / Assessment and Plan / ED Course  I have reviewed the triage vital signs and the nursing notes.  Pertinent labs & imaging results that were available during my care of the patient were reviewed by me and considered in my medical decision making (see chart for details).  Clinical Course as of Oct 17 2321  Fri Oct 17, 2016  2323 Repeat exam reveals clearing of wheezing in all lung fields. Patient is not in any respiratory distress nor is there hypoxia.   [AN]  Clinical Course User Index [AN] Varney Biles, MD    Pt comes in with cc of DIB. She is wheezing. She has R sided pain. We will get trops x 2 and dimer. Dimer ordered, as pt reports that her symptoms, to her, doesn't appear to be COPD related. She also doesn't get chest pain with her COPD, and her DIB is more pronounced than usual. If her Dimer is neg, with new phlegm and cough, we will start pt on doxy.  Final Clinical Impressions(s) / ED Diagnoses   Final diagnoses:  COPD with acute exacerbation (HCC)    New Prescriptions New Prescriptions   DOXYCYCLINE (VIBRAMYCIN) 100 MG CAPSULE    Take 1 capsule (100 mg total) by mouth 2 (two) times daily.   PREDNISONE (DELTASONE) 10 MG TABLET    Take 6 tablets (60 mg total) by mouth daily.     Varney Biles, MD 10/17/16 [phone]

## 2016-10-17 NOTE — ED Notes (Signed)
Pt ambulated well in the hall--- O2 sat 93% on room air while ambulating.

## 2016-10-17 NOTE — Discharge Instructions (Signed)
We saw you in the ER for your breathing related complains. We gave you some breathing treatments in the ER, and seems like your symptoms have improved. °Please take albuterol as needed every 4 hours. °Please take the medications prescribed. °Please refrain from smoking or smoke exposure. °Please see a primary care doctor in 1 week. °Return to the ER if your symptoms worsen. ° °

## 2016-10-18 NOTE — ED Notes (Signed)
Pharmacy called regarding questions about scripts. Chart opened and referred questions to  Dr Vanita Panda.

## 2016-10-21 ENCOUNTER — Ambulatory Visit (INDEPENDENT_AMBULATORY_CARE_PROVIDER_SITE_OTHER): Payer: Medicare Other | Admitting: Emergency Medicine

## 2016-10-21 ENCOUNTER — Ambulatory Visit: Payer: Medicare Other | Admitting: Emergency Medicine

## 2016-10-21 ENCOUNTER — Encounter: Payer: Self-pay | Admitting: Emergency Medicine

## 2016-10-21 DIAGNOSIS — J449 Chronic obstructive pulmonary disease, unspecified: Secondary | ICD-10-CM

## 2016-10-21 MED ORDER — PREDNISONE 10 MG PO TABS: ORAL_TABLET | ORAL | 0 refills | Status: DC

## 2016-10-21 NOTE — Patient Instructions (Addendum)
We will continue your doxycycline until completely gone Continue Incruse once a day Keep albuterol available to use 2 puffs as needed for shortness of breath.  Please start taking prednisone taper with plan to stay on 5 mg daily once the taper is completed.  We may need to reconsider starting oxygen to use with exertion.      Follow with APP in 2 weeks Follow with Dr Lamonte Sakai in 3 months or sooner if you have any problems.

## 2016-10-21 NOTE — Progress Notes (Signed)
Subjective:    Patient ID: Kathy Skinner, female    DOB: 1946/07/22, 71 y.o.   MRN: [id number]  HPI 71 yo former smoker (75 pk-yrs), hx COPD dx in ~2004, also carries dx ? asthma although suspect mostly COPD. Anxiety and panic attacks. Has been maintained on Advair + Spiriva for years.  She uses albuterol prn, for the last 3-4 weeks has used 4x a day.  She has been having more SOB at rest and w exertion. More secretions and cough.  Has baseline daily wheezing. She is on zyrtec + singulair. She has not tolerated nasal sprays in the past.   She is on chantix recently, has been on for several year off and on that she is using at times when she feels that she is going to restart smoking.   Kathy Skinner 5/'14 at PCP:  FVC 2.0L 68 % FEV1 1.2L 49% Ratio 58%  ROV 05/12/13 -- f/u for COPD and allergies.  Last time added zyrtec daily, continued spiriva and advair, but she ran out of the spiriva 4-5 weeks ago.  She states that her breathing has been worse since last time, using nebs more frequently. She has panic attacks that keep her from going outside to exercise. When she does so she is more SOB. She is having more cough, more mucous.    ROV 06/30/13 -- f/u for COPD and allergies.  Last time continued Advair, added Tudorza bid. She believes that it is helping her. Has not needed SABA since last time. She describes a MSK discomfort in her chest. She has also had some cough. Needs zyrtec and prilosec. Remains on singulair.   ROV 11/02/14 -- follow-up visit for COPD, allergic rhinitis. She hasn't been seen since 2014. Her current regimen is Advair + Caprice Renshaw, but she usually dos not take at night. She reports that since last time she has had an eval for wt loss, also for ulnar radiculopathy. Has been stable from a breathing standpoint. She remains on zyrtec singulair.   ROV 12/19/14 -- follow up for COPD and allergic rhinitis. We have tried to get Caprice Renshaw for her - she benefited from it, but medicare won't cover it.  She is worse over the last 3 weeks when she had to stop the [place]. They do have Incruse on their formulary. If we aren't able to get Tudorza then we will switch to this. She believes the Advair helps her.   She is back on incruse, notes that she has has diarrhea but that her stools are improving. She also notes that she has gotten blurry vision while on. She has continued to have the sx. She sees Dr Satira Sark in optho, has had corneal transplants, other procedures.   ROV 02/13/15 -- follow up visit for COPD and allergic rhinitis. After last visit we continue Advair and Incruse. She was to speak with her ophthalmologist about the potential impact of this medication on her eyes  - her eye pressures and evaluation were reassuring. She has benefited some, but does not feels as good as she did when she was on the [place]. She tells me today that she is concerned about some intermittent palmar erythema, raised veins.   She is waking with HA, taking naps. Some unintentional naps.   ROV 05/10/15 -- Acute OV for her COPD. Saw Dr Melvyn Novas in 7/'16, at that visit trial > she stopped incruse, went back on Tudorza bid. Also stopped Advair, has helped her throat. She states that the tudorza has helped her but  still limited. She has PND + cough, exertional SOB. Has been using Dayquil with good results.  Underwent PSG that I have reviewed 8/30 > no OSA but documented desats (no Central apneas). PFT were performed 9/2 and have personally reviewed. Show severe obstruction 1.31 L or 59% of predicted, no bronchodilator response, hyperinflated lung volumes.  She remains on zyrtec, flonase, singulair.   ROV 06/14/15 -- follow-up visit for severe COPD, nocturnal hypoxemia. At her last visit we tried stopping [place], replacing with Anoro. She has continued to have cough with associated back pain and L shoulder pain. She has been waking up SOB and w cough. Using SABA frequently. These sx are similar to last visit but have progressed.  She has seen her PCP and gone to walk-in clinic. Started azithro, minimal improvement.  She is on flonase, singulair.  Ice water seems to help her cough.    ROV 07/18/15 -- follow-up visit for severe COPD and chronic hypoxemic respiratory failure requiring nocturnal oxygen. At our last visit we continued anoro, treated her for a flare with prednisone. She improved on this. I suspect that her GERD is contributing to her frequent exacerbations and we added an empiric pantoprazole 40 mg daily - she finished it 2 days ago.  She has seen Dr Michail Sermon, is starting pro-biotics.  Has stable dyspnea with exertion for > 30 minutes. Changing temperature bothers her breathing as well.   ROV 12/17/15 -- patient with a history of very severe COPD and chronic hypoxemic respiratory failure. Last seen in November 2016.  She tells me that she has been fairly well except that her allergic rhinitis is more active right now - rhinorrhea, HA, some cough. She does cough when she wakes every am. Clear thick mucous. She is still on anoro, feels that it is benefiting. Her SABA use he decreased, about once a week. She does wheeze some. No ED visits, no flares.   ROV 06/26/16 -- this follow-up visit for very severe COPD with associated chronic hypoxemic resp failure. She reports that she has been limited by exertion after about 15-20 minutes. No exacerbations since last visit. She uses O2 at night, does not have anything to use for times when she does not sleep at home, has to be with family. Due to requalify for nocturnal oxygen with an ONO. She would like to have a POC for nights but I do not believe that DME will provide this for only nocturnal hypoxemia.  She remains on Anoro. She uses albuterol about every other day. She is having increased congestion with allergy season, increased cough. She remains on singulair. She is out of zyrtec and prilosec. She is on flonase nasal spray.    Acute OV  10/21/16 -- Kathy Skinner has a history of  very severe COPD, chronic hypoxemic respiratory failure. She presents today for an acute visit. She has been having symptoms since late November, was dx with URI (? Flu) in November. Believes that she was treated with abx and prednisone although she is having a difficult time relating the history. A CT scan of the chest was apparently done at [facility] imaging. She was just seen in ED 2/16 >> CXR clear. Treated with prednisone and doxycycline again, completing now. She believes that these have helped her some. Less CP, less cough. Current inhaled medication is Incruse, is tolerating but she does not believe it helps her as much as incruse. She is using albuterol about 4-5x a day.   No flowsheet data found.  Review of Systems  Constitutional: Negative for fever and unexpected weight change.  HENT: Positive for congestion. Negative for dental problem, ear pain, nosebleeds, postnasal drip, rhinorrhea, sinus pressure, sneezing, sore throat and trouble swallowing.   Eyes: Negative for redness, itching and visual disturbance.  Respiratory: Positive for cough and shortness of breath. Negative for chest tightness and wheezing.   Cardiovascular: Negative for palpitations and leg swelling.  Gastrointestinal: Negative for nausea and vomiting.  Genitourinary: Negative for dysuria.  Musculoskeletal: Negative for joint swelling.  Skin: Negative for rash.  Neurological: Negative for dizziness and headaches.  Hematological: Does not bruise/bleed easily.  Psychiatric/Behavioral: Negative for dysphoric mood. The patient is not nervous/anxious.        Objective:   Physical Exam Vitals:   10/21/16 1524  BP: 138/84  BP Location: Left Arm  Cuff Size: Normal  Pulse: 85  Temp: 98.2 F (36.8 C)  TempSrc: Oral  SpO2: 95%  Weight: 166 lb (75.3 kg)  Height: 5\' 2"  (1.575 m)   Gen: Pleasant, overwt, in no distress,  normal affect  ENT: No lesions,  mouth clear,  oropharynx clear, no postnasal drip  Neck:  No JVD, no TMG, no carotid bruits, mild exp stridor  Lungs: No use of accessory muscles, loud diffuse B coarse exp wheezes.  Cardiovascular: RRR, heart sounds normal, no murmur or gallops, no peripheral edema  Musculoskeletal: No deformities, no cyanosis or clubbing  Neuro: alert, non focal  Skin: Warm, no lesions or rashes       Assessment & Plan:  COPD GOLD D Acute and now apparently subacute worsening. I suspect that she will need daily pred. She sent back her O2 but now may end up needing it w exertion. I would like to treat her w a longer taper, start 5mg  daily after the taper is done. Then walk her to see if she desaturates on her follow up.   We will continue your doxycycline until completely gone Continue Incruse once a day Keep albuterol available to use 2 puffs as needed for shortness of breath.  Please start taking prednisone taper with plan to stay on 5 mg daily once the taper is completed.  We may need to reconsider starting oxygen to use with exertion.      Follow with APP in 2 weeks Follow with Dr Lamonte Sakai in 3 months or sooner if you have any problems.  Baltazar Apo, MD, PhD 10/21/2016, 3:54 PM [facility] [phone] or if no answer [phone]

## 2016-10-21 NOTE — Assessment & Plan Note (Signed)
Acute and now apparently subacute worsening. I suspect that she will need daily pred. She sent back her O2 but now may end up needing it w exertion. I would like to treat her w a longer taper, start 5mg  daily after the taper is done. Then walk her to see if she desaturates on her follow up.   We will continue your doxycycline until completely gone Continue Incruse once a day Keep albuterol available to use 2 puffs as needed for shortness of breath.  Please start taking prednisone taper with plan to stay on 5 mg daily once the taper is completed.  We may need to reconsider starting oxygen to use with exertion.      Follow with APP in 2 weeks Follow with Dr Lamonte Sakai in 3 months or sooner if you have any problems.

## 2016-10-22 ENCOUNTER — Ambulatory Visit (INDEPENDENT_AMBULATORY_CARE_PROVIDER_SITE_OTHER): Payer: Medicare Other | Admitting: Cardiology

## 2016-10-22 ENCOUNTER — Encounter: Payer: Self-pay | Admitting: Cardiology

## 2016-10-22 ENCOUNTER — Telehealth (HOSPITAL_COMMUNITY): Payer: Self-pay | Admitting: *Deleted

## 2016-10-22 VITALS — BP 138/86 | HR 77 | Ht 62.0 in | Wt 167.1 lb

## 2016-10-22 DIAGNOSIS — R079 Chest pain, unspecified: Secondary | ICD-10-CM

## 2016-10-22 NOTE — Progress Notes (Signed)
Cardiology Office Note    Date:  10/22/2016   ID:  Avanelle, Ritch 25-Nov-1945, MRN [id number]  PCP:  Juanell Fairly, MD (Inactive)  Cardiologist:  Fransico Him, MD   Chief Complaint  Patient presents with  . Chest Pain    History of Present Illness:  Kathy Skinner is a 71 y.o. female with a history of anxiety, asthma, severe COPD with hypoxemic respiratory failure and panic attacks who was referred for evaluation of chest pain.  She noticed recently that her breathing had become worse and was having chest pain  located over the right chest.  This first occurred after she got the flu shot in November.  She has been  Coughing with increased sputum production.  She was seen by her PCP 2 weeks ago and was apparently diagnosed with PNA and was placed on steroids and antibiotics.  Her symptoms continued despite nebs and inhalers and she started having pains all over her body along with dizziness went to the ER 10/17/2016.  She says that she was having pain up under her right breast.  The pain was a very sharp discomfort on her rib cage.  The pain was worse when she took a deep breath in.  Her cardiac workup was normal.  EKG was nonischemic and trop was normal.  D-Dimer was negative.  She is now referred for further evaluation.  She was seen by her Pulmonologist yesterday and her CP had improved as well as cough.  He is now treating her with a long taper of steroids.  She is here today now for evaluation.  She says that the CP has subsided but not gone completely away.  She says that the CP has been there constantly for several weeks and also had some on the left side.  She says that she had some pain in between her shoulder blades last night with movement of her back.  She has never had a cardiac workup done.    Past Medical History:  Diagnosis Date  . Anxiety   . Asthma   . COPD (chronic obstructive pulmonary disease) ([place])   . Panic attacks     Past Surgical History:  Procedure Laterality  Date  . TUBAL LIGATION      Current Medications: Current Meds  Medication Sig  . albuterol (PROVENTIL HFA;VENTOLIN HFA) 108 (90 Base) MCG/ACT inhaler Inhale 2 puffs into the lungs every 6 (six) hours as needed for wheezing.  Marland Kitchen albuterol (PROVENTIL) (2.5 MG/3ML) 0.083% nebulizer solution Take 3 mLs (2.5 mg total) by nebulization every 6 (six) hours as needed for wheezing.  Marland Kitchen ALPRAZolam (XANAX) 1 MG tablet Take 0.5-1 mg by mouth daily as needed. For anxiety  . cetirizine (ZYRTEC) 10 MG tablet Take 1 tablet (10 mg total) by mouth daily.  Marland Kitchen doxycycline (VIBRAMYCIN) 100 MG capsule Take 1 capsule (100 mg total) by mouth 2 (two) times daily.  . fluticasone (FLONASE) 50 MCG/ACT nasal spray Place 2 sprays into both nostrils daily.  . montelukast (SINGULAIR) 10 MG tablet TAKE 1 TABLET (10 MG TOTAL) BY MOUTH EVERY MORNING.  Marland Kitchen omeprazole (PRILOSEC) 20 MG capsule Take 1 capsule (20 mg total) by mouth daily.  . predniSONE (DELTASONE) 10 MG tablet 40mg X3 days, 30mg  X3 days, 20mg  X3 days, 10mg X3 days, then stay at 5mg  daily.  . sertraline (ZOLOFT) 100 MG tablet Take 100 mg by mouth daily.  Marland Kitchen umeclidinium bromide (INCRUSE ELLIPTA) 62.5 MCG/INH AEPB Inhale 1 puff into the lungs daily.  Allergies:   Codeine; Neurontin [gabapentin]; and Tramadol   Social History   Social History  . Marital status: Single    Spouse name: N/A  . Number of children: N/A  . Years of education: N/A   Social History Main Topics  . Smoking status: Former Smoker    Packs/day: 1.00    Years: 50.00    Types: Cigarettes    Quit date: 12/19/1999  . Smokeless tobacco: Never Used  . Alcohol use No  . Drug use: No  . Sexual activity: Not Currently   Other Topics Concern  . None   Social History Narrative  . None     Family History:  The patient's family history includes Aneurysm in her father; Cancer in her mother.   ROS:   Please see the history of present illness.    ROS All other systems reviewed and are  negative.  No flowsheet data found.     PHYSICAL EXAM:   VS:  BP 138/86   Pulse 77   Ht 5\' 2"  (1.575 m)   Wt 167 lb 1.9 oz (75.8 kg)   SpO2 96%   BMI 30.57 kg/m    GEN: Well nourished, well developed, in no acute distress  HEENT: normal  Neck: no JVD, carotid bruits, or masses Cardiac: RRR; no murmurs, rubs, or gallops,no edema.  Intact distal pulses bilaterally.  Respiratory:  Scattered expiratory wheezes GI: soft, nontender, nondistended, + BS MS: no deformity or atrophy  Skin: warm and dry, no rash Neuro:  Alert and Oriented x 3, Strength and sensation are intact Psych: euthymic mood, full affect  Wt Readings from Last 3 Encounters:  10/22/16 167 lb 1.9 oz (75.8 kg)  10/21/16 166 lb (75.3 kg)  06/26/16 168 lb (76.2 kg)      Studies/Labs Reviewed:   EKG:  EKG is not ordered today.    Recent Labs: 10/17/2016: BUN 9; Creatinine, Ser 0.87; Hemoglobin 16.1; Platelets 191; Potassium 4.5; Sodium 140   Lipid Panel No results found for: CHOL, TRIG, HDL, CHOLHDL, VLDL, LDLCALC, LDLDIRECT  Additional studies/ records that were reviewed today include:  Office notes from pulmonary and ER visit 10/17/2016    ASSESSMENT:    1. Chest pain, unspecified type      PLAN:  In order of problems listed above:  1. Chest pain - her pain is atypical and initially was right sided and constant over her ribs and then moved to her left side briefly.  I suspect this is due to her underlying recent PNA and COPD exacerbation.  She does have CRFs including ongoing tobacco use, post menopausal state and family history of vascular disease. Her EKG is nonischemic. I will get a 2D echo to assess LVF and Lexiscan myoview to rule out ischemia.     Medication Adjustments/Labs and Tests Ordered: Current medicines are reviewed at length with the patient today.  Concerns regarding medicines are outlined above.  Medication changes, Labs and Tests ordered today are listed in the Patient Instructions  below.  Patient Instructions  Medication Instructions:  Your physician recommends that you continue on your current medications as directed. Please refer to the Current Medication list given to you today.   Labwork: None  Testing/Procedures: Your physician has requested that you have an echocardiogram. Echocardiography is a painless test that uses sound waves to create images of your heart. It provides your doctor with information about the size and shape of your heart and how well your heart's chambers and valves  are working. This procedure takes approximately one hour. There are no restrictions for this procedure.   Your physician has requested that you have a lexiscan myoview. For further information please visit [email or web address]. Please follow instruction sheet, as given.  Follow-Up: Your physician recommends that you schedule a follow-up appointment AS NEEDED with Dr. Radford Pax pending study results.   Any Other Special Instructions Will Be Listed Below (If Applicable).     If you need a refill on your cardiac medications before your next appointment, please call your pharmacy.      Signed, Fransico Him, MD  10/22/2016 3:00 PM    [address], [place], [place]  [address] Phone: [phone]; Fax: [phone]

## 2016-10-22 NOTE — Patient Instructions (Addendum)
Medication Instructions:  Your physician recommends that you continue on your current medications as directed. Please refer to the Current Medication list given to you today.   Labwork: None  Testing/Procedures: Your physician has requested that you have an echocardiogram. Echocardiography is a painless test that uses sound waves to create images of your heart. It provides your doctor with information about the size and shape of your heart and how well your heart's chambers and valves are working. This procedure takes approximately one hour. There are no restrictions for this procedure.   Your physician has requested that you have a lexiscan myoview. For further information please visit [email or web address]. Please follow instruction sheet, as given.  Follow-Up: Your physician recommends that you schedule a follow-up appointment AS NEEDED with Dr. Turner pending study results.  Any Other Special Instructions Will Be Listed Below (If Applicable).     If you need a refill on your cardiac medications before your next appointment, please call your pharmacy.   

## 2016-10-22 NOTE — Telephone Encounter (Signed)
Patient given detailed instructions per Myocardial Perfusion Study Information Sheet for the test on 10/24/16 at 9:15. Patient notified to arrive 15 minutes early and that it is imperative to arrive on time for appointment to keep from having the test rescheduled.  If you need to cancel or reschedule your appointment, please call the office within 24 hours of your appointment. Failure to do so may result in a cancellation of your appointment, and a $50 no show fee. Patient verbalized understanding.Veronia Beets

## 2016-10-23 DIAGNOSIS — F322 Major depressive disorder, single episode, severe without psychotic features: Secondary | ICD-10-CM | POA: Diagnosis not present

## 2016-10-24 ENCOUNTER — Ambulatory Visit (HOSPITAL_COMMUNITY): Payer: Medicare Other | Attending: Cardiology

## 2016-10-24 DIAGNOSIS — I10 Essential (primary) hypertension: Secondary | ICD-10-CM | POA: Diagnosis not present

## 2016-10-24 DIAGNOSIS — R079 Chest pain, unspecified: Secondary | ICD-10-CM | POA: Insufficient documentation

## 2016-10-24 LAB — MYOCARDIAL PERFUSION IMAGING
CHL CUP NUCLEAR SDS: 2
CHL CUP NUCLEAR SRS: 3
CHL CUP NUCLEAR SSS: 5
CSEPPHR: 90 {beats}/min
LV dias vol: 50 mL (ref 46–106)
LVSYSVOL: 14 mL
RATE: 0.24
Rest HR: 73 {beats}/min
TID: 0.89

## 2016-10-24 MED ORDER — TECHNETIUM TC 99M TETROFOSMIN IV KIT
31.7000 | PACK | Freq: Once | INTRAVENOUS | Status: AC | PRN
  Administered 2016-10-24: 31.7 via INTRAVENOUS
  Filled 2016-10-24: qty 32

## 2016-10-24 MED ORDER — TECHNETIUM TC 99M TETROFOSMIN IV KIT
10.2000 | PACK | Freq: Once | INTRAVENOUS | Status: AC | PRN
  Administered 2016-10-24: 10.2 via INTRAVENOUS
  Filled 2016-10-24: qty 11

## 2016-10-24 MED ORDER — REGADENOSON 0.4 MG/5ML IV SOLN
0.4000 mg | Freq: Once | INTRAVENOUS | Status: AC
  Administered 2016-10-24: 0.4 mg via INTRAVENOUS

## 2016-11-04 ENCOUNTER — Ambulatory Visit (INDEPENDENT_AMBULATORY_CARE_PROVIDER_SITE_OTHER): Payer: Medicare Other | Admitting: Adult Health

## 2016-11-04 ENCOUNTER — Encounter: Payer: Self-pay | Admitting: Adult Health

## 2016-11-04 VITALS — BP 124/72 | HR 77 | Ht 62.0 in | Wt 170.4 lb

## 2016-11-04 DIAGNOSIS — J449 Chronic obstructive pulmonary disease, unspecified: Secondary | ICD-10-CM

## 2016-11-04 DIAGNOSIS — J9611 Chronic respiratory failure with hypoxia: Secondary | ICD-10-CM

## 2016-11-04 DIAGNOSIS — J9610 Chronic respiratory failure, unspecified whether with hypoxia or hypercapnia: Secondary | ICD-10-CM | POA: Insufficient documentation

## 2016-11-04 MED ORDER — PREDNISONE 5 MG PO TABS: 5.0000 mg | ORAL_TABLET | Freq: Every day | ORAL | 5 refills | Status: DC

## 2016-11-04 NOTE — Patient Instructions (Addendum)
Set up ONO .  Continue INCRUSE daily .  Continue on Prednisone 5mg  daily .  follow up Dr. Lamonte Sakai  In 6-8 weeks and As needed   Please contact office for sooner follow up if symptoms do not improve or worsen or seek emergency care

## 2016-11-04 NOTE — Assessment & Plan Note (Addendum)
Recent flare now resolving - steroid pat education   Plan  Patient Instructions  Set up ONO .  Continue INCRUSE daily .  Continue on Prednisone 5mg  daily .  follow up Dr. Lamonte Sakai  In 6-8 weeks and As needed   Please contact office for sooner follow up if symptoms do not improve or worsen or seek emergency care

## 2016-11-04 NOTE — Addendum Note (Signed)
Addended by: Parke Poisson E on: 11/04/2016 05:25 PM   Modules accepted: Orders

## 2016-11-04 NOTE — Assessment & Plan Note (Signed)
Repeat ONO to see if nocturnal hypoxia persists.

## 2016-11-04 NOTE — Progress Notes (Signed)
@Patient  ID: Kathy Skinner, female    DOB: Apr 02, 1946, 71 y.o.   MRN: [id number]  Chief Complaint  Patient presents with  . Follow-up    COPD     Referring provider: No ref. provider found  HPI: 71 yo female former smoker followed for COPD GOLD D   TEST  [name] 5/'14 at PCP:  FVC 2.0L 68 % FEV1 1.2L 49% Ratio 58%  11/04/2016 Follow up: COPD  Pt returns for 2 week follow up . She was seen last ov with COPD flare , tx w/ abx and steroids taper . She was instructed to remain on low dose predniosne at 5mg  daily.  She is feeling better w/ less cough and congestion .  She remains on INCRUSE.  She was previously on O2 At bedtime  But did ONO that showed no desats. O2 was Discontinued.  She wants to have a repeat overnight oximetry test. She says that previously she did this incorrectly only for a couple hours in the recliner. She wants to repeat this and have it done when she is lying in the bed, or supine position. I do up for several hours while she is sleeping   Allergies  Allergen Reactions  . Codeine Nausea Only  . Neurontin [Gabapentin]     seizures  . Tramadol Swelling    Immunization History  Administered Date(s) Administered  . Influenza Split 05/31/2013, 07/04/2014  . Influenza Whole 07/02/2012  . Influenza, High Dose Seasonal PF 07/21/2016  . Influenza-Unspecified 05/09/2015  . Pneumococcal Conjugate-13 08/03/2014  . Pneumococcal Polysaccharide-23 07/02/2012    Past Medical History:  Diagnosis Date  . Anxiety   . Asthma   . COPD (chronic obstructive pulmonary disease) ([place])   . Panic attacks     Tobacco History: History  Smoking Status  . Former Smoker  . Packs/day: 1.00  . Years: 50.00  . Types: Cigarettes  . Quit date: 12/19/1999  Smokeless Tobacco  . Never Used   Counseling given: Not Answered   Outpatient Encounter Prescriptions as of 11/04/2016  Medication Sig  . albuterol (PROVENTIL HFA;VENTOLIN HFA) 108 (90 Base) MCG/ACT inhaler Inhale 2  puffs into the lungs every 6 (six) hours as needed for wheezing.  Marland Kitchen albuterol (PROVENTIL) (2.5 MG/3ML) 0.083% nebulizer solution Take 3 mLs (2.5 mg total) by nebulization every 6 (six) hours as needed for wheezing.  Marland Kitchen ALPRAZolam (XANAX) 1 MG tablet Take 0.5-1 mg by mouth daily as needed. For anxiety  . cetirizine (ZYRTEC) 10 MG tablet Take 1 tablet (10 mg total) by mouth daily.  . fluticasone (FLONASE) 50 MCG/ACT nasal spray Place 2 sprays into both nostrils daily.  . montelukast (SINGULAIR) 10 MG tablet TAKE 1 TABLET (10 MG TOTAL) BY MOUTH EVERY MORNING.  Marland Kitchen omeprazole (PRILOSEC) 20 MG capsule Take 1 capsule (20 mg total) by mouth daily.  . predniSONE (DELTASONE) 10 MG tablet 40mg X3 days, 30mg  X3 days, 20mg  X3 days, 10mg X3 days, then stay at 5mg  daily.  . sertraline (ZOLOFT) 100 MG tablet Take 100 mg by mouth daily.  Marland Kitchen umeclidinium bromide (INCRUSE ELLIPTA) 62.5 MCG/INH AEPB Inhale 1 puff into the lungs daily.  . [DISCONTINUED] doxycycline (VIBRAMYCIN) 100 MG capsule Take 1 capsule (100 mg total) by mouth 2 (two) times daily. (Patient not taking: Reported on 11/04/2016)   No facility-administered encounter medications on file as of 11/04/2016.      Review of Systems  Constitutional:   No  weight loss, night sweats,  Fevers, chills,  +fatigue, or  lassitude.  HEENT:   No headaches,  Difficulty swallowing,  Tooth/dental problems, or  Sore throat,                No sneezing, itching, ear ache, nasal congestion, post nasal drip,   CV:  No chest pain,  Orthopnea, PND, swelling in lower extremities, anasarca, dizziness, palpitations, syncope.   GI  No heartburn, indigestion, abdominal pain, nausea, vomiting, diarrhea, change in bowel habits, loss of appetite, bloody stools.   Resp:    No chest wall deformity  Skin: no rash or lesions.  GU: no dysuria, change in color of urine, no urgency or frequency.  No flank pain, no hematuria   MS:  No joint pain or swelling.  No decreased range of  motion.  No back pain.    Physical Exam  BP 124/72 (BP Location: Left Arm, Cuff Size: Normal)   Pulse 77   Ht 5\' 2"  (1.575 m)   Wt 170 lb 6.4 oz (77.3 kg)   SpO2 95%   BMI 31.17 kg/m   GEN: A/Ox3; pleasant , NAD, elderly    HEENT:  [place]/AT,  EACs-clear, TMs-wnl, NOSE-clear, THROAT-clear, no lesions, no postnasal drip or exudate noted.   NECK:  Supple w/ fair ROM; no JVD; normal carotid impulses w/o bruits; no thyromegaly or nodules palpated; no lymphadenopathy.    RESP  Few trace rhonchi , no accessory muscle use, no dullness to percussion  CARD:  RRR, no m/r/g, no peripheral edema, pulses intact, no cyanosis or clubbing.  GI:   Soft & nt; nml bowel sounds; no organomegaly or masses detected.   Musco: Warm bil, no deformities or joint swelling noted.   Neuro: alert, no focal deficits noted.    Skin: Warm, no lesions or rashes    Lab Results:  CBC    Assessment & Plan:   COPD GOLD D Recent flare now resolving   Plan  Patient Instructions  Set up ONO .  Continue INCRUSE daily .  Continue on Prednisone 5mg  daily .  follow up Dr. Lamonte Sakai  In 6-8 weeks and As needed   Please contact office for sooner follow up if symptoms do not improve or worsen or seek emergency care      Chronic respiratory failure ([place]) Repeat ONO to see if nocturnal hypoxia persists.      Rexene Edison, NP 11/04/2016

## 2016-11-05 ENCOUNTER — Ambulatory Visit (HOSPITAL_COMMUNITY): Payer: Medicare Other | Attending: Cardiology

## 2016-11-05 ENCOUNTER — Other Ambulatory Visit: Payer: Self-pay

## 2016-11-05 DIAGNOSIS — J449 Chronic obstructive pulmonary disease, unspecified: Secondary | ICD-10-CM | POA: Insufficient documentation

## 2016-11-05 DIAGNOSIS — R079 Chest pain, unspecified: Secondary | ICD-10-CM | POA: Diagnosis not present

## 2016-11-14 ENCOUNTER — Other Ambulatory Visit: Payer: Self-pay | Admitting: Family Medicine

## 2016-11-14 ENCOUNTER — Ambulatory Visit
Admission: RE | Admit: 2016-11-14 | Discharge: 2016-11-14 | Disposition: A | Discharge status: Home / Self Care | Payer: Medicare Other | Source: Ambulatory Visit | Attending: Family Medicine | Admitting: Family Medicine

## 2016-11-14 DIAGNOSIS — J449 Chronic obstructive pulmonary disease, unspecified: Secondary | ICD-10-CM

## 2016-11-14 DIAGNOSIS — J189 Pneumonia, unspecified organism: Secondary | ICD-10-CM

## 2016-11-14 DIAGNOSIS — R079 Chest pain, unspecified: Secondary | ICD-10-CM | POA: Diagnosis not present

## 2016-11-20 ENCOUNTER — Encounter: Payer: Self-pay | Admitting: Adult Health

## 2016-11-20 DIAGNOSIS — J449 Chronic obstructive pulmonary disease, unspecified: Secondary | ICD-10-CM | POA: Diagnosis not present

## 2016-11-24 ENCOUNTER — Telehealth: Payer: Self-pay | Admitting: Adult Health

## 2016-11-24 NOTE — Telephone Encounter (Signed)
3.22.18 ONO on room air received  Per TP: no significant desats < 88%  Called spoke with patient, advised of ONO results per TP Pt voiced her understanding Pt did mention increased appetite on the prednisone 5mg  once daily but will continue at this dose as recommended and keep her follow up with RB on 4.26.18.  Pt aware to contact the office with any questions/concerns.  Nothing further needed; will sign off.

## 2016-12-25 ENCOUNTER — Ambulatory Visit (INDEPENDENT_AMBULATORY_CARE_PROVIDER_SITE_OTHER): Payer: Medicare Other | Admitting: Emergency Medicine

## 2016-12-25 ENCOUNTER — Encounter: Payer: Self-pay | Admitting: Emergency Medicine

## 2016-12-25 DIAGNOSIS — J449 Chronic obstructive pulmonary disease, unspecified: Secondary | ICD-10-CM | POA: Diagnosis not present

## 2016-12-25 DIAGNOSIS — J301 Allergic rhinitis due to pollen: Secondary | ICD-10-CM

## 2016-12-25 NOTE — Patient Instructions (Addendum)
Please continue your Incruse daily Take albuterol 2 puffs up to every 4 hours if needed for shortness of breath.  Please continue your singulair, flonase, zyrtec as you are taking them.  Consider adding mucinex 600mg  twice a day to help you clear mucous.  Follow with Dr Lamonte Sakai in 4 months or sooner if you have any problems.

## 2016-12-25 NOTE — Assessment & Plan Note (Signed)
Based on her report, symptoms, I can't tell that she improved on prednisone 5 mg daily. She would like to defer a higher dose at this time. Continue her Incruse, albuterol prn.

## 2016-12-25 NOTE — Assessment & Plan Note (Signed)
Please continue your singulair, flonase, zyrtec as you are taking them.  Consider adding mucinex 600mg  twice a day to help you clear mucous.  Follow with Dr Lamonte Sakai in 4 months or sooner if you have any problems.

## 2016-12-25 NOTE — Progress Notes (Signed)
Subjective:    Patient ID: Kathy Skinner, female    DOB: 13-Mar-1946, 71 y.o.   MRN: [id number]  HPI 71 yo former smoker (67 pk-yrs), hx COPD dx in ~2004, also carries dx ? asthma although suspect mostly COPD. Anxiety and panic attacks.   [name] 5/'14 at PCP:  FVC 2.0L 68 % FEV1 1.2L 49% Ratio 58%   ROV 06/26/16 -- this follow-up visit for very severe COPD with associated chronic hypoxemic resp failure. She reports that she has been limited by exertion after about 15-20 minutes. No exacerbations since last visit. She uses O2 at night, does not have anything to use for times when she does not sleep at home, has to be with family. Due to requalify for nocturnal oxygen with an ONO. She would like to have a POC for nights but I do not believe that DME will provide this for only nocturnal hypoxemia.  She remains on Anoro. She uses albuterol about every other day. She is having increased congestion with allergy season, increased cough. She remains on singulair. She is out of zyrtec and prilosec. She is on flonase nasal spray.    Acute OV  10/21/16 -- Kathy Skinner has a history of very severe COPD, chronic hypoxemic respiratory failure. She presents today for an acute visit. She has been having symptoms since late November, was dx with URI (? Flu) in November. Believes that she was treated with abx and prednisone although she is having a difficult time relating the history. A CT scan of the chest was apparently done at [facility]. She was just seen in ED 2/16 >> CXR clear. Treated with prednisone and doxycycline again, completing now. She believes that these have helped her some. Less CP, less cough. Current inhaled medication is Incruse, is tolerating but she does not believe it helps her as much as incruse. She is using albuterol about 4-5x a day.   ROV 12/25/16 -- follow-up visit for very severe COPD, chronic hypoxemic respiratory failure. Last visit her nocturnal oxygen was discontinued. She is  currently being managed on Incruse. She continues to have a lot of nasal congestion, chest congestion. She is on singulair, flonase, zyrtec. We started prednisone 5mg  in February. She is now of of it. She gained some wt, wasn;t sure that this helped her breathing or mucous. She is wearing a mask when she walks her dog outside.   No flowsheet data found.  Review of Systems  Constitutional: Negative for fever and unexpected weight change.  HENT: Positive for congestion. Negative for dental problem, ear pain, nosebleeds, postnasal drip, rhinorrhea, sinus pressure, sneezing, sore throat and trouble swallowing.   Eyes: Negative for redness, itching and visual disturbance.  Respiratory: Positive for cough and shortness of breath. Negative for chest tightness and wheezing.   Cardiovascular: Negative for palpitations and leg swelling.  Gastrointestinal: Negative for nausea and vomiting.  Genitourinary: Negative for dysuria.  Musculoskeletal: Negative for joint swelling.  Skin: Negative for rash.  Neurological: Negative for dizziness and headaches.  Hematological: Does not bruise/bleed easily.  Psychiatric/Behavioral: Negative for dysphoric mood. The patient is not nervous/anxious.        Objective:   Physical Exam Vitals:   12/25/16 1141  BP: 124/80  Pulse: 92  SpO2: 96%  Weight: 175 lb 3.2 oz (79.5 kg)  Height: 5\' 2"  (1.575 m)   Gen: Pleasant, overwt, in no distress,  normal affect  ENT: No lesions,  mouth clear,  oropharynx clear, no postnasal drip  Neck: No JVD,  no TMG, no carotid bruits, mild exp stridor  Lungs: No use of accessory muscles, B exp rhonchi and wheeze  Cardiovascular: RRR, heart sounds normal, no murmur or gallops, no peripheral edema  Musculoskeletal: No deformities, no cyanosis or clubbing  Neuro: alert, non focal  Skin: Warm, no lesions or rashes       Assessment & Plan:  COPD GOLD D Based on her report, symptoms, I can't tell that she improved on  prednisone 5 mg daily. She would like to defer a higher dose at this time. Continue her Incruse, albuterol prn.   Allergic rhinitis Please continue your singulair, flonase, zyrtec as you are taking them.  Consider adding mucinex 600mg  twice a day to help you clear mucous.  Follow with Dr Lamonte Sakai in 4 months or sooner if you have any problems.  Baltazar Apo, MD, PhD 12/25/2016, 12:05 PM [facility] [phone] or if no answer [phone]

## 2017-01-15 DIAGNOSIS — F322 Major depressive disorder, single episode, severe without psychotic features: Secondary | ICD-10-CM | POA: Diagnosis not present

## 2017-01-21 DIAGNOSIS — N3946 Mixed incontinence: Secondary | ICD-10-CM | POA: Diagnosis not present

## 2017-01-21 DIAGNOSIS — R5383 Other fatigue: Secondary | ICD-10-CM | POA: Diagnosis not present

## 2017-01-21 DIAGNOSIS — M8580 Other specified disorders of bone density and structure, unspecified site: Secondary | ICD-10-CM | POA: Diagnosis not present

## 2017-01-21 DIAGNOSIS — Z79899 Other long term (current) drug therapy: Secondary | ICD-10-CM | POA: Diagnosis not present

## 2017-01-21 DIAGNOSIS — Z0000 Encounter for general adult medical examination without abnormal findings: Secondary | ICD-10-CM | POA: Diagnosis not present

## 2017-01-21 DIAGNOSIS — E559 Vitamin D deficiency, unspecified: Secondary | ICD-10-CM | POA: Diagnosis not present

## 2017-01-21 DIAGNOSIS — E785 Hyperlipidemia, unspecified: Secondary | ICD-10-CM | POA: Diagnosis not present

## 2017-01-21 DIAGNOSIS — M2550 Pain in unspecified joint: Secondary | ICD-10-CM | POA: Diagnosis not present

## 2017-01-21 DIAGNOSIS — J449 Chronic obstructive pulmonary disease, unspecified: Secondary | ICD-10-CM | POA: Diagnosis not present

## 2017-01-21 DIAGNOSIS — I10 Essential (primary) hypertension: Secondary | ICD-10-CM | POA: Diagnosis not present

## 2017-01-29 ENCOUNTER — Telehealth: Payer: Self-pay | Admitting: Emergency Medicine

## 2017-01-29 NOTE — Telephone Encounter (Signed)
Pt requesting a new handicap placard form.  Pt wishes for this to be mailed to her upon completion.  Form filled out to best of my ability and placed in RB's look-at folder for signature with an envelope.    Will forward to Jonelle Sidle to follow up on as she is currently working with [facility].

## 2017-02-03 NOTE — Telephone Encounter (Signed)
Left detailed message that the form has been signed and placed in the mail today.

## 2017-02-04 ENCOUNTER — Other Ambulatory Visit: Payer: Self-pay | Admitting: Emergency Medicine

## 2017-02-10 ENCOUNTER — Encounter: Payer: Self-pay | Admitting: Neurology

## 2017-02-10 ENCOUNTER — Other Ambulatory Visit: Payer: Self-pay | Admitting: *Deleted

## 2017-02-10 DIAGNOSIS — G629 Polyneuropathy, unspecified: Secondary | ICD-10-CM

## 2017-02-10 NOTE — Progress Notes (Unsigned)
emg 

## 2017-02-18 ENCOUNTER — Telehealth: Payer: Self-pay | Admitting: Emergency Medicine

## 2017-02-18 NOTE — Telephone Encounter (Signed)
Pt called back stating she may be having SOB because of the inhaler (Incruse) she is using, seem like the inhaler is not working..pt states her SOB have increased and the inhaler is not helping...ert

## 2017-02-18 NOTE — Telephone Encounter (Signed)
Spoke with pt. I have advised her that if she having that much shortness of breath and her inhalers aren't working, that she needs to seek emergency care. Pt agreed and verbalized understanding. Nothing further was needed.

## 2017-02-19 ENCOUNTER — Other Ambulatory Visit: Payer: Self-pay | Admitting: Emergency Medicine

## 2017-02-24 ENCOUNTER — Ambulatory Visit (INDEPENDENT_AMBULATORY_CARE_PROVIDER_SITE_OTHER): Payer: Medicare Other | Admitting: Neurology

## 2017-02-24 DIAGNOSIS — G5622 Lesion of ulnar nerve, left upper limb: Secondary | ICD-10-CM

## 2017-02-24 DIAGNOSIS — G629 Polyneuropathy, unspecified: Secondary | ICD-10-CM | POA: Diagnosis not present

## 2017-02-24 NOTE — Procedures (Addendum)
[facility]  [place]  [place], [place] [address] Tel: [phone] Fax:  [phone] Test Date:  02/24/2017  Patient: Kathy Skinner DOB: [birth year] Physician: Narda Amber, DO  Sex: Female Height: 5\' 2"  Ref Phys: Edmonia Lynch, M.D.  ID#: [id number] Temp: 34.0C Technician:    Patient Complaints: This is a 71 year-old female with history of Guyon's canal release referred for evaluation of worsening paresthesias and weakness of the hand, now with Benedict's deformity.   NCV & EMG Findings: Extensive electrodiagnostic testing of the left upper extremity shows:  1. Left ulnar and dorsal ulnar cutaneous sensory responses are absent. Left median sensory response is within normal limits. 2. Left ulnar motor responses show reduced amplitude at the abductor digiti minimi and first dorsal interosseous muscles (L5.9, L6.0 mV) along with conduction velocity slowing across the elbow (A Elbow-B Elbow, L36, L34 m/s).  Left median motor responses within normal limits. 3. Chronic motor axon loss changes are seen affecting the first dorsal interosseous, abductor digiti minimi, and flexor digitorum profundus 4 & 5 muscles, without need active denervation.  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 Overall, these findings are severe in degree electrically.   ___________________________ Narda Amber, DO    Nerve Conduction Studies Anti Sensory Summary Table   Stim Site NR Peak (ms) Norm Peak (ms) P-T Amp (V) Norm P-T Amp  Left DorsCutan Anti Sensory (Dorsum 5th MC)  34C  Wrist NR  <3.2  >5  Left Median Anti Sensory (2nd Digit)  34C  Wrist    3.3 <3.8 19.7 >10  Left Ulnar Anti Sensory (5th Digit)  34C  Wrist NR  <3.2  >5   Motor Summary Table   Stim Site NR Onset (ms) Norm Onset (ms) O-P Amp (mV) Norm O-P Amp Site1 Site2 Delta-0 (ms) Dist (cm) Vel (m/s) Norm Vel (m/s)  Left Median Motor (Abd  Poll Brev)  34C  Wrist    3.5 <4.0 5.5 >5 Elbow Wrist 4.8 27.0 56 >50  Elbow    8.3  5.5         Left Ulnar Motor (Abd Dig Minimi)  34C  Wrist    2.7 <3.1 5.9 >7 B Elbow Wrist 3.6 23.0 64 >50  B Elbow    6.3  5.6  A Elbow B Elbow 2.8 10.0 36 >50  A Elbow    9.1  4.8         Left Ulnar (FDI) Motor (1st DI)  34C  Wrist    4.3 <4.5 6.0 >7 B Elbow Wrist 4.2 23.0 55 >50  B Elbow    8.5  5.8  A Elbow B Elbow 2.9 10.0 34 >50  A Elbow    11.4  5.4          EMG   Side Muscle Ins Act Fibs Psw Fasc Number Recrt Dur Dur. Amp Amp. Poly Poly. Comment  Left FlexDigProf 4,5 Nml Nml Nml Nml 2- Rapid Most 1+ Most 1+ Many 1+ N/A  Left ABD Dig Min Nml Nml Nml Nml 3- Rapid All 1+ All 1+ Some 1+ ATR  Left 1stDorInt Nml Nml Nml Nml 3- Rapid All 1+ All 2+ Most 1+ ATR  Left Ext Indicis Nml Nml Nml Nml Nml Nml Nml Nml Nml Nml Nml Nml N/A  Left PronatorTeres Nml Nml Nml Nml Nml Nml Nml Nml Nml Nml Nml Nml N/A  Left Triceps Nml Nml Nml Nml Nml Nml  Nml Nml Nml Nml Nml Nml N/A      Waveforms:

## 2017-02-27 ENCOUNTER — Ambulatory Visit: Payer: Medicare Other | Admitting: Emergency Medicine

## 2017-03-02 ENCOUNTER — Ambulatory Visit (INDEPENDENT_AMBULATORY_CARE_PROVIDER_SITE_OTHER): Payer: Medicare Other | Admitting: Acute Care

## 2017-03-02 ENCOUNTER — Encounter: Payer: Self-pay | Admitting: Acute Care

## 2017-03-02 DIAGNOSIS — J449 Chronic obstructive pulmonary disease, unspecified: Secondary | ICD-10-CM | POA: Diagnosis not present

## 2017-03-02 MED ORDER — FLUTICASONE-UMECLIDIN-VILANT 100-62.5-25 MCG/INH IN AEPB: 1.0000 | INHALATION_SPRAY | Freq: Once | RESPIRATORY_TRACT | 0 refills | Status: DC

## 2017-03-02 MED ORDER — FLUTICASONE-UMECLIDIN-VILANT 100-62.5-25 MCG/INH IN AEPB: 1.0000 | INHALATION_SPRAY | Freq: Once | RESPIRATORY_TRACT | 2 refills | Status: DC

## 2017-03-02 NOTE — Progress Notes (Signed)
History of Present Illness Kathy Skinner is a 71 y.o. female former smoker ( 50 pack years) with hx COPD dx in ~2004, also carries dx ? asthma although suspect mostly COPD. She also has a history of Anxiety and panic attacks.    03/02/2017 Visit for Oxygen Qualification: Pt. Presents for oxygen qualification.She is currently not wearing oxygen  at night. She states that the insurance company would not pay for it because the home ONO's done did not indicate need for nocturnal oxygen. She feels she needs it during the day, in addition to at night.. She presents today stating she is using her rescue inhaler 4-5 times daily, 4-5 puffs at a time.She also states she does not get adequate relief from her Incruse maintenance inhaler. Pt. States she is having period of time when she can't think clearly. Pt. States she is not using her Incruse. She has failed [place], and now states she has failed these medications. She states she was using Spiriva, which she used for 12 years and states needed to be upgraded even though it worked well for her.. She states she needs oxygen during the day.She can tell when she is unable to think clearly. She needs oxygen for use in the heat.She denies chest pain, fever, orthopnea of hemoptysis.She is coughing up light yellow secretions. She is compliant with her Singulair, Flonase and Zyrtec.  Test Results:  CBC Latest Ref Rng & Units 10/17/2016 01/15/2013  WBC 4.0 - 10.5 K/uL 7.5 9.5  Hemoglobin 12.0 - 15.0 g/dL 16.1(H) 15.1(H)  Hematocrit 36.0 - 46.0 % 46.9(H) 43.8  Platelets 150 - 400 K/uL 191 217    BMP Latest Ref Rng & Units 10/17/2016 01/15/2013  Glucose 65 - 99 mg/dL 91 95  BUN 6 - 20 mg/dL 9 15  Creatinine 0.44 - 1.00 mg/dL 0.87 0.80  Sodium 135 - 145 mmol/L 140 142  Potassium 3.5 - 5.1 mmol/L 4.5 4.1  Chloride 101 - 111 mmol/L 106 104  CO2 22 - 32 mmol/L 26 29  Calcium 8.9 - 10.3 mg/dL 9.5 9.6    PFT    Component Value Date/Time   FEV1PRE 1.31 05/04/2015  1257   FEV1POST 1.41 05/04/2015 1257   FVCPRE 2.77 05/04/2015 1257   FVCPOST 2.75 05/04/2015 1257   TLC 5.87 05/04/2015 1257   DLCOUNC 12.63 05/04/2015 1257   PREFEV1FVCRT 47 05/04/2015 1257   PSTFEV1FVCRT 51 05/04/2015 1257      Past medical hx Past Medical History:  Diagnosis Date  . Anxiety   . Asthma   . COPD (chronic obstructive pulmonary disease) ([place])   . Panic attacks      Social History  Substance Use Topics  . Smoking status: Former Smoker    Packs/day: 1.00    Years: 50.00    Types: Cigarettes    Quit date: 12/19/1999  . Smokeless tobacco: Never Used  . Alcohol use No    Tobacco Cessation: Former smoker quit in 2001, 50 pack year smoking history.  Past surgical hx, Family hx, Social hx all reviewed.  Current Outpatient Prescriptions on File Prior to Visit  Medication Sig  . albuterol (PROVENTIL HFA;VENTOLIN HFA) 108 (90 Base) MCG/ACT inhaler Inhale 2 puffs into the lungs every 6 (six) hours as needed for wheezing.  Marland Kitchen albuterol (PROVENTIL) (2.5 MG/3ML) 0.083% nebulizer solution Take 3 mLs (2.5 mg total) by nebulization every 6 (six) hours as needed for wheezing.  Marland Kitchen ALPRAZolam (XANAX) 1 MG tablet Take 0.5-1 mg by mouth daily as needed.  For anxiety  . cetirizine (ZYRTEC) 10 MG tablet TAKE ONE TABLET BY MOUTH DAILY  . fluticasone (FLONASE) 50 MCG/ACT nasal spray Place 2 sprays into both nostrils daily.  . montelukast (SINGULAIR) 10 MG tablet TAKE 1 TABLET (10 MG TOTAL) BY MOUTH EVERY MORNING.  Marland Kitchen omeprazole (PRILOSEC) 20 MG capsule TAKE ONE CAPSULE BY MOUTH ONE TIME A DAY  . sertraline (ZOLOFT) 100 MG tablet Take 150 mg by mouth daily.   Marland Kitchen umeclidinium bromide (INCRUSE ELLIPTA) 62.5 MCG/INH AEPB Inhale 1 puff into the lungs daily.   No current facility-administered medications on file prior to visit.      Allergies  Allergen Reactions  . Codeine Nausea Only  . Neurontin [Gabapentin]     seizures  . Tramadol Swelling    Review Of  Systems:  Constitutional:   No  weight loss, night sweats,  Fevers, chills, fatigue, or  lassitude.  HEENT:   No headaches,  Difficulty swallowing,  Tooth/dental problems, or  Sore throat,                No sneezing, itching, ear ache, nasal congestion, post nasal drip,   CV:  No chest pain,  Orthopnea, PND, swelling in lower extremities, anasarca, dizziness, palpitations, syncope.   GI  No heartburn, indigestion, abdominal pain, nausea, vomiting, diarrhea, change in bowel habits, loss of appetite, bloody stools.   Resp: + shortness of breath with exertion or at rest.Especially in the humidity and heat.  + excess mucus, no productive cough,  + non-productive cough,  +coughing up of blood.  No change in color of mucus.  No wheezing.  No chest wall deformity  Skin: no rash or lesions.  GU: no dysuria, change in color of urine, no urgency or frequency.  No flank pain, no hematuria   MS:  No joint pain or swelling.  No decreased range of motion.  No back pain.  Psych:  No change in mood or affect. No depression or anxiety.  No memory loss.   Vital Signs BP 126/80 (BP Location: Left Arm, Cuff Size: Normal)   Pulse 88   Ht 5\' 3"  (1.6 m)   Wt 173 lb (78.5 kg)   SpO2 93%   BMI 30.65 kg/m    Physical Exam:  General- No distress,  A&Ox3, anxious ENT: No sinus tenderness, TM clear, pale nasal mucosa, no oral exudate,no post nasal drip, no LAN Cardiac: S1, S2, regular rate and rhythm, no murmur Chest: No wheeze/ rales/ dullness; no accessory muscle use, no nasal flaring, no sternal retractions, coarse throughout, diminished per bases. Abd.: Soft Non-tender, obese Ext: No clubbing cyanosis, edema Neuro:  normal strength, deconditioned at baseline Skin: No rashes, warm and dry Psych: Anxious   Assessment/Plan  COPD GOLD D Continued Dyspnea Not using maintenance Over using rescue Suspect anxiety playing a part in medication usage Plan: We will do a therapeutic trial of  Trelegy. Use this 1 puff once daily. This will replace the Incruse as your maintenance inhaler. Continue using rescue inhaler for breakthrough shortness of breath. This is 1-2 puffs up to 4 times daily. Please continue your singulair, flonase, zyrtec as you are taking them.  Consider adding mucinex 600mg  twice a day to help you clear mucous.  We will walk you today to see if you qualify for oxygen. Follow up in 1 month to assess effectiveness of Trelegy. Consider repeat in lab ONO as patient states she does not know how to do the test well herself at home.  Please contact office for sooner follow up if symptoms do not improve or worsen or seek emergency care       Magdalen Spatz, NP 03/02/2017  9:53 AM

## 2017-03-02 NOTE — Patient Instructions (Addendum)
It is nice to see you today. We will do a therapeutic trial of Trelegy. Use this 1 puff once daily. This will replace the Incruse as your maintenance inhaler. Continue using rescue inhaler for breakthrough shortness of breath. This is 1-2 puffs up to 4 times daily. Please continue your singulair, flonase, zyrtec as you are taking them.  Consider adding mucinex 600mg  twice a day to help you clear mucous.  We will walk you today to see if you qualify for oxygen. Follow up in 1 month to assess effectiveness of Trelegy. Consider repeat in lab ONO as patient states she does not know how to do the test well herself at home. Please contact office for sooner follow up if symptoms do not improve or worsen or seek emergency care

## 2017-03-02 NOTE — Assessment & Plan Note (Signed)
Continued Dyspnea Not using maintenance Over using rescue Suspect anxiety playing a part in medication usage Plan: We will do a therapeutic trial of Trelegy. Use this 1 puff once daily. This will replace the Incruse as your maintenance inhaler. Continue using rescue inhaler for breakthrough shortness of breath. This is 1-2 puffs up to 4 times daily. Please continue your singulair, flonase, zyrtec as you are taking them.  Consider adding mucinex 600mg  twice a day to help you clear mucous.  We will walk you today to see if you qualify for oxygen. Follow up in 1 month to assess effectiveness of Trelegy. Consider repeat in lab ONO as patient states she does not know how to do the test well herself at home. Please contact office for sooner follow up if symptoms do not improve or worsen or seek emergency care

## 2017-03-02 NOTE — Addendum Note (Signed)
Addended by: Jannette Spanner on: 03/02/2017 10:02 AM   Modules accepted: Orders

## 2017-03-02 NOTE — Addendum Note (Signed)
Addended by: Jannette Spanner on: 03/02/2017 11:23 AM   Modules accepted: Orders

## 2017-04-07 ENCOUNTER — Encounter: Payer: Self-pay | Admitting: Adult Health

## 2017-04-07 ENCOUNTER — Other Ambulatory Visit: Payer: Self-pay

## 2017-04-07 ENCOUNTER — Ambulatory Visit (INDEPENDENT_AMBULATORY_CARE_PROVIDER_SITE_OTHER): Payer: Medicare Other | Admitting: Adult Health

## 2017-04-07 DIAGNOSIS — J449 Chronic obstructive pulmonary disease, unspecified: Secondary | ICD-10-CM

## 2017-04-07 DIAGNOSIS — J301 Allergic rhinitis due to pollen: Secondary | ICD-10-CM | POA: Diagnosis not present

## 2017-04-07 MED ORDER — GUAIFENESIN ER 600 MG PO TB12: 600.0000 mg | ORAL_TABLET | Freq: Two times a day (BID) | ORAL | 2 refills | Status: DC

## 2017-04-07 MED ORDER — UMECLIDINIUM BROMIDE 62.5 MCG/INH IN AEPB: 1.0000 | INHALATION_SPRAY | Freq: Every day | RESPIRATORY_TRACT | 5 refills | Status: DC

## 2017-04-07 NOTE — Progress Notes (Signed)
@Patient  ID: Kathy Skinner, female    DOB: 06/06/1946, 71 y.o.   MRN: [id number]  Chief Complaint  Patient presents with  . Follow-up    COPD     Referring provider: No ref. provider found  HPI: 71 yo female former smoker followed for COPD GOLD D   TEST  [name] 5/'14 at PCP: FVC 2.0L 68 %, FEV1 1.2L 49%, Ratio 58%  04/07/2017 Follow up : COPD  Patient returns for a one-month follow-up. Says that her breathing is improved since last visit. She remains on Incruse daily. She was tried on TRELEGY but can not remember if it helped or not.  Mucinex is really helping her . Mucus is much thinner, and is easier to get up.  Seems like things are going in the right directions. Feels breathing is slightly better last few weeks.  She denies any chest pain, orthopnea, PND, or increased leg swelling.  Patient had been on low-dose prednisone earlier this year but was unable to determine if it was making any benefit. She stopped this in February 2018.  Has allergic rhinitis. Denies any flare of nasal congestion or drainage. Posterior basal singular, Flonase and Zyrtec..   Allergies  Allergen Reactions  . Codeine Nausea Only  . Neurontin [Gabapentin]     seizures  . Tramadol Swelling    Immunization History  Administered Date(s) Administered  . Influenza Split 05/31/2013, 07/04/2014  . Influenza Whole 07/02/2012  . Influenza, High Dose Seasonal PF 07/21/2016  . Influenza-Unspecified 05/09/2015  . Pneumococcal Conjugate-13 08/03/2014  . Pneumococcal Polysaccharide-23 07/02/2012    Past Medical History:  Diagnosis Date  . Anxiety   . Asthma   . COPD (chronic obstructive pulmonary disease) ([place])   . Panic attacks     Tobacco History: History  Smoking Status  . Former Smoker  . Packs/day: 1.00  . Years: 50.00  . Types: Cigarettes  . Quit date: 12/19/1999  Smokeless Tobacco  . Never Used   Counseling given: Not Answered   Outpatient Encounter Prescriptions as of 04/07/2017    Medication Sig  . albuterol (PROVENTIL HFA;VENTOLIN HFA) 108 (90 Base) MCG/ACT inhaler Inhale 2 puffs into the lungs every 6 (six) hours as needed for wheezing.  Marland Kitchen albuterol (PROVENTIL) (2.5 MG/3ML) 0.083% nebulizer solution Take 3 mLs (2.5 mg total) by nebulization every 6 (six) hours as needed for wheezing.  Marland Kitchen ALPRAZolam (XANAX) 1 MG tablet Take 0.5-1 mg by mouth 2 (two) times daily. For anxiety  . cetirizine (ZYRTEC) 10 MG tablet TAKE ONE TABLET BY MOUTH DAILY  . fluticasone (FLONASE) 50 MCG/ACT nasal spray Place 2 sprays into both nostrils daily.  . montelukast (SINGULAIR) 10 MG tablet TAKE 1 TABLET (10 MG TOTAL) BY MOUTH EVERY MORNING.  Marland Kitchen omeprazole (PRILOSEC) 20 MG capsule TAKE ONE CAPSULE BY MOUTH ONE TIME A DAY  . sertraline (ZOLOFT) 100 MG tablet Take 150 mg by mouth daily.   . [DISCONTINUED] guaiFENesin (ROBITUSSIN) 100 MG/5ML liquid Take 200 mg by mouth 3 (three) times daily as needed for cough.  . [DISCONTINUED] umeclidinium bromide (INCRUSE ELLIPTA) 62.5 MCG/INH AEPB Inhale 1 puff into the lungs daily.   No facility-administered encounter medications on file as of 04/07/2017.      Review of Systems  Constitutional:   No  weight loss, night sweats,  Fevers, chills, fatigue, or  lassitude.  HEENT:   No headaches,  Difficulty swallowing,  Tooth/dental problems, or  Sore throat,  No sneezing, itching, ear ache, nasal congestion, post nasal drip,   CV:  No chest pain,  Orthopnea, PND, swelling in lower extremities, anasarca, dizziness, palpitations, syncope.   GI  No heartburn, indigestion, abdominal pain, nausea, vomiting, diarrhea, change in bowel habits, loss of appetite, bloody stools.   Resp: No shortness of breath with exertion or at rest.  No excess mucus, no productive cough,  No non-productive cough,  No coughing up of blood.  No change in color of mucus.  No wheezing.  No chest wall deformity  Skin: no rash or lesions.  GU: no dysuria, change in color of  urine, no urgency or frequency.  No flank pain, no hematuria   MS:  No joint pain or swelling.  No decreased range of motion.  No back pain.    Physical Exam  BP 120/70 (BP Location: Left Arm, Patient Position: Sitting, Cuff Size: Normal)   Pulse 78   Ht 5\' 2"  (1.575 m)   Wt 180 lb (81.6 kg)   SpO2 93%   BMI 32.92 kg/m   GEN: A/Ox3; pleasant , NAD, elderly    HEENT:  [place]/AT,  EACs-clear, TMs-wnl, NOSE-clear, THROAT-clear, no lesions, no postnasal drip or exudate noted.   NECK:  Supple w/ fair ROM; no JVD; normal carotid impulses w/o bruits; no thyromegaly or nodules palpated; no lymphadenopathy.    RESP  Few trace rhonchi ,  no accessory muscle use, no dullness to percussion  CARD:  RRR, no m/r/g, no peripheral edema, pulses intact, no cyanosis or clubbing.  GI:   Soft & nt; nml bowel sounds; no organomegaly or masses detected.   Musco: Warm bil, no deformities or joint swelling noted.   Neuro: alert, no focal deficits noted.    Skin: Warm, no lesions or rashes    Lab Results:  CBC   BNP No results found for: BNP  ProBNP No results found for: PROBNP  Imaging: No results found.   Assessment & Plan:   COPD GOLD D Compensated on present regimen .   Plan  Patient Instructions  Continue INCRUSE daily . Rinse after use.  Mucinex DM Twice daily  As needed  Cough /congestion  Follow up Dr. Lamonte Sakai  In 4-6 months and As needed   Please contact office for sooner follow up if symptoms do not improve or worsen or seek emergency care      Allergic rhinitis Cont on current regimen      Rexene Edison, NP 04/07/2017

## 2017-04-07 NOTE — Assessment & Plan Note (Signed)
Compensated on present regimen .   Plan  Patient Instructions  Continue INCRUSE daily . Rinse after use.  Mucinex DM Twice daily  As needed  Cough /congestion  Follow up Dr. Lamonte Sakai  In 4-6 months and As needed   Please contact office for sooner follow up if symptoms do not improve or worsen or seek emergency care

## 2017-04-07 NOTE — Assessment & Plan Note (Signed)
Cont on current regimen  

## 2017-04-07 NOTE — Patient Instructions (Addendum)
Continue INCRUSE daily . Rinse after use.  Mucinex DM Twice daily  As needed  Cough /congestion  Follow up Dr. Lamonte Sakai  In 4-6 months and As needed   Please contact office for sooner follow up if symptoms do not improve or worsen or seek emergency care

## 2017-04-16 DIAGNOSIS — F322 Major depressive disorder, single episode, severe without psychotic features: Secondary | ICD-10-CM | POA: Diagnosis not present

## 2017-04-30 ENCOUNTER — Ambulatory Visit (INDEPENDENT_AMBULATORY_CARE_PROVIDER_SITE_OTHER): Payer: Medicare Other | Admitting: Emergency Medicine

## 2017-04-30 ENCOUNTER — Encounter: Payer: Self-pay | Admitting: Emergency Medicine

## 2017-04-30 DIAGNOSIS — K219 Gastro-esophageal reflux disease without esophagitis: Secondary | ICD-10-CM | POA: Diagnosis not present

## 2017-04-30 DIAGNOSIS — J301 Allergic rhinitis due to pollen: Secondary | ICD-10-CM | POA: Diagnosis not present

## 2017-04-30 DIAGNOSIS — J449 Chronic obstructive pulmonary disease, unspecified: Secondary | ICD-10-CM

## 2017-04-30 NOTE — Patient Instructions (Addendum)
We will continue your Trelegy as you are taking it Continue your flonase, zyrtec, singulair as you are taking them  Continue omeprazole 20 mg daily We discussed the flu shot today. You would likely benefit from having this if you're willing to do so. Follow with Dr Lamonte Sakai in 6 months or sooner if you have any problems

## 2017-04-30 NOTE — Assessment & Plan Note (Signed)
It turns out she has been on Trelegy not Incruse. She feels that she is benefiting. We will continue this. Continue albuterol as needed. She did not desaturate with exertion on her last ambulatory oximetry.

## 2017-04-30 NOTE — Assessment & Plan Note (Signed)
With associated chronic cough. Continue Zyrtec, Flonase, Singulair

## 2017-04-30 NOTE — Assessment & Plan Note (Signed)
Contributing to her cough. Continue omeprazole 20 mg daily

## 2017-04-30 NOTE — Progress Notes (Signed)
  Subjective:    Patient ID: Kathy Skinner, female    DOB: 10-24-1945, 71 y.o.   MRN: [id number]  HPI 71 yo former smoker (30 pk-yrs), hx COPD dx in ~2004, also carries dx ? asthma although suspect mostly COPD. Anxiety and panic attacks.   [name] 5/'14 at PCP:  FVC 2.0L 68 % FEV1 1.2L 49% Ratio 58%  ROV 12/25/16 -- follow-up visit for very severe COPD, chronic hypoxemic respiratory failure. Last visit her nocturnal oxygen was discontinued. She is currently being managed on Incruse. She continues to have a lot of nasal congestion, chest congestion. She is on singulair, flonase, zyrtec. We started prednisone 5mg  in February. She is now of of it. She gained some wt, wasn;t sure that this helped her breathing or mucous. She is wearing a mask when she walks her dog outside.   ROV 04/30/17 -- Patient with a history of very severe COPD, chronic hypoxemic respiratory failure. She has been on chronic prednisone in the past, not currently. She is confused about meds - we had indicated that we could continue Incruse, but she is actually taking Trelegy. She uses albuterol approximately 2x a day. She believes that her breathing is better, still becomes quite drained in the heat. She does continue to have cough, non-productive. Often wakes her at night. She doesn't want the flu shot - believes it gave her PNA in the past  Her allergy regimen includes Zyrtec, Flonase,Singulair. Also on omeprazole 20 mg daily   No flowsheet data found.  Review of Systems  Constitutional: Negative for fever and unexpected weight change.  HENT: Positive for congestion. Negative for dental problem, ear pain, nosebleeds, postnasal drip, rhinorrhea, sinus pressure, sneezing, sore throat and trouble swallowing.   Eyes: Negative for redness, itching and visual disturbance.  Respiratory: Positive for cough and shortness of breath. Negative for chest tightness and wheezing.   Cardiovascular: Negative for palpitations and leg swelling.   Gastrointestinal: Negative for nausea and vomiting.  Genitourinary: Negative for dysuria.  Musculoskeletal: Negative for joint swelling.  Skin: Negative for rash.  Neurological: Negative for dizziness and headaches.  Hematological: Does not bruise/bleed easily.  Psychiatric/Behavioral: Negative for dysphoric mood. The patient is not nervous/anxious.        Objective:   Physical Exam Vitals:   04/30/17 1129 04/30/17 1130  BP:  120/82  Pulse:  73  SpO2:  94%  Weight: 181 lb (82.1 kg)   Height: 5\' 2"  (1.575 m)    Gen: Pleasant, overwt, in no distress,  normal affect  ENT: No lesions,  mouth clear,  oropharynx clear, no postnasal drip  Neck: No JVD, no TMG, no carotid bruits, mild insp and exp stridor  Lungs: No use of accessory muscles, B exp rhonchi and wheeze  Cardiovascular: RRR, heart sounds normal, no murmur or gallops, no peripheral edema  Musculoskeletal: No deformities, no cyanosis or clubbing  Neuro: alert, non focal  Skin: Warm, no lesions or rashes       Assessment & Plan:  COPD GOLD D It turns out she has been on Trelegy not Incruse. She feels that she is benefiting. We will continue this. Continue albuterol as needed. She did not desaturate with exertion on her last ambulatory oximetry.  Allergic rhinitis With associated chronic cough. Continue Zyrtec, Flonase, Singulair  GERD (gastroesophageal reflux disease) Contributing to her cough. Continue omeprazole 20 mg daily  Baltazar Apo, MD, PhD 04/30/2017, 11:57 AM  Pulmonary and Critical Care [phone] or if no answer [phone]

## 2017-06-02 ENCOUNTER — Other Ambulatory Visit: Payer: Self-pay | Admitting: *Deleted

## 2017-06-02 MED ORDER — MONTELUKAST SODIUM 10 MG PO TABS: ORAL_TABLET | ORAL | 1 refills | Status: DC

## 2017-06-05 ENCOUNTER — Telehealth: Payer: Self-pay | Admitting: Emergency Medicine

## 2017-06-05 MED ORDER — CETIRIZINE HCL 10 MG PO TABS: 10.0000 mg | ORAL_TABLET | Freq: Every day | ORAL | 3 refills | Status: DC

## 2017-06-05 MED ORDER — FLUTICASONE-UMECLIDIN-VILANT 100-62.5-25 MCG/INH IN AEPB: 1.0000 | INHALATION_SPRAY | Freq: Every day | RESPIRATORY_TRACT | 3 refills | Status: DC

## 2017-06-05 NOTE — Telephone Encounter (Signed)
Called and spoke with Beverlee Nims from [facility] and she is aware of refills that the pt was needing.  Nothing further is needed

## 2017-06-09 ENCOUNTER — Other Ambulatory Visit: Payer: Self-pay | Admitting: Emergency Medicine

## 2017-06-12 ENCOUNTER — Telehealth: Payer: Self-pay | Admitting: Emergency Medicine

## 2017-06-12 NOTE — Telephone Encounter (Signed)
Pt states she has been out of the montelukast (SINGULAIR) 10 MG tablet For a week.  [facility].  Pt would like a 15 day supply to get her through until she gets her script.  [place], [place]

## 2017-06-16 NOTE — Telephone Encounter (Signed)
The prescription was sent to [facility] for the 15 day supply.

## 2017-07-15 ENCOUNTER — Other Ambulatory Visit: Payer: Self-pay | Admitting: Emergency Medicine

## 2017-07-28 ENCOUNTER — Other Ambulatory Visit: Payer: Self-pay | Admitting: Acute Care

## 2017-08-04 ENCOUNTER — Other Ambulatory Visit: Payer: Self-pay | Admitting: Emergency Medicine

## 2017-09-02 ENCOUNTER — Telehealth: Payer: Self-pay | Admitting: Emergency Medicine

## 2017-09-02 DIAGNOSIS — J42 Unspecified chronic bronchitis: Secondary | ICD-10-CM

## 2017-09-02 NOTE — Telephone Encounter (Signed)
Yes - Ok to send 

## 2017-09-02 NOTE — Telephone Encounter (Signed)
Received a telephone advice record from where the patient had called during the night on 08/29/17.   She stated that her current nebulizer machine is not working. When she attempts to turn it on, it does nothing.   She wants to know if RB would be willing to send an order to [facility] for her to receive a new machine. She stated that she has had the current machine for a while.   RB, please advise if it is ok for [place] to place an order for a neb machine. Thanks!

## 2017-09-02 NOTE — Telephone Encounter (Signed)
Spoke with patient. She is aware of order. Will send order to [facility]. Nothing else needed at time of call.

## 2017-10-22 ENCOUNTER — Other Ambulatory Visit: Payer: Self-pay | Admitting: Emergency Medicine

## 2017-11-24 ENCOUNTER — Other Ambulatory Visit: Payer: Self-pay | Admitting: Adult Health

## 2017-11-24 ENCOUNTER — Other Ambulatory Visit: Payer: Self-pay | Admitting: Emergency Medicine

## 2018-01-22 ENCOUNTER — Other Ambulatory Visit: Payer: Self-pay | Admitting: Adult Health

## 2018-01-29 ENCOUNTER — Other Ambulatory Visit: Payer: Self-pay | Admitting: Family Medicine

## 2018-01-29 DIAGNOSIS — Z139 Encounter for screening, unspecified: Secondary | ICD-10-CM

## 2018-02-05 ENCOUNTER — Emergency Department (HOSPITAL_COMMUNITY): Payer: Medicare Other

## 2018-02-05 ENCOUNTER — Other Ambulatory Visit: Payer: Self-pay

## 2018-02-05 ENCOUNTER — Emergency Department (HOSPITAL_COMMUNITY)
Admission: EM | Admit: 2018-02-05 | Discharge: 2018-02-05 | Disposition: A | Discharge status: Home / Self Care | Payer: Medicare Other | Attending: Emergency Medicine | Admitting: Emergency Medicine

## 2018-02-05 ENCOUNTER — Encounter (HOSPITAL_COMMUNITY): Payer: Self-pay

## 2018-02-05 DIAGNOSIS — J449 Chronic obstructive pulmonary disease, unspecified: Secondary | ICD-10-CM | POA: Diagnosis not present

## 2018-02-05 DIAGNOSIS — S329XXA Fracture of unspecified parts of lumbosacral spine and pelvis, initial encounter for closed fracture: Secondary | ICD-10-CM | POA: Diagnosis not present

## 2018-02-05 DIAGNOSIS — Z79899 Other long term (current) drug therapy: Secondary | ICD-10-CM | POA: Insufficient documentation

## 2018-02-05 DIAGNOSIS — Y92007 Garden or yard of unspecified non-institutional (private) residence as the place of occurrence of the external cause: Secondary | ICD-10-CM | POA: Diagnosis not present

## 2018-02-05 DIAGNOSIS — R52 Pain, unspecified: Secondary | ICD-10-CM | POA: Diagnosis not present

## 2018-02-05 DIAGNOSIS — W010XXA Fall on same level from slipping, tripping and stumbling without subsequent striking against object, initial encounter: Secondary | ICD-10-CM | POA: Diagnosis not present

## 2018-02-05 DIAGNOSIS — M5489 Other dorsalgia: Secondary | ICD-10-CM | POA: Diagnosis not present

## 2018-02-05 DIAGNOSIS — Z87891 Personal history of nicotine dependence: Secondary | ICD-10-CM | POA: Diagnosis not present

## 2018-02-05 DIAGNOSIS — Y998 Other external cause status: Secondary | ICD-10-CM | POA: Diagnosis not present

## 2018-02-05 DIAGNOSIS — Y93K1 Activity, walking an animal: Secondary | ICD-10-CM | POA: Insufficient documentation

## 2018-02-05 DIAGNOSIS — W19XXXA Unspecified fall, initial encounter: Secondary | ICD-10-CM | POA: Diagnosis not present

## 2018-02-05 DIAGNOSIS — S79912A Unspecified injury of left hip, initial encounter: Secondary | ICD-10-CM | POA: Diagnosis present

## 2018-02-05 DIAGNOSIS — R0902 Hypoxemia: Secondary | ICD-10-CM | POA: Diagnosis not present

## 2018-02-05 MED ORDER — OXYCODONE-ACETAMINOPHEN 5-325 MG PO TABS: 1.0000 | ORAL_TABLET | ORAL | 0 refills | Status: AC | PRN

## 2018-02-05 MED ORDER — OXYCODONE-ACETAMINOPHEN 5-325 MG PO TABS
1.0000 | ORAL_TABLET | ORAL | Status: DC | PRN
  Administered 2018-02-05: 1 via ORAL
  Filled 2018-02-05: qty 1

## 2018-02-05 NOTE — ED Notes (Signed)
Bed: XM58 Expected date:  Expected time:  Means of arrival:  Comments: 72 F L hip pain

## 2018-02-05 NOTE — Discharge Instructions (Addendum)
Nice to meet you Ms. Kathy Skinner.  You have a fracture to a portion of your pelvis on the left side. You will need to use a walker and you can walk and bear weight on that leg as you are able to tolerate with the pain. It will be painful and we have prescribed a course of pain medicine to help this. Be sure to ask for help in moving around. We discussed with the doctors at [facility] who agree with the plan and would like to see you in the office in two weeks. If you do not hear from them in the next couple of days, we have provided their office number. You should also make an appointment with your primary care doctor soon as well. You may need more medications to help with osteoporosis to decrease the chances of this happening again.

## 2018-02-05 NOTE — ED Provider Notes (Signed)
Attestation: I saw and evaluated the patient, reviewed the resident's note and I agree with the findings and plan.  Some pain on movement of left hip but no shortening or rotation of left lower extremity.  Tenderness over left superior and inferior rami suspicious for pelvic fracture.  Radiographs confirm this.     Venecia Mehl, Jenny Reichmann, MD 02/05/18 [phone]

## 2018-02-05 NOTE — Discharge Planning (Signed)
Hessie Varone, RN, BSN, NCM [phone] Pt qualifies for DME rolling walker.  DME  ordered through [facility].  Karen Nussbaum of [facility] notified to deliver rolling walker to pt room prior to D/C home.  

## 2018-02-05 NOTE — ED Provider Notes (Signed)
[place] DEPT Provider Note   CSN: [id number] Arrival date & time: 02/05/18  6789   History   Chief Complaint Chief Complaint  Patient presents with  . Hip Injury    HPI Kathy Skinner is a 72 y.o. female with a past medical history of COPD, anxiety who presented to the ED with L hip pain following a fall.   She reports going outside this morning to take her puppy out to the bathroom.  She was on a sidewalk and tripped off the edge of the sidewalk and fell onto grass.  She fell onto her left side and immediately felt pain in her left hip and felt she could not move her leg well.  She denies any other injuries, did not hit her head.  She denies any symptoms prior to the fall including near syncope, palpitations, shortness of breath.  She notes a history of osteopenia diagnosed 2 years ago, told this had recently advanced osteoporosis.  She is currently on teriparatide, no other vitamin D supplement or bisphosphonate to her knowledge. Pt lives with her sister.    Past Medical History:  Diagnosis Date  . Anxiety   . Asthma   . COPD (chronic obstructive pulmonary disease) ([place])   . Panic attacks     Patient Active Problem List   Diagnosis Date Noted  . Chronic respiratory failure ([place]) 11/04/2016  . GERD (gastroesophageal reflux disease) 12/17/2015  . Hypersomnolence 02/13/2015  . COPD GOLD D 02/03/2013  . Allergic rhinitis 02/03/2013  . Generalized anxiety disorder 02/03/2013    Past Surgical History:  Procedure Laterality Date  . TUBAL LIGATION       OB History   None      Home Medications    Prior to Admission medications   Medication Sig Start Date End Date Taking? Authorizing Provider  ALPRAZolam Duanne Moron) 1 MG tablet Take 0.5-1 mg by mouth 2 (two) times daily. For anxiety   Yes [provider]  cetirizine (ZYRTEC) 10 MG tablet Take 1 tablet (10 mg total) by mouth daily. 06/05/17  Yes Collene Gobble, MD  PROAIR HFA 108 [phone]  Base) MCG/ACT inhaler INHALE 2 PUFFS INTO THE LUNGS EVERY 6 HOURS AS NEEDED FOR WHEEZING 07/15/17  Yes Byrum, Rose Fillers, MD  sertraline (ZOLOFT) 100 MG tablet Take 150 mg by mouth daily.    Yes [provider]  TRELEGY ELLIPTA 100-62.5-25 MCG/INH AEPB INHALE ONE PUFF BY MOUTH INTO THE LUNGS ONCE DAILY 11/24/17  Yes Byrum, Rose Fillers, MD  oxyCODONE-acetaminophen (PERCOCET/ROXICET) 5-325 MG tablet Take 1-2 tablets by mouth every 4 (four) hours as needed for up to 7 days for moderate pain or severe pain. 02/05/18 02/12/18  Tawny Asal, MD    Family History Family History  Problem Relation Age of Onset  . Cancer Mother   . Aneurysm Father     Social History Social History   Tobacco Use  . Smoking status: Former Smoker    Packs/day: 1.00    Years: 50.00    Pack years: 50.00    Types: Cigarettes    Last attempt to quit: 12/19/1999    Years since quitting: 18.1  . Smokeless tobacco: Never Used  Substance Use Topics  . Alcohol use: No  . Drug use: No     Allergies   Codeine; Neurontin [gabapentin]; and Tramadol   Review of Systems Review of Systems  Constitutional: Negative for fatigue and fever.  Respiratory: Negative for shortness of breath.   Cardiovascular:  Negative for chest pain and palpitations.  Gastrointestinal: Negative for abdominal pain, nausea and vomiting.  Musculoskeletal: Negative for neck pain.       Hip pain  Neurological: Negative for syncope, weakness and light-headedness.     Physical Exam Updated Vital Signs BP (!) 121/53 (BP Location: Left Arm)   Pulse 77   Temp 97.7 F (36.5 C) (Oral)   Resp 18   Ht 5' (1.524 m)   Wt 76.7 kg (169 lb)   SpO2 96%   BMI 33.01 kg/m   General: Elderly woman, chronically ill appearing, no acute distress Head: Normocephalic, atraumatic Eyes: PERRL, EOMI, normal conjuctiva  ENT: Moist mucus membranes, no pharyngeal exudate  CV: RRR, s1, s2  Resp: Clear anterior breath sounds bilaterally, no wheezing, normal  work of breathing, no distress  Abd: Soft, +BS, non-tender to palpation  Extr: Tenderness to palpation to anterior inguinal region and posterior inferior buttock. No limb length discrepancy or obvious deformity, no ecchymosis underlying tender areas. Distal pulses of LEs intact  Neuro: Alert and oriented x3, sensation and strength to bilateral LE intact Skin: Warm, dry     ED Treatments / Results  Labs (all labs ordered are listed, but only abnormal results are displayed) Labs Reviewed - No data to display  EKG None  Radiology Ct Pelvis Wo Contrast  Result Date: 02/05/2018 CLINICAL DATA:  Status post fall in yard, with acute onset of left hip pain. Abnormal plain films. EXAM: CT PELVIS WITHOUT CONTRAST TECHNIQUE: Multidetector CT imaging of the pelvis was performed following the standard protocol without intravenous contrast. COMPARISON:  CT of the abdomen and pelvis performed 10/10/2016, and left hip radiographs performed earlier today at 5:31 a.m. FINDINGS: Urinary Tract: The bladder is moderately distended. Blood is seen tracking along the left side of the bladder and into the space of Retzius, reflecting the overlying fractures. Bowel: Visualized small and large bowel loops are grossly unremarkable, aside from mild diverticulosis along the descending and sigmoid colon. Vascular/Lymphatic: Scattered calcification is noted along the distal abdominal aorta and its branches. No pelvic sidewall or inguinal lymphadenopathy is seen. Reproductive: The uterus is grossly unremarkable. The ovaries are relatively symmetric. No suspicious adnexal masses are seen. Other: Mild intramuscular hemorrhage is seen along the left pelvic sidewall. Musculoskeletal: There is a minimally displaced fracture through the anterior column of the left acetabulum. There are also minimally displaced fracture lines extending across the left superior and inferior pubic rami. Degenerative change is noted at the pubic symphysis. No  additional fractures are seen. The sacroiliac joints are grossly unremarkable. Mild degenerative change is noted at the lower lumbar spine. The remaining musculature is grossly unremarkable in appearance. IMPRESSION: 1. Minimally displaced fracture through the anterior column of the left acetabulum. Minimally displaced fracture lines extending across the left superior and inferior pubic rami. 2. Blood tracking along the left side of the bladder and into the space of Retzius, reflecting the overlying fractures. 3. Mild intramuscular hemorrhage along the left pelvic sidewall. 4. Mild diverticulosis along the descending and sigmoid colon. Aortic Atherosclerosis (ICD10-I70.0). Electronically Signed   By: Garald Balding M.D.   On: 02/05/2018 07:14   Dg Hip Unilat With Pelvis 2-3 Views Left  Result Date: 02/05/2018 CLINICAL DATA:  Fall with left hip pain EXAM: DG HIP (WITH OR WITHOUT PELVIS) 2-3V LEFT COMPARISON:  None. FINDINGS: There is no evidence of hip fracture or dislocation. Slight irregularity of the contours of the inferior and superior rami of the left pubis, possibly  indicating minimally displaced fracture. IMPRESSION: Suspected minimally displaced fractures of the left superior and inferior pubic rami. Consider dedicated pelvic radiographs or pelvic CT for confirmation. Electronically Signed   By: Ulyses Jarred M.D.   On: 02/05/2018 06:09    Procedures Procedures (including critical care time)  Medications Ordered in ED Medications  oxyCODONE-acetaminophen (PERCOCET/ROXICET) 5-325 MG per tablet 1-2 tablet (1 tablet Oral Given 02/05/18 0736)     Initial Impression / Assessment and Plan / ED Course  I have reviewed the triage vital signs and the nursing notes.  Pertinent labs & imaging results that were available during my care of the patient were reviewed by me and considered in my medical decision making (see chart for details).  72 year old female presenting with hip pain following a  mechanical fall found to have minimally displaced pelvic rami fractures on L on CT imaging. She reports a history of osteoporosis and on teriparatide. She will likely need to be started on a bisphosphonate in several weeks following initial bone healing. Discussed case with Dr. Lyla Glassing of [place], recommend weight bearing as tolerated with a walker with follow up as an outpatient in two weeks, agree she is appropriate for discharge home with appropriate DME and assistive devices. Will provide pain control prescription, order placed for walker, and counseled pt regarding plan and safety. Pt expressed understanding and agreeable with plan   Final Clinical Impressions(s) / ED Diagnoses   Final diagnoses:  Closed nondisplaced fracture of pelvis, unspecified part of pelvis, initial encounter [facility])    ED Discharge Orders        Ordered    oxyCODONE-acetaminophen (PERCOCET/ROXICET) 5-325 MG tablet  Every 4 hours PRN    Note to Pharmacy:  Acute pain med, fracture   02/05/18 0850       Tawny Asal, MD 02/05/18 9528    Shanon Rosser, MD 02/11/18 2235

## 2018-02-05 NOTE — ED Triage Notes (Signed)
Patient trippred in yard while walking her dog

## 2018-02-19 ENCOUNTER — Inpatient Hospital Stay
Admission: RE | Admit: 2018-02-19 | Discharge: 2018-02-19 | Disposition: A | Discharge status: Home / Self Care | Payer: Medicare Other | Source: Ambulatory Visit | Attending: Family Medicine | Admitting: Family Medicine

## 2018-02-19 ENCOUNTER — Ambulatory Visit
Admission: RE | Admit: 2018-02-19 | Discharge: 2018-02-19 | Disposition: A | Discharge status: Home / Self Care | Payer: Medicare Other | Source: Ambulatory Visit | Attending: Family Medicine | Admitting: Family Medicine

## 2018-02-19 DIAGNOSIS — Z139 Encounter for screening, unspecified: Secondary | ICD-10-CM

## 2018-03-16 ENCOUNTER — Other Ambulatory Visit: Payer: Self-pay | Admitting: Adult Health

## 2018-03-26 ENCOUNTER — Other Ambulatory Visit: Payer: Self-pay | Admitting: Adult Health

## 2018-04-27 ENCOUNTER — Other Ambulatory Visit: Payer: Self-pay | Admitting: Emergency Medicine

## 2018-04-27 MED ORDER — MONTELUKAST SODIUM 10 MG PO TABS: 10.0000 mg | ORAL_TABLET | Freq: Every morning | ORAL | 0 refills | Status: DC

## 2018-04-27 MED ORDER — FLUTICASONE-UMECLIDIN-VILANT 100-62.5-25 MCG/INH IN AEPB: 1.0000 | INHALATION_SPRAY | Freq: Every day | RESPIRATORY_TRACT | 0 refills | Status: DC

## 2018-04-30 ENCOUNTER — Other Ambulatory Visit: Payer: Self-pay | Admitting: Emergency Medicine

## 2018-05-04 ENCOUNTER — Other Ambulatory Visit: Payer: Self-pay | Admitting: Emergency Medicine

## 2018-06-06 ENCOUNTER — Other Ambulatory Visit: Payer: Self-pay | Admitting: Emergency Medicine

## 2018-06-08 ENCOUNTER — Telehealth: Payer: Self-pay | Admitting: Emergency Medicine

## 2018-06-08 MED ORDER — FLUTICASONE-UMECLIDIN-VILANT 100-62.5-25 MCG/INH IN AEPB: 1.0000 | INHALATION_SPRAY | Freq: Every day | RESPIRATORY_TRACT | 0 refills | Status: DC

## 2018-06-08 NOTE — Telephone Encounter (Signed)
Called and spoke with patient, one month refill has been sent in. Appointment has been made. Nothing further needed.

## 2018-07-06 ENCOUNTER — Telehealth: Payer: Self-pay | Admitting: Emergency Medicine

## 2018-07-06 NOTE — Telephone Encounter (Signed)
Called and spoke to pt's daughter, susie (DPR). Pt is scheduled for OV on 07/08/18, however susie will not be able to make this appointment.  Susie states that pt was recently dx with dementia. Susie is concerned about pt's medications. Pt states she is doing albuterol neb once daily avg 3 days weekly, trelegy daily & albuterol HFA daily.  Susie states that Rx for neb solution is expired. If RB would like for pt to continue albuterol neb, pt will need need Rx. Susie is also concerned about pt's lung strength, as pt tried incentive spirometry yesterday and could not move the ball.  Routing to [employer] as an Pharmacist, hospital.

## 2018-07-08 ENCOUNTER — Ambulatory Visit (INDEPENDENT_AMBULATORY_CARE_PROVIDER_SITE_OTHER): Payer: Medicare Other | Admitting: Emergency Medicine

## 2018-07-08 ENCOUNTER — Telehealth: Payer: Self-pay | Admitting: Emergency Medicine

## 2018-07-08 ENCOUNTER — Encounter: Payer: Self-pay | Admitting: Emergency Medicine

## 2018-07-08 DIAGNOSIS — J9611 Chronic respiratory failure with hypoxia: Secondary | ICD-10-CM | POA: Diagnosis not present

## 2018-07-08 DIAGNOSIS — J301 Allergic rhinitis due to pollen: Secondary | ICD-10-CM

## 2018-07-08 DIAGNOSIS — J449 Chronic obstructive pulmonary disease, unspecified: Secondary | ICD-10-CM | POA: Diagnosis not present

## 2018-07-08 DIAGNOSIS — Z720 Tobacco use: Secondary | ICD-10-CM | POA: Diagnosis not present

## 2018-07-08 MED ORDER — ALBUTEROL SULFATE (2.5 MG/3ML) 0.083% IN NEBU: 2.5000 mg | INHALATION_SOLUTION | Freq: Four times a day (QID) | RESPIRATORY_TRACT | 3 refills | Status: DC | PRN

## 2018-07-08 NOTE — Telephone Encounter (Signed)
Called and spoke with patient, prescription sent in to verified pharmacy. Nothing further needed.

## 2018-07-08 NOTE — Assessment & Plan Note (Signed)
Still smoking a pack a day.  I explained to her that this is contributing to her progression in her daily symptoms.  One of the most important things that you can do to help your breathing and prevent the progression of COPD is to stop smoking.  Please start to work on cutting your cigarettes down

## 2018-07-08 NOTE — Assessment & Plan Note (Signed)
Walking oximetry today on room air to see if you qualify for submental oxygen. Please continue Trelegy 1 inhalation once daily.  We will check your technique with this medicine today.  Remember to rinse and gargle after using. Keep your albuterol available to use either 2 puffs or 1 nebulizer treatment if needed for shortness of breath, chest tightness, wheezing.  We will refill your nebulizer medication today Flu shot is up-to-date Pneumonia shot is up-to-date. Follow with Dr Lamonte Sakai in 6 months or sooner if you have any problems

## 2018-07-08 NOTE — Telephone Encounter (Signed)
I reviewed with the patient 11/7. Thanks.

## 2018-07-08 NOTE — Assessment & Plan Note (Signed)
Continue singulair 10mg  each evening Start taking your Flonase 2 sprays each nostril every day instead of just sometimes. You could consider starting back your Zyrtec 10 mg once daily if you continue to have nasal drainage.

## 2018-07-08 NOTE — Progress Notes (Signed)
Subjective:    Patient ID: Kathy Skinner, female    DOB: [birth year], 72 y.o.   MRN: [id number]  HPI 72 yo former smoker (75 pk-yrs), hx COPD dx in ~2004, also carries dx ? asthma although suspect mostly COPD. Anxiety and panic attacks.   Kathy Skinner 5/'14 at PCP:  FVC 2.0L 68 % FEV1 1.2L 49% Ratio 58%  ROV 07/08/18 --this is a follow-up visit for 72 year old woman with very severe COPD and associated chronic hypoxemic respiratory failure.  She also has a history of dementia with some confusion regarding her medications.  We have been managing her on Trelegy.  She has albuterol HFA available and uses it approximately 1-2x a day.  She is used albuterol nebulizers in the past but her prescription has expired.  I have a message from her daughter Kathy Skinner regarding some concerned that she has poor inspiratory strength and may have some difficulty delivering the Trelegy. Pt is having significant exertional SOB with most exertion. She has chest soreness that bothers her most of the time. She has has daily cough, productive of clear to yellow. She has chronic nasal gtt. Uses flonase sometimes, not every day. On singulair. She is smoking 1/2 pk a day. Flu shot up to date. PNA shots up to date. She has does not not have home O2 - she qualified before but then didn';t like it.   No flowsheet data found.  Review of Systems  Constitutional: Negative for fever and unexpected weight change.  HENT: Positive for congestion. Negative for dental problem, ear pain, nosebleeds, postnasal drip, rhinorrhea, sinus pressure, sneezing, sore throat and trouble swallowing.   Eyes: Negative for redness, itching and visual disturbance.  Respiratory: Positive for cough and shortness of breath. Negative for chest tightness and wheezing.   Cardiovascular: Negative for palpitations and leg swelling.  Gastrointestinal: Negative for nausea and vomiting.  Genitourinary: Negative for dysuria.  Musculoskeletal: Negative for joint swelling.   Skin: Negative for rash.  Neurological: Negative for dizziness and headaches.  Hematological: Does not bruise/bleed easily.  Psychiatric/Behavioral: Negative for dysphoric mood. The patient is not nervous/anxious.        Objective:   Physical Exam Vitals:   07/08/18 0903  BP: 132/80  Pulse: 77  SpO2: 92%  Weight: 147 lb (66.7 kg)  Height: 5\' 4"  (1.626 m)   Gen: Pleasant, overwt, in no distress,  normal affect  ENT: No lesions,  mouth clear,  oropharynx clear, no postnasal drip  Neck: No JVD, no TMG, no carotid bruits, mild insp and exp stridor  Lungs: No use of accessory muscles, B exp rhonchi and wheeze  Cardiovascular: RRR, heart sounds normal, no murmur or gallops, no peripheral edema  Musculoskeletal: No deformities, no cyanosis or clubbing  Neuro: alert, non focal  Skin: Warm, no lesions or rashes       Assessment & Plan:  Chronic respiratory failure (HCC) Walking oximetry today, lowest SPO2 89%, did not qualify for submental oxygen at this time  COPD GOLD D Walking oximetry today on room air to see if you qualify for submental oxygen. Please continue Trelegy 1 inhalation once daily.  We will check your technique with this medicine today.  Remember to rinse and gargle after using. Keep your albuterol available to use either 2 puffs or 1 nebulizer treatment if needed for shortness of breath, chest tightness, wheezing.  We will refill your nebulizer medication today Flu shot is up-to-date Pneumonia shot is up-to-date. Follow with Dr Lamonte Sakai in 6 months or sooner if  you have any problems  Allergic rhinitis Continue singulair 10mg  each evening Start taking your Flonase 2 sprays each nostril every day instead of just sometimes. You could consider starting back your Zyrtec 10 mg once daily if you continue to have nasal drainage.  Tobacco use Still smoking a pack a day.  I explained to her that this is contributing to her progression in her daily symptoms.  One  of the most important things that you can do to help your breathing and prevent the progression of COPD is to stop smoking.  Please start to work on cutting your cigarettes down  Baltazar Apo, MD, PhD 07/08/2018, 9:33 AM [facility] [phone] or if no answer [phone]

## 2018-07-08 NOTE — Patient Instructions (Addendum)
Walking oximetry today on room air to see if you qualify for submental oxygen. Please continue Trelegy 1 inhalation once daily.  We will check your technique with this medicine today.  Remember to rinse and gargle after using. Keep your albuterol available to use either 2 puffs or 1 nebulizer treatment if needed for shortness of breath, chest tightness, wheezing.  We will refill your nebulizer medication today Flu shot is up-to-date Pneumonia shot is up-to-date Continue singulair 10mg  each evening Start taking your Flonase 2 sprays each nostril every day instead of just sometimes. You could consider starting back your Zyrtec 10 mg once daily if you continue to have nasal drainage. One of the most important things that you can do to help your breathing and prevent the progression of COPD is to stop smoking.  Please start to work on cutting your cigarettes down. Follow with Dr Lamonte Sakai in 6 months or sooner if you have any problems

## 2018-07-08 NOTE — Assessment & Plan Note (Signed)
Walking oximetry today, lowest SPO2 89%, did not qualify for submental oxygen at this time

## 2018-08-22 ENCOUNTER — Other Ambulatory Visit: Payer: Self-pay | Admitting: Emergency Medicine

## 2018-10-14 ENCOUNTER — Other Ambulatory Visit: Payer: Self-pay | Admitting: Emergency Medicine

## 2019-01-17 ENCOUNTER — Other Ambulatory Visit: Payer: Self-pay | Admitting: Emergency Medicine

## 2019-02-04 DIAGNOSIS — F329 Major depressive disorder, single episode, unspecified: Secondary | ICD-10-CM | POA: Diagnosis present

## 2019-02-08 ENCOUNTER — Other Ambulatory Visit: Payer: Self-pay | Admitting: Family Medicine

## 2019-02-08 DIAGNOSIS — Z1231 Encounter for screening mammogram for malignant neoplasm of breast: Secondary | ICD-10-CM

## 2019-02-08 DIAGNOSIS — M8580 Other specified disorders of bone density and structure, unspecified site: Secondary | ICD-10-CM

## 2019-02-21 IMAGING — NM NM MISC PROCEDURE
3 series · 18 of 18 positions shown · non-contrast
Comparison: none

[Series 1: rest_(id)_sa · 6.4mm · 6.40mm/px · 6 of 64 frames shown]
[frame 6/64]
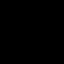
[frame 16/64]
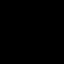
[frame 27/64]
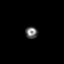
[frame 38/64]
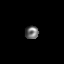
[frame 48/64]
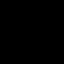
[frame 59/64]
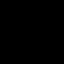

[Series 1: stress-gsp_(id)_sa · 6.4mm · 6.40mm/px · 6 of 512 frames shown]
[frame 43/512]
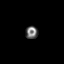
[frame 128/512]
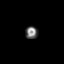
[frame 214/512]
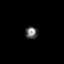
[frame 299/512]
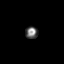
[frame 384/512]
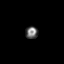
[frame 470/512]
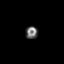

[Series 1: stress-sum-em_(id)_sa · 6.4mm · 6.40mm/px · 6 of 64 frames shown]
[frame 6/64]
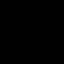
[frame 16/64]
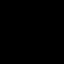
[frame 27/64]
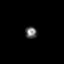
[frame 38/64]
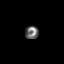
[frame 48/64]
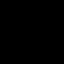
[frame 59/64]
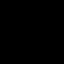

[18 of 18 positions shown; findings below may reference images not displayed]

Canned report from images found in remote index.

Refer to host system for actual result text.

## 2019-04-11 ENCOUNTER — Telehealth: Payer: Self-pay | Admitting: Emergency Medicine

## 2019-04-11 MED ORDER — ALBUTEROL SULFATE HFA 108 (90 BASE) MCG/ACT IN AERS: 2.0000 | INHALATION_SPRAY | Freq: Four times a day (QID) | RESPIRATORY_TRACT | 5 refills | Status: DC | PRN

## 2019-04-11 NOTE — Telephone Encounter (Signed)
Refill of pt's abuterol inhaler has been sent to pharmacy for pt. Attempted to call number listed for pt but line went straight to VM but unable to leave a VM due to mailbox being full. Attempted to call pt's daughter Daine Floras but unable to reach. Left detailed message on her phone letting her know that refill had been sent in. Nothing further needed.

## 2019-04-19 ENCOUNTER — Ambulatory Visit (INDEPENDENT_AMBULATORY_CARE_PROVIDER_SITE_OTHER): Payer: Medicare Other | Admitting: Emergency Medicine

## 2019-04-19 ENCOUNTER — Encounter: Payer: Self-pay | Admitting: Emergency Medicine

## 2019-04-19 ENCOUNTER — Other Ambulatory Visit: Payer: Self-pay

## 2019-04-19 DIAGNOSIS — J449 Chronic obstructive pulmonary disease, unspecified: Secondary | ICD-10-CM | POA: Diagnosis not present

## 2019-04-19 DIAGNOSIS — Z720 Tobacco use: Secondary | ICD-10-CM | POA: Diagnosis not present

## 2019-04-19 DIAGNOSIS — J301 Allergic rhinitis due to pollen: Secondary | ICD-10-CM | POA: Diagnosis not present

## 2019-04-19 MED ORDER — MONTELUKAST SODIUM 10 MG PO TABS: 10.0000 mg | ORAL_TABLET | Freq: Every morning | ORAL | 3 refills | Status: DC

## 2019-04-19 NOTE — Progress Notes (Signed)
Virtual Visit via Telephone Note  I connected with Kathy Skinner on 04/19/19 at  3:15 PM EDT by telephone and verified that I am speaking with the correct person using two identifiers.  Location: Patient: Home Provider: Office   I discussed the limitations, risks, security and privacy concerns of performing an evaluation and management service by telephone and the availability of in person appointments. I also discussed with the patient that there may be a patient responsible charge related to this service. The patient expressed understanding and agreed to proceed.  History of Present Illness: 73 year old woman, smoker, with very severe COPD with associated chronic hypoxemic respiratory failure.  She also has dementia, anxiety and some associated memory difficulties.  We have been managing her on Trelegy.  She uses albuterol approximately few times a week.  She is on Singulair for allergic rhinitis, has fluticasone nasal spray but hasn't started it.    Observations/Objective: She has been isolated. She complains of some increased congestion, also increased exertional dyspnea. Her indoor activities have decreased significantly. No flares reported.    Assessment and Plan: Tobacco use I talked her today about cessation.  She has cut down to 5 packs weekly, this because she depends on family to bring her cigarettes and that is all that they will bring.  COPD GOLD D Severe obstruction.  She is quite limited even at baseline but appears to be more so currently, likely due to some deconditioning from isolation.  She is slowing down, now in assisted living, in part due to her dementia as well.  We will plan to continue her Trelegy, albuterol as needed.  Allergic rhinitis Continue Singulair.  She has not been using fluticasone nasal spray but she is willing to retry it.  We discussed this today.    Follow Up Instructions: 4-6 months   I discussed the assessment and treatment plan with the  patient. The patient was provided an opportunity to ask questions and all were answered. The patient agreed with the plan and demonstrated an understanding of the instructions.   The patient was advised to call back or seek an in-person evaluation if the symptoms worsen or if the condition fails to improve as anticipated.  I provided 18 minutes of non-face-to-face time during this encounter.   Collene Gobble, MD

## 2019-04-19 NOTE — Assessment & Plan Note (Signed)
I talked her today about cessation.  She has cut down to 5 packs weekly, this because she depends on family to bring her cigarettes and that is all that they will bring.

## 2019-04-19 NOTE — Assessment & Plan Note (Signed)
Severe obstruction.  She is quite limited even at baseline but appears to be more so currently, likely due to some deconditioning from isolation.  She is slowing down, now in assisted living, in part due to her dementia as well.  We will plan to continue her Trelegy, albuterol as needed.

## 2019-04-19 NOTE — Assessment & Plan Note (Signed)
Continue Singulair.  She has not been using fluticasone nasal spray but she is willing to retry it.  We discussed this today.

## 2019-10-18 ENCOUNTER — Other Ambulatory Visit: Payer: Self-pay | Admitting: Emergency Medicine

## 2019-11-18 ENCOUNTER — Other Ambulatory Visit: Payer: Self-pay

## 2019-11-18 ENCOUNTER — Encounter: Payer: Self-pay | Admitting: Emergency Medicine

## 2019-11-18 ENCOUNTER — Ambulatory Visit (INDEPENDENT_AMBULATORY_CARE_PROVIDER_SITE_OTHER): Payer: Medicare Other | Admitting: Emergency Medicine

## 2019-11-18 DIAGNOSIS — J449 Chronic obstructive pulmonary disease, unspecified: Secondary | ICD-10-CM | POA: Diagnosis not present

## 2019-11-18 DIAGNOSIS — J9611 Chronic respiratory failure with hypoxia: Secondary | ICD-10-CM

## 2019-11-18 DIAGNOSIS — Z720 Tobacco use: Secondary | ICD-10-CM | POA: Diagnosis not present

## 2019-11-18 NOTE — Assessment & Plan Note (Signed)
Severe disease.  Overall her functional capacity is probably slightly worse than last visit, no exacerbations.  Contributors include unreliable compliance with her Trelegy due to forgetfulness.  They are going to work on this.  Also probable untreated exertional hypoxemia.  She is tolerating Trelegy and does believe she is benefited from it.  We will continue this, use albuterol as needed.  She is working on getting the [employer].  She may benefit from pulmonary rehab at some point going forward.  Of course discussed and stressed smoking cessation.  She is not ready to do this or set a quit date.

## 2019-11-18 NOTE — Progress Notes (Signed)
Subjective:    Patient ID: Kathy Skinner, female    DOB: 02-20-1946, 74 y.o.   MRN: [id number]  HPI 74 yo former smoker (54 pk-yrs), hx COPD dx in ~2004, also carries dx ? asthma although suspect mostly COPD. Anxiety and panic attacks.   [name] 5/'14 at PCP:  FVC 2.0L 68 % FEV1 1.2L 49% Ratio 58%  ROV 07/08/18 --this is a follow-up visit for 74 year old woman with very severe COPD and associated chronic hypoxemic respiratory failure.  She also has a history of dementia with some confusion regarding her medications.  We have been managing her on Trelegy.  She has albuterol HFA available and uses it approximately 1-2x a day.  She is used albuterol nebulizers in the past but her prescription has expired.  I have a message from her daughter Daine Floras regarding some concerned that she has poor inspiratory strength and may have some difficulty delivering the Trelegy. Pt is having significant exertional SOB with most exertion. She has chest soreness that bothers her most of the time. She has has daily cough, productive of clear to yellow. She has chronic nasal gtt. Uses flonase sometimes, not every day. On singulair. She is smoking 1/2 pk a day. Flu shot up to date. PNA shots up to date. She has does not not have home O2 - she qualified before but then didn';t like it.   ROV 11/18/19 --follow-up visit for Ms. Ferrebee, 74 year old woman woman with very severe COPD and associated chronic hypoxemic respiratory failure.  She also has a history of dementia with some confusion.  I last spoke to her 04/19/2019 for a video visit.  We have been managing her on Trelegy - she forgets it and misses it some days. Uses albuterol HFA rarely.  She has some irregular HR, gets SOB with walking over 100 ft. She uses Singulair for allergic rhinitis, fluticasone nasal spray.  She continues to smoke approximately 1 pk a day. She is borderline for qualifying for O2 - didn't use it when she had it. She has had one of her COVID vaccines, scheduled  for the 2nd.    No flowsheet data found.  Review of Systems  Constitutional: Negative for fever and unexpected weight change.  HENT: Positive for congestion. Negative for dental problem, ear pain, nosebleeds, postnasal drip, rhinorrhea, sinus pressure, sneezing, sore throat and trouble swallowing.   Eyes: Negative for redness, itching and visual disturbance.  Respiratory: Positive for cough and shortness of breath. Negative for chest tightness and wheezing.   Cardiovascular: Negative for palpitations and leg swelling.  Gastrointestinal: Negative for nausea and vomiting.  Genitourinary: Negative for dysuria.  Musculoskeletal: Negative for joint swelling.  Skin: Negative for rash.  Neurological: Negative for dizziness and headaches.  Hematological: Does not bruise/bleed easily.  Psychiatric/Behavioral: Negative for dysphoric mood. The patient is not nervous/anxious.        Objective:   Physical Exam Vitals:   11/18/19 1053  BP: 110/68  Pulse: 65  Temp: 97.9 F (36.6 C)  TempSrc: Temporal  SpO2: 95%  Weight: 158 lb 3.2 oz (71.8 kg)  Height: 5\' 2"  (1.575 m)   Gen: Pleasant, overwt, in no distress,  normal affect  ENT: No lesions,  mouth clear,  oropharynx clear, no postnasal drip  Neck: No JVD, no stridor  Lungs: No use of accessory muscles, no wheezing (improved)  Cardiovascular: RRR, heart sounds normal, no murmur or gallops, no peripheral edema  Musculoskeletal: No deformities, no cyanosis or clubbing  Neuro: alert, non focal  Skin:  Warm, no lesions or rashes       Assessment & Plan:  COPD GOLD D Severe disease.  Overall her functional capacity is probably slightly worse than last visit, no exacerbations.  Contributors include unreliable compliance with her Trelegy due to forgetfulness.  They are going to work on this.  Also probable untreated exertional hypoxemia.  She is tolerating Trelegy and does believe she is benefited from it.  We will continue this, use  albuterol as needed.  She is working on getting the [employer].  She may benefit from pulmonary rehab at some point going forward.  Of course discussed and stressed smoking cessation.  She is not ready to do this or set a quit date.  Tobacco use Discussed cessation briefly.  She is not highly motivated to stop, does not want to consider at this time.  Chronic respiratory failure (HCC) Probable exertional desats, she has done so in the past.  We have not arranged for oxygen due to her personal preference.  At this point the concern is that there would be risk associated with her continued smoking and her dementia.  Both she and her daughter acknowledged that the patient would be at risk for accidental using the oxygen near cigarettes.  For that reason we have deferred.  Baltazar Apo, MD, PhD 11/18/2019, 1:13 PM [facility] [phone] or if no answer [phone]

## 2019-11-18 NOTE — Patient Instructions (Signed)
Please continue Trelegy 1 inhalation once daily every day.  Rinse and gargle after using. Keep your albuterol available use 2 puffs if needed for shortness of breath, chest tightness, wheezing. We talked today about your smoking.  The best thing that you can do for your health would be to cut down ultimately stop. We talked today about possibly qualifying for exertional oxygen.  At this time I share your concern that starting oxygen would be risky given your continued tobacco use.  We can revisit going forward especially if you stop smoking. Get your second Covid vaccine as planned Follow with Dr Lamonte Sakai in 4 months or sooner if you have any problems.

## 2019-11-18 NOTE — Assessment & Plan Note (Signed)
Probable exertional desats, she has done so in the past.  We have not arranged for oxygen due to her personal preference.  At this point the concern is that there would be risk associated with her continued smoking and her dementia.  Both she and her daughter acknowledged that the patient would be at risk for accidental using the oxygen near cigarettes.  For that reason we have deferred.

## 2019-11-18 NOTE — Assessment & Plan Note (Signed)
Discussed cessation briefly.  She is not highly motivated to stop, does not want to consider at this time.

## 2020-02-20 ENCOUNTER — Other Ambulatory Visit: Payer: Self-pay | Admitting: Emergency Medicine

## 2020-02-21 ENCOUNTER — Telehealth: Payer: Self-pay | Admitting: Emergency Medicine

## 2020-02-21 MED ORDER — TRELEGY ELLIPTA 100-62.5-25 MCG/INH IN AEPB: INHALATION_SPRAY | RESPIRATORY_TRACT | 5 refills | Status: DC

## 2020-02-21 NOTE — Telephone Encounter (Signed)
[facility] called, refills for Trelegy sent as requested.

## 2020-02-22 ENCOUNTER — Telehealth: Payer: Self-pay | Admitting: Emergency Medicine

## 2020-02-22 NOTE — Telephone Encounter (Signed)
I see that the Rx for Trelegy was sent to pharmacy for pt on 6/22. Called pt's pharmacy and spoke with Altha Harm to see if Rx for Trelegy was ready for pt to pick up and she stated that it was ready for pick up.  Attempted to call pt's daughter Daine Floras to let her know that the Rx was ready to be picked up at pharmacy but unable to reach. Left message for her to return call.

## 2020-02-23 NOTE — Telephone Encounter (Signed)
Spoke with pt's daughter, Daine Floras. She is aware that the pt's prescription is ready to be picked up. Nothing further was needed.

## 2020-04-10 ENCOUNTER — Other Ambulatory Visit: Payer: Self-pay | Admitting: Emergency Medicine

## 2020-04-11 ENCOUNTER — Encounter: Payer: Self-pay | Admitting: Emergency Medicine

## 2020-04-11 ENCOUNTER — Ambulatory Visit (INDEPENDENT_AMBULATORY_CARE_PROVIDER_SITE_OTHER): Payer: Medicare Other

## 2020-04-11 ENCOUNTER — Ambulatory Visit (INDEPENDENT_AMBULATORY_CARE_PROVIDER_SITE_OTHER): Payer: Medicare Other | Admitting: Emergency Medicine

## 2020-04-11 ENCOUNTER — Other Ambulatory Visit: Payer: Self-pay

## 2020-04-11 VITALS — BP 134/78 | HR 85 | Temp 98.7°F | Ht 62.0 in | Wt 182.0 lb

## 2020-04-11 DIAGNOSIS — J449 Chronic obstructive pulmonary disease, unspecified: Secondary | ICD-10-CM

## 2020-04-11 DIAGNOSIS — Z720 Tobacco use: Secondary | ICD-10-CM | POA: Diagnosis not present

## 2020-04-11 NOTE — Assessment & Plan Note (Signed)
Overall stable, no flares. Better function since she has been more reliable with her trelegy (with help of family to remind her).   CXR today.  Please continue Trelegy 1 inhalation once daily.  Rinse and gargle after using. Keep your albuterol available use 2 puffs when you need it for shortness of breath, chest tightness, wheezing. Continue Singulair 10 mg once each evening Covid 19 vaccine is up-to-date Okay from a pulmonary perspective to use Celebrex for your back and joint pain.  You will have to follow with Dr. Orland Mustard because this can also affect kidney function and other systems. Follow with Dr Lamonte Sakai in 6 months or sooner if you have any problems

## 2020-04-11 NOTE — Assessment & Plan Note (Signed)
Continue to work on decreasing your cigarettes. °

## 2020-04-11 NOTE — Progress Notes (Signed)
  Subjective:    Patient ID: Kathy Skinner, female    DOB: 09-21-1945, 74 y.o.   MRN: [id number]  HPI 74 yo former smoker (22 pk-yrs), hx COPD dx in ~2004, also carries dx ? asthma although suspect mostly COPD. Anxiety and panic attacks.   [name] 5/'14 at PCP:  FVC 2.0L 68 % FEV1 1.2L 49% Ratio 58%  ROV 11/18/19 --follow-up visit for Kathy Skinner, 74 year old woman with very severe COPD and associated chronic hypoxemic respiratory failure.  She also has a history of dementia with some confusion.  I last spoke to her 04/19/2019 for a video visit.  We have been managing her on Trelegy - she forgets it and misses it some days. Uses albuterol HFA rarely.  She has some irregular HR, gets SOB with walking over 100 ft. She uses Singulair for allergic rhinitis, fluticasone nasal spray.  She continues to smoke approximately 1 pk a day. She is borderline for qualifying for O2 - didn't use it when she had it. She has had one of her COVID vaccines, scheduled for the 2nd.   ROV 04/11/20 --74 year old woman with very severe COPD and associated chronic hypoxemic respiratory failure although she has not wanted to be on oxygen for personal reasons including concerned that she still smokes.  We have been managing her on Trelegy.  She continues to smoke approximately 14 cig a day. Her sister has noticed an improvement in her walking, functional capacity. She has noticed a feeling that her heart is pounding. Also some some L back pain, fairly constant. She coughs all through the day, occasionally prod. She has a cognitive decline. Minimal albuterol use. Singulair needs to be refilled. Considering celebrex COVID-19 vaccine up-to-date   No flowsheet data found.  Review of Systems  Constitutional: Negative for fever and unexpected weight change.  HENT: Positive for congestion. Negative for dental problem, ear pain, nosebleeds, postnasal drip, rhinorrhea, sinus pressure, sneezing, sore throat and trouble swallowing.   Eyes:  Negative for redness, itching and visual disturbance.  Respiratory: Positive for cough and shortness of breath. Negative for chest tightness and wheezing.   Cardiovascular: Negative for palpitations and leg swelling.  Gastrointestinal: Negative for nausea and vomiting.  Genitourinary: Negative for dysuria.  Musculoskeletal: Negative for joint swelling.  Skin: Negative for rash.  Neurological: Negative for dizziness and headaches.  Hematological: Does not bruise/bleed easily.  Psychiatric/Behavioral: Negative for dysphoric mood. The patient is not nervous/anxious.        Objective:   Physical Exam Vitals:   04/11/20 0924  BP: 134/78  Pulse: 85  Temp: 98.7 F (37.1 C)  SpO2: 93%  Weight: 182 lb (82.6 kg)  Height: 5\' 2"  (1.575 m)   Gen: Pleasant, overwt, in no distress,  normal affect  ENT: No lesions,  mouth clear,  oropharynx clear, no postnasal drip  Neck: No JVD, no stridor  Lungs: No use of accessory muscles, no wheezing (improved)  Cardiovascular: RRR, heart sounds normal, no murmur or gallops, no peripheral edema  Musculoskeletal: No deformities, no cyanosis or clubbing  Neuro: alert, non focal  Skin: Warm, no lesions or rashes       Assessment & Plan:  No problem-specific Assessment & Plan notes found for this encounter.  Baltazar Apo, MD, PhD 04/11/2020, 9:27 AM [facility] [phone] or if no answer [phone]

## 2020-04-11 NOTE — Patient Instructions (Addendum)
CXR today.  Please continue Trelegy 1 inhalation once daily.  Rinse and gargle after using. Keep your albuterol available use 2 puffs when you need it for shortness of breath, chest tightness, wheezing. Continue Singulair 10 mg once each evening Covid 19 vaccine is up-to-date Continue to work on decreasing your cigarettes Okay from a pulmonary perspective to use Celebrex for your back and joint pain.  You will have to follow with Dr. Orland Mustard because this can also affect kidney function and other systems. Follow with Dr Lamonte Sakai in 6 months or sooner if you have any problems

## 2020-06-05 ENCOUNTER — Other Ambulatory Visit: Payer: Self-pay | Admitting: Family Medicine

## 2020-06-05 DIAGNOSIS — Z1231 Encounter for screening mammogram for malignant neoplasm of breast: Secondary | ICD-10-CM

## 2020-06-05 DIAGNOSIS — M810 Age-related osteoporosis without current pathological fracture: Secondary | ICD-10-CM

## 2020-06-06 ENCOUNTER — Ambulatory Visit
Admission: RE | Admit: 2020-06-06 | Discharge: 2020-06-06 | Disposition: A | Discharge status: Home / Self Care | Payer: Medicare Other | Source: Ambulatory Visit | Attending: Family Medicine | Admitting: Family Medicine

## 2020-06-06 ENCOUNTER — Other Ambulatory Visit: Payer: Self-pay

## 2020-06-06 DIAGNOSIS — Z1231 Encounter for screening mammogram for malignant neoplasm of breast: Secondary | ICD-10-CM

## 2020-06-06 DIAGNOSIS — M810 Age-related osteoporosis without current pathological fracture: Secondary | ICD-10-CM

## 2020-08-10 ENCOUNTER — Other Ambulatory Visit: Payer: Self-pay | Admitting: Emergency Medicine

## 2020-08-10 ENCOUNTER — Telehealth: Payer: Self-pay | Admitting: Emergency Medicine

## 2020-08-10 MED ORDER — TRELEGY ELLIPTA 100-62.5-25 MCG/INH IN AEPB: INHALATION_SPRAY | RESPIRATORY_TRACT | 5 refills | Status: DC

## 2020-08-10 NOTE — Telephone Encounter (Signed)
Called and spoke with Kathy Skinner, listed on DPR, clarified that patient is only receiving one inhaler and not 2 as ordered, this is why she needs a refill.  Advised it may be an insurance issue, Kathy Skinner to speak with pharmacy to find out.  Refills sent to pharmacy, nothing further needed.

## 2020-09-05 DIAGNOSIS — M810 Age-related osteoporosis without current pathological fracture: Secondary | ICD-10-CM | POA: Diagnosis not present

## 2020-09-05 DIAGNOSIS — E559 Vitamin D deficiency, unspecified: Secondary | ICD-10-CM | POA: Diagnosis not present

## 2020-09-10 MED ORDER — ALBUTEROL SULFATE HFA 108 (90 BASE) MCG/ACT IN AERS: 2.0000 | INHALATION_SPRAY | Freq: Four times a day (QID) | RESPIRATORY_TRACT | 5 refills | Status: DC | PRN

## 2020-09-14 DIAGNOSIS — L281 Prurigo nodularis: Secondary | ICD-10-CM | POA: Diagnosis not present

## 2020-10-30 DIAGNOSIS — M2550 Pain in unspecified joint: Secondary | ICD-10-CM | POA: Diagnosis not present

## 2020-10-30 DIAGNOSIS — M8580 Other specified disorders of bone density and structure, unspecified site: Secondary | ICD-10-CM | POA: Diagnosis not present

## 2020-10-30 DIAGNOSIS — J449 Chronic obstructive pulmonary disease, unspecified: Secondary | ICD-10-CM | POA: Diagnosis not present

## 2020-10-30 DIAGNOSIS — E559 Vitamin D deficiency, unspecified: Secondary | ICD-10-CM | POA: Diagnosis not present

## 2020-10-30 DIAGNOSIS — I10 Essential (primary) hypertension: Secondary | ICD-10-CM | POA: Diagnosis not present

## 2021-02-07 ENCOUNTER — Other Ambulatory Visit: Payer: Self-pay | Admitting: Emergency Medicine

## 2021-03-09 ENCOUNTER — Other Ambulatory Visit: Payer: Self-pay | Admitting: Emergency Medicine

## 2021-03-21 DIAGNOSIS — E559 Vitamin D deficiency, unspecified: Secondary | ICD-10-CM | POA: Diagnosis not present

## 2021-03-21 DIAGNOSIS — I10 Essential (primary) hypertension: Secondary | ICD-10-CM | POA: Diagnosis not present

## 2021-03-21 DIAGNOSIS — M2550 Pain in unspecified joint: Secondary | ICD-10-CM | POA: Diagnosis not present

## 2021-03-21 DIAGNOSIS — E785 Hyperlipidemia, unspecified: Secondary | ICD-10-CM | POA: Diagnosis not present

## 2021-03-21 DIAGNOSIS — Z720 Tobacco use: Secondary | ICD-10-CM | POA: Diagnosis not present

## 2021-03-21 DIAGNOSIS — Z0000 Encounter for general adult medical examination without abnormal findings: Secondary | ICD-10-CM | POA: Diagnosis not present

## 2021-03-21 DIAGNOSIS — J449 Chronic obstructive pulmonary disease, unspecified: Secondary | ICD-10-CM | POA: Diagnosis not present

## 2021-03-21 DIAGNOSIS — I700 Atherosclerosis of aorta: Secondary | ICD-10-CM | POA: Diagnosis not present

## 2021-03-21 DIAGNOSIS — M8580 Other specified disorders of bone density and structure, unspecified site: Secondary | ICD-10-CM | POA: Diagnosis not present

## 2021-04-08 ENCOUNTER — Other Ambulatory Visit: Payer: Self-pay | Admitting: Emergency Medicine

## 2021-05-25 DIAGNOSIS — J22 Unspecified acute lower respiratory infection: Secondary | ICD-10-CM | POA: Diagnosis not present

## 2021-05-30 DIAGNOSIS — E559 Vitamin D deficiency, unspecified: Secondary | ICD-10-CM | POA: Diagnosis not present

## 2021-05-30 DIAGNOSIS — Z23 Encounter for immunization: Secondary | ICD-10-CM | POA: Diagnosis not present

## 2021-05-30 DIAGNOSIS — M810 Age-related osteoporosis without current pathological fracture: Secondary | ICD-10-CM | POA: Diagnosis not present

## 2021-06-27 ENCOUNTER — Telehealth: Payer: Self-pay | Admitting: Emergency Medicine

## 2021-06-27 MED ORDER — TRELEGY ELLIPTA 100-62.5-25 MCG/ACT IN AEPB: 1.0000 | INHALATION_SPRAY | Freq: Every day | RESPIRATORY_TRACT | 0 refills | Status: DC

## 2021-06-27 NOTE — Telephone Encounter (Signed)
1 month supply of trelegy 100 has been sent to preferred pharmacy. Pending ov 07/07/2021.-past due rov.  Patient's daughter, Kathy Skinner(DPR) is aware and voiced her understanding.  Nothing further needed.

## 2021-07-03 DIAGNOSIS — M810 Age-related osteoporosis without current pathological fracture: Secondary | ICD-10-CM | POA: Diagnosis not present

## 2021-07-10 ENCOUNTER — Encounter: Payer: Self-pay | Admitting: Emergency Medicine

## 2021-07-10 ENCOUNTER — Ambulatory Visit (INDEPENDENT_AMBULATORY_CARE_PROVIDER_SITE_OTHER): Payer: Medicare Other | Admitting: Emergency Medicine

## 2021-07-10 ENCOUNTER — Other Ambulatory Visit: Payer: Self-pay

## 2021-07-10 DIAGNOSIS — J449 Chronic obstructive pulmonary disease, unspecified: Secondary | ICD-10-CM

## 2021-07-10 DIAGNOSIS — J9611 Chronic respiratory failure with hypoxia: Secondary | ICD-10-CM

## 2021-07-10 DIAGNOSIS — Z720 Tobacco use: Secondary | ICD-10-CM

## 2021-07-10 MED ORDER — ALBUTEROL SULFATE HFA 108 (90 BASE) MCG/ACT IN AERS: 2.0000 | INHALATION_SPRAY | Freq: Four times a day (QID) | RESPIRATORY_TRACT | 3 refills | Status: AC | PRN

## 2021-07-10 MED ORDER — TRELEGY ELLIPTA 100-62.5-25 MCG/ACT IN AEPB: 1.0000 | INHALATION_SPRAY | Freq: Every day | RESPIRATORY_TRACT | 11 refills | Status: DC

## 2021-07-10 NOTE — Addendum Note (Signed)
Addended by: Elby Beck R on: 07/10/2021 05:26 PM   Modules accepted: Orders

## 2021-07-10 NOTE — Progress Notes (Signed)
Subjective:    Patient ID: Kathy Skinner, female    DOB: [birth year], 75 y.o.   MRN: [id number]  HPI 75 yo former smoker (42 pk-yrs), hx COPD dx in ~2004, also carries dx ? asthma although suspect mostly COPD. Anxiety and panic attacks.   [name] 5/'14 at PCP:  FVC 2.0L 68 % FEV1 1.2L 49% Ratio 58%  ROV 11/18/19 --follow-up visit for Kathy Skinner, 75 year old woman with very severe COPD and associated chronic hypoxemic respiratory failure.  She also has a history of dementia with some confusion.  I last spoke to her 04/19/2019 for a video visit.  We have been managing her on Trelegy - she forgets it and misses it some days. Uses albuterol HFA rarely.  She has some irregular HR, gets SOB with walking over 100 ft. She uses Singulair for allergic rhinitis, fluticasone nasal spray.  She continues to smoke approximately 1 pk a day. She is borderline for qualifying for O2 - didn't use it when she had it. She has had one of her COVID vaccines, scheduled for the 2nd.   ROV 04/11/20 --75 year old woman with very severe COPD and associated chronic hypoxemic respiratory failure although she has not wanted to be on oxygen for personal reasons including concerned that she still smokes.  We have been managing her on Trelegy.  She continues to smoke approximately 14 cig a day. Her sister has noticed an improvement in her walking, functional capacity. She has noticed a feeling that her heart is pounding. Also some some L back pain, fairly constant. She coughs all through the day, occasionally prod. She has a cognitive decline. Minimal albuterol use. Singulair needs to be refilled. Considering celebrex COVID-19 vaccine up-to-date  ROV 07/10/21 --follow-up visit 75 year old woman with history of tobacco use, very severe COPD.  She has associated chronic hypoxemic respiratory failure but has not wanted to be started on supplemental oxygen.  She has been managed on Trelegy.  She uses albuterol very rarely.  Singulair once  daily, Prilosec 20 mg once daily Reports that her breathing has been stable with her usual sedentary activity. She will get sob with longer walks, when going out. Can walk about 200 ft but then needs to stop to rest. Uses an electric cart to go farther. Daily cough, minimally productive. Treated for an AE a few months ago in setting URI. That was the only flare since last time.    No flowsheet data found.   Review of Systems  Constitutional:  Negative for fever and unexpected weight change.  HENT:  Positive for congestion. Negative for dental problem, ear pain, nosebleeds, postnasal drip, rhinorrhea, sinus pressure, sneezing, sore throat and trouble swallowing.   Eyes:  Negative for redness, itching and visual disturbance.  Respiratory:  Positive for cough and shortness of breath. Negative for chest tightness and wheezing.   Cardiovascular:  Negative for palpitations and leg swelling.  Gastrointestinal:  Negative for nausea and vomiting.  Genitourinary:  Negative for dysuria.  Musculoskeletal:  Negative for joint swelling.  Skin:  Negative for rash.  Neurological:  Negative for dizziness and headaches.  Hematological:  Does not bruise/bleed easily.  Psychiatric/Behavioral:  Negative for dysphoric mood. The patient is not nervous/anxious.       Objective:   Physical Exam Vitals:   07/10/21 1703  BP: 140/80  Pulse: 77  Temp: 98.2 F (36.8 C)  TempSrc: Oral  SpO2: 94%  Weight: 185 lb 9.6 oz (84.2 kg)  Height: 5\' 2"  (1.575 m)    Gen:  Pleasant, overwt, in no distress,  normal affect  ENT: No lesions,  mouth clear,  oropharynx clear, no postnasal drip  Neck: No JVD, no stridor  Lungs: No use of accessory muscles, end exp wheeze, rhonchi on a forced exp.   Cardiovascular: RRR, heart sounds normal, no murmur or gallops, no peripheral edema  Musculoskeletal: No deformities, no cyanosis or clubbing  Neuro: alert, non focal  Skin: Warm, no lesions or rashes        Assessment & Plan:  COPD GOLD D Please continue your Trelegy 1 inhalation once daily.  Rinse and gargle after using. Keep albuterol available to use 2 puffs if needed for shortness of breath, chest tightness, wheezing.  You could consider taking 2 puffs about 10 minutes prior to exertion to see if this is helpful. Flu shot up-to-date COVID-19 vaccine is up-to-date.  Consider getting the new COVID-19 booster shot this fall. Follow with Dr. Lamonte Sakai in 12 months or sooner if you have any problems.  Tobacco use You would benefit from decreasing your cigarettes.  Chronic respiratory failure (HCC) We have deferred starting supplemental oxygen with exertion.  Concern for risk associated with her continued smoking.  Baltazar Apo, MD, PhD 07/10/2021, 5:19 PM [facility] [phone] or if no answer [phone]

## 2021-07-10 NOTE — Assessment & Plan Note (Signed)
Please continue your Trelegy 1 inhalation once daily.  Rinse and gargle after using. Keep albuterol available to use 2 puffs if needed for shortness of breath, chest tightness, wheezing.  You could consider taking 2 puffs about 10 minutes prior to exertion to see if this is helpful. Flu shot up-to-date COVID-19 vaccine is up-to-date.  Consider getting the new COVID-19 booster shot this fall. Follow with Dr. Lamonte Sakai in 12 months or sooner if you have any problems.

## 2021-07-10 NOTE — Assessment & Plan Note (Signed)
We have deferred starting supplemental oxygen with exertion.  Concern for risk associated with her continued smoking.

## 2021-07-10 NOTE — Assessment & Plan Note (Signed)
You would benefit from decreasing your cigarettes.

## 2021-07-10 NOTE — Patient Instructions (Signed)
Please continue your Trelegy 1 inhalation once daily.  Rinse and gargle after using. Keep albuterol available to use 2 puffs if needed for shortness of breath, chest tightness, wheezing.  You could consider taking 2 puffs about 10 minutes prior to exertion to see if this is helpful. You would benefit from decreasing your cigarettes. Flu shot up-to-date COVID-19 vaccine is up-to-date.  Consider getting the new COVID-19 booster shot this fall. Follow with Dr. Lamonte Sakai in 12 months or sooner if you have any problems.

## 2021-07-17 DIAGNOSIS — E559 Vitamin D deficiency, unspecified: Secondary | ICD-10-CM | POA: Diagnosis not present

## 2021-07-17 DIAGNOSIS — I10 Essential (primary) hypertension: Secondary | ICD-10-CM | POA: Diagnosis not present

## 2021-07-17 DIAGNOSIS — J449 Chronic obstructive pulmonary disease, unspecified: Secondary | ICD-10-CM | POA: Diagnosis not present

## 2021-07-17 DIAGNOSIS — M8580 Other specified disorders of bone density and structure, unspecified site: Secondary | ICD-10-CM | POA: Diagnosis not present

## 2021-08-15 DIAGNOSIS — S329XXA Fracture of unspecified parts of lumbosacral spine and pelvis, initial encounter for closed fracture: Secondary | ICD-10-CM | POA: Diagnosis not present

## 2021-08-15 DIAGNOSIS — I10 Essential (primary) hypertension: Secondary | ICD-10-CM | POA: Diagnosis not present

## 2021-08-15 DIAGNOSIS — J449 Chronic obstructive pulmonary disease, unspecified: Secondary | ICD-10-CM | POA: Diagnosis not present

## 2021-08-15 DIAGNOSIS — M810 Age-related osteoporosis without current pathological fracture: Secondary | ICD-10-CM | POA: Diagnosis not present

## 2021-08-15 DIAGNOSIS — E785 Hyperlipidemia, unspecified: Secondary | ICD-10-CM | POA: Diagnosis not present

## 2021-11-28 DIAGNOSIS — E785 Hyperlipidemia, unspecified: Secondary | ICD-10-CM | POA: Diagnosis not present

## 2021-11-28 DIAGNOSIS — I10 Essential (primary) hypertension: Secondary | ICD-10-CM | POA: Diagnosis not present

## 2021-11-28 DIAGNOSIS — M810 Age-related osteoporosis without current pathological fracture: Secondary | ICD-10-CM | POA: Diagnosis not present

## 2021-12-20 DIAGNOSIS — I10 Essential (primary) hypertension: Secondary | ICD-10-CM | POA: Diagnosis not present

## 2021-12-20 DIAGNOSIS — E559 Vitamin D deficiency, unspecified: Secondary | ICD-10-CM | POA: Diagnosis not present

## 2021-12-20 DIAGNOSIS — J449 Chronic obstructive pulmonary disease, unspecified: Secondary | ICD-10-CM | POA: Diagnosis not present

## 2021-12-20 DIAGNOSIS — M8580 Other specified disorders of bone density and structure, unspecified site: Secondary | ICD-10-CM | POA: Diagnosis not present

## 2021-12-20 DIAGNOSIS — I700 Atherosclerosis of aorta: Secondary | ICD-10-CM | POA: Diagnosis not present

## 2022-01-02 DIAGNOSIS — M810 Age-related osteoporosis without current pathological fracture: Secondary | ICD-10-CM | POA: Diagnosis not present

## 2022-01-10 DIAGNOSIS — E785 Hyperlipidemia, unspecified: Secondary | ICD-10-CM | POA: Diagnosis not present

## 2022-01-10 DIAGNOSIS — J449 Chronic obstructive pulmonary disease, unspecified: Secondary | ICD-10-CM | POA: Diagnosis not present

## 2022-01-10 DIAGNOSIS — I10 Essential (primary) hypertension: Secondary | ICD-10-CM | POA: Diagnosis not present

## 2022-01-10 DIAGNOSIS — M810 Age-related osteoporosis without current pathological fracture: Secondary | ICD-10-CM | POA: Diagnosis not present

## 2022-03-10 ENCOUNTER — Other Ambulatory Visit: Payer: Self-pay | Admitting: Emergency Medicine

## 2022-03-28 DIAGNOSIS — I700 Atherosclerosis of aorta: Secondary | ICD-10-CM | POA: Diagnosis not present

## 2022-03-28 DIAGNOSIS — I10 Essential (primary) hypertension: Secondary | ICD-10-CM | POA: Diagnosis not present

## 2022-03-28 DIAGNOSIS — Z720 Tobacco use: Secondary | ICD-10-CM | POA: Diagnosis not present

## 2022-03-28 DIAGNOSIS — E785 Hyperlipidemia, unspecified: Secondary | ICD-10-CM | POA: Diagnosis not present

## 2022-03-28 DIAGNOSIS — Z0000 Encounter for general adult medical examination without abnormal findings: Secondary | ICD-10-CM | POA: Diagnosis not present

## 2022-03-28 DIAGNOSIS — M8580 Other specified disorders of bone density and structure, unspecified site: Secondary | ICD-10-CM | POA: Diagnosis not present

## 2022-03-28 DIAGNOSIS — Z1231 Encounter for screening mammogram for malignant neoplasm of breast: Secondary | ICD-10-CM | POA: Diagnosis not present

## 2022-03-28 DIAGNOSIS — L309 Dermatitis, unspecified: Secondary | ICD-10-CM | POA: Diagnosis not present

## 2022-03-28 DIAGNOSIS — J449 Chronic obstructive pulmonary disease, unspecified: Secondary | ICD-10-CM | POA: Diagnosis not present

## 2022-03-28 DIAGNOSIS — M2550 Pain in unspecified joint: Secondary | ICD-10-CM | POA: Diagnosis not present

## 2022-03-28 DIAGNOSIS — E559 Vitamin D deficiency, unspecified: Secondary | ICD-10-CM | POA: Diagnosis not present

## 2022-05-01 DIAGNOSIS — M810 Age-related osteoporosis without current pathological fracture: Secondary | ICD-10-CM | POA: Diagnosis not present

## 2022-05-01 DIAGNOSIS — Z1231 Encounter for screening mammogram for malignant neoplasm of breast: Secondary | ICD-10-CM | POA: Diagnosis not present

## 2022-06-23 DIAGNOSIS — E785 Hyperlipidemia, unspecified: Secondary | ICD-10-CM | POA: Diagnosis not present

## 2022-06-23 DIAGNOSIS — I10 Essential (primary) hypertension: Secondary | ICD-10-CM | POA: Diagnosis not present

## 2022-06-23 DIAGNOSIS — J449 Chronic obstructive pulmonary disease, unspecified: Secondary | ICD-10-CM | POA: Diagnosis not present

## 2022-06-23 DIAGNOSIS — M810 Age-related osteoporosis without current pathological fracture: Secondary | ICD-10-CM | POA: Diagnosis not present

## 2022-07-06 ENCOUNTER — Other Ambulatory Visit: Payer: Self-pay | Admitting: Emergency Medicine

## 2022-07-16 DIAGNOSIS — M810 Age-related osteoporosis without current pathological fracture: Secondary | ICD-10-CM | POA: Diagnosis not present

## 2022-07-16 DIAGNOSIS — E559 Vitamin D deficiency, unspecified: Secondary | ICD-10-CM | POA: Diagnosis not present

## 2022-07-22 DIAGNOSIS — M810 Age-related osteoporosis without current pathological fracture: Secondary | ICD-10-CM | POA: Diagnosis not present

## 2022-07-22 DIAGNOSIS — E559 Vitamin D deficiency, unspecified: Secondary | ICD-10-CM | POA: Diagnosis not present

## 2022-07-22 DIAGNOSIS — Z23 Encounter for immunization: Secondary | ICD-10-CM | POA: Diagnosis not present

## 2022-08-05 DIAGNOSIS — M810 Age-related osteoporosis without current pathological fracture: Secondary | ICD-10-CM | POA: Diagnosis not present

## 2022-09-12 DIAGNOSIS — E559 Vitamin D deficiency, unspecified: Secondary | ICD-10-CM | POA: Diagnosis not present

## 2022-09-12 DIAGNOSIS — I10 Essential (primary) hypertension: Secondary | ICD-10-CM | POA: Diagnosis not present

## 2022-09-12 DIAGNOSIS — J449 Chronic obstructive pulmonary disease, unspecified: Secondary | ICD-10-CM | POA: Diagnosis not present

## 2022-09-12 DIAGNOSIS — E785 Hyperlipidemia, unspecified: Secondary | ICD-10-CM | POA: Diagnosis not present

## 2022-09-12 DIAGNOSIS — Z720 Tobacco use: Secondary | ICD-10-CM | POA: Diagnosis not present

## 2022-09-24 DIAGNOSIS — Z0279 Encounter for issue of other medical certificate: Secondary | ICD-10-CM | POA: Diagnosis not present

## 2022-09-29 DIAGNOSIS — E509 Vitamin A deficiency, unspecified: Secondary | ICD-10-CM | POA: Diagnosis not present

## 2022-09-29 DIAGNOSIS — E785 Hyperlipidemia, unspecified: Secondary | ICD-10-CM | POA: Diagnosis not present

## 2022-09-29 DIAGNOSIS — J449 Chronic obstructive pulmonary disease, unspecified: Secondary | ICD-10-CM | POA: Diagnosis not present

## 2022-09-29 DIAGNOSIS — R051 Acute cough: Secondary | ICD-10-CM | POA: Diagnosis not present

## 2022-10-06 DIAGNOSIS — R051 Acute cough: Secondary | ICD-10-CM | POA: Diagnosis not present

## 2022-10-06 DIAGNOSIS — F03911 Unspecified dementia, unspecified severity, with agitation: Secondary | ICD-10-CM | POA: Diagnosis not present

## 2022-10-06 DIAGNOSIS — J449 Chronic obstructive pulmonary disease, unspecified: Secondary | ICD-10-CM | POA: Diagnosis not present

## 2022-10-07 DIAGNOSIS — E785 Hyperlipidemia, unspecified: Secondary | ICD-10-CM | POA: Diagnosis not present

## 2022-10-07 DIAGNOSIS — K219 Gastro-esophageal reflux disease without esophagitis: Secondary | ICD-10-CM | POA: Diagnosis not present

## 2022-10-07 DIAGNOSIS — F0394 Unspecified dementia, unspecified severity, with anxiety: Secondary | ICD-10-CM | POA: Diagnosis not present

## 2022-10-07 DIAGNOSIS — J449 Chronic obstructive pulmonary disease, unspecified: Secondary | ICD-10-CM | POA: Diagnosis not present

## 2022-10-07 DIAGNOSIS — J309 Allergic rhinitis, unspecified: Secondary | ICD-10-CM | POA: Diagnosis not present

## 2022-10-07 DIAGNOSIS — Z791 Long term (current) use of non-steroidal anti-inflammatories (NSAID): Secondary | ICD-10-CM | POA: Diagnosis not present

## 2022-10-07 DIAGNOSIS — Z9181 History of falling: Secondary | ICD-10-CM | POA: Diagnosis not present

## 2022-10-07 DIAGNOSIS — G4710 Hypersomnia, unspecified: Secondary | ICD-10-CM | POA: Diagnosis not present

## 2022-10-07 DIAGNOSIS — Z720 Tobacco use: Secondary | ICD-10-CM | POA: Diagnosis not present

## 2022-10-07 DIAGNOSIS — J9610 Chronic respiratory failure, unspecified whether with hypoxia or hypercapnia: Secondary | ICD-10-CM | POA: Diagnosis not present

## 2022-10-08 DIAGNOSIS — Z79899 Other long term (current) drug therapy: Secondary | ICD-10-CM | POA: Diagnosis not present

## 2022-10-08 DIAGNOSIS — E785 Hyperlipidemia, unspecified: Secondary | ICD-10-CM | POA: Diagnosis not present

## 2022-10-15 DIAGNOSIS — E785 Hyperlipidemia, unspecified: Secondary | ICD-10-CM | POA: Diagnosis not present

## 2022-10-15 DIAGNOSIS — Z791 Long term (current) use of non-steroidal anti-inflammatories (NSAID): Secondary | ICD-10-CM | POA: Diagnosis not present

## 2022-10-15 DIAGNOSIS — Z9181 History of falling: Secondary | ICD-10-CM | POA: Diagnosis not present

## 2022-10-15 DIAGNOSIS — G4710 Hypersomnia, unspecified: Secondary | ICD-10-CM | POA: Diagnosis not present

## 2022-10-15 DIAGNOSIS — Z720 Tobacco use: Secondary | ICD-10-CM | POA: Diagnosis not present

## 2022-10-15 DIAGNOSIS — J309 Allergic rhinitis, unspecified: Secondary | ICD-10-CM | POA: Diagnosis not present

## 2022-10-15 DIAGNOSIS — J449 Chronic obstructive pulmonary disease, unspecified: Secondary | ICD-10-CM | POA: Diagnosis not present

## 2022-10-15 DIAGNOSIS — K219 Gastro-esophageal reflux disease without esophagitis: Secondary | ICD-10-CM | POA: Diagnosis not present

## 2022-10-15 DIAGNOSIS — J9610 Chronic respiratory failure, unspecified whether with hypoxia or hypercapnia: Secondary | ICD-10-CM | POA: Diagnosis not present

## 2022-10-15 DIAGNOSIS — F0394 Unspecified dementia, unspecified severity, with anxiety: Secondary | ICD-10-CM | POA: Diagnosis not present

## 2022-10-17 DIAGNOSIS — Z791 Long term (current) use of non-steroidal anti-inflammatories (NSAID): Secondary | ICD-10-CM | POA: Diagnosis not present

## 2022-10-17 DIAGNOSIS — J9610 Chronic respiratory failure, unspecified whether with hypoxia or hypercapnia: Secondary | ICD-10-CM | POA: Diagnosis not present

## 2022-10-17 DIAGNOSIS — G4710 Hypersomnia, unspecified: Secondary | ICD-10-CM | POA: Diagnosis not present

## 2022-10-17 DIAGNOSIS — E785 Hyperlipidemia, unspecified: Secondary | ICD-10-CM | POA: Diagnosis not present

## 2022-10-17 DIAGNOSIS — J309 Allergic rhinitis, unspecified: Secondary | ICD-10-CM | POA: Diagnosis not present

## 2022-10-17 DIAGNOSIS — J449 Chronic obstructive pulmonary disease, unspecified: Secondary | ICD-10-CM | POA: Diagnosis not present

## 2022-10-17 DIAGNOSIS — Z9181 History of falling: Secondary | ICD-10-CM | POA: Diagnosis not present

## 2022-10-17 DIAGNOSIS — Z720 Tobacco use: Secondary | ICD-10-CM | POA: Diagnosis not present

## 2022-10-17 DIAGNOSIS — F0394 Unspecified dementia, unspecified severity, with anxiety: Secondary | ICD-10-CM | POA: Diagnosis not present

## 2022-10-17 DIAGNOSIS — K219 Gastro-esophageal reflux disease without esophagitis: Secondary | ICD-10-CM | POA: Diagnosis not present

## 2022-10-20 DIAGNOSIS — E785 Hyperlipidemia, unspecified: Secondary | ICD-10-CM | POA: Diagnosis not present

## 2022-10-20 DIAGNOSIS — F03911 Unspecified dementia, unspecified severity, with agitation: Secondary | ICD-10-CM | POA: Diagnosis not present

## 2022-10-24 DIAGNOSIS — E785 Hyperlipidemia, unspecified: Secondary | ICD-10-CM | POA: Diagnosis not present

## 2022-10-24 DIAGNOSIS — J9610 Chronic respiratory failure, unspecified whether with hypoxia or hypercapnia: Secondary | ICD-10-CM | POA: Diagnosis not present

## 2022-10-24 DIAGNOSIS — F0394 Unspecified dementia, unspecified severity, with anxiety: Secondary | ICD-10-CM | POA: Diagnosis not present

## 2022-10-24 DIAGNOSIS — K219 Gastro-esophageal reflux disease without esophagitis: Secondary | ICD-10-CM | POA: Diagnosis not present

## 2022-10-24 DIAGNOSIS — Z9181 History of falling: Secondary | ICD-10-CM | POA: Diagnosis not present

## 2022-10-24 DIAGNOSIS — J309 Allergic rhinitis, unspecified: Secondary | ICD-10-CM | POA: Diagnosis not present

## 2022-10-24 DIAGNOSIS — J449 Chronic obstructive pulmonary disease, unspecified: Secondary | ICD-10-CM | POA: Diagnosis not present

## 2022-10-24 DIAGNOSIS — Z720 Tobacco use: Secondary | ICD-10-CM | POA: Diagnosis not present

## 2022-10-24 DIAGNOSIS — G4710 Hypersomnia, unspecified: Secondary | ICD-10-CM | POA: Diagnosis not present

## 2022-10-24 DIAGNOSIS — Z791 Long term (current) use of non-steroidal anti-inflammatories (NSAID): Secondary | ICD-10-CM | POA: Diagnosis not present

## 2022-10-28 ENCOUNTER — Other Ambulatory Visit: Payer: Self-pay | Admitting: Emergency Medicine

## 2022-10-28 DIAGNOSIS — J9610 Chronic respiratory failure, unspecified whether with hypoxia or hypercapnia: Secondary | ICD-10-CM | POA: Diagnosis not present

## 2022-10-28 DIAGNOSIS — K219 Gastro-esophageal reflux disease without esophagitis: Secondary | ICD-10-CM | POA: Diagnosis not present

## 2022-10-28 DIAGNOSIS — J309 Allergic rhinitis, unspecified: Secondary | ICD-10-CM | POA: Diagnosis not present

## 2022-10-28 DIAGNOSIS — E785 Hyperlipidemia, unspecified: Secondary | ICD-10-CM | POA: Diagnosis not present

## 2022-10-28 DIAGNOSIS — G4710 Hypersomnia, unspecified: Secondary | ICD-10-CM | POA: Diagnosis not present

## 2022-10-28 DIAGNOSIS — J449 Chronic obstructive pulmonary disease, unspecified: Secondary | ICD-10-CM | POA: Diagnosis not present

## 2022-10-28 DIAGNOSIS — Z791 Long term (current) use of non-steroidal anti-inflammatories (NSAID): Secondary | ICD-10-CM | POA: Diagnosis not present

## 2022-10-28 DIAGNOSIS — Z9181 History of falling: Secondary | ICD-10-CM | POA: Diagnosis not present

## 2022-10-28 DIAGNOSIS — Z720 Tobacco use: Secondary | ICD-10-CM | POA: Diagnosis not present

## 2022-10-28 DIAGNOSIS — F0394 Unspecified dementia, unspecified severity, with anxiety: Secondary | ICD-10-CM | POA: Diagnosis not present

## 2022-10-30 DIAGNOSIS — H04123 Dry eye syndrome of bilateral lacrimal glands: Secondary | ICD-10-CM | POA: Diagnosis not present

## 2022-11-05 DIAGNOSIS — G4710 Hypersomnia, unspecified: Secondary | ICD-10-CM | POA: Diagnosis not present

## 2022-11-05 DIAGNOSIS — Z720 Tobacco use: Secondary | ICD-10-CM | POA: Diagnosis not present

## 2022-11-05 DIAGNOSIS — J9610 Chronic respiratory failure, unspecified whether with hypoxia or hypercapnia: Secondary | ICD-10-CM | POA: Diagnosis not present

## 2022-11-05 DIAGNOSIS — Z791 Long term (current) use of non-steroidal anti-inflammatories (NSAID): Secondary | ICD-10-CM | POA: Diagnosis not present

## 2022-11-05 DIAGNOSIS — F0394 Unspecified dementia, unspecified severity, with anxiety: Secondary | ICD-10-CM | POA: Diagnosis not present

## 2022-11-05 DIAGNOSIS — J309 Allergic rhinitis, unspecified: Secondary | ICD-10-CM | POA: Diagnosis not present

## 2022-11-05 DIAGNOSIS — Z9181 History of falling: Secondary | ICD-10-CM | POA: Diagnosis not present

## 2022-11-05 DIAGNOSIS — E785 Hyperlipidemia, unspecified: Secondary | ICD-10-CM | POA: Diagnosis not present

## 2022-11-05 DIAGNOSIS — J449 Chronic obstructive pulmonary disease, unspecified: Secondary | ICD-10-CM | POA: Diagnosis not present

## 2022-11-05 DIAGNOSIS — K219 Gastro-esophageal reflux disease without esophagitis: Secondary | ICD-10-CM | POA: Diagnosis not present

## 2022-11-16 ENCOUNTER — Other Ambulatory Visit: Payer: Self-pay | Admitting: Emergency Medicine

## 2022-11-17 DIAGNOSIS — F03911 Unspecified dementia, unspecified severity, with agitation: Secondary | ICD-10-CM | POA: Diagnosis not present

## 2022-11-17 DIAGNOSIS — J449 Chronic obstructive pulmonary disease, unspecified: Secondary | ICD-10-CM | POA: Diagnosis not present

## 2022-11-25 ENCOUNTER — Other Ambulatory Visit: Payer: Self-pay

## 2022-11-25 ENCOUNTER — Emergency Department (HOSPITAL_COMMUNITY): Payer: 59

## 2022-11-25 ENCOUNTER — Encounter (HOSPITAL_COMMUNITY): Payer: Self-pay | Admitting: Radiology

## 2022-11-25 ENCOUNTER — Observation Stay (HOSPITAL_COMMUNITY)
Admission: EM | Admit: 2022-11-25 | Discharge: 2022-11-27 | Disposition: A | Discharge status: Skilled Nursing Facility | Payer: 59 | Attending: Internal Medicine | Admitting: Internal Medicine

## 2022-11-25 DIAGNOSIS — S069XAA Unspecified intracranial injury with loss of consciousness status unknown, initial encounter: Secondary | ICD-10-CM | POA: Diagnosis present

## 2022-11-25 DIAGNOSIS — S32402S Unspecified fracture of left acetabulum, sequela: Secondary | ICD-10-CM

## 2022-11-25 DIAGNOSIS — F17210 Nicotine dependence, cigarettes, uncomplicated: Secondary | ICD-10-CM | POA: Diagnosis not present

## 2022-11-25 DIAGNOSIS — Z743 Need for continuous supervision: Secondary | ICD-10-CM | POA: Diagnosis not present

## 2022-11-25 DIAGNOSIS — J9611 Chronic respiratory failure with hypoxia: Secondary | ICD-10-CM | POA: Diagnosis not present

## 2022-11-25 DIAGNOSIS — W19XXXA Unspecified fall, initial encounter: Secondary | ICD-10-CM

## 2022-11-25 DIAGNOSIS — Z79899 Other long term (current) drug therapy: Secondary | ICD-10-CM | POA: Insufficient documentation

## 2022-11-25 DIAGNOSIS — R531 Weakness: Secondary | ICD-10-CM | POA: Diagnosis not present

## 2022-11-25 DIAGNOSIS — J449 Chronic obstructive pulmonary disease, unspecified: Secondary | ICD-10-CM | POA: Diagnosis present

## 2022-11-25 DIAGNOSIS — F0390 Unspecified dementia without behavioral disturbance: Secondary | ICD-10-CM | POA: Diagnosis present

## 2022-11-25 DIAGNOSIS — F0781 Postconcussional syndrome: Secondary | ICD-10-CM

## 2022-11-25 DIAGNOSIS — S199XXA Unspecified injury of neck, initial encounter: Secondary | ICD-10-CM | POA: Diagnosis not present

## 2022-11-25 DIAGNOSIS — S069X0A Unspecified intracranial injury without loss of consciousness, initial encounter: Secondary | ICD-10-CM

## 2022-11-25 DIAGNOSIS — R42 Dizziness and giddiness: Secondary | ICD-10-CM | POA: Diagnosis not present

## 2022-11-25 DIAGNOSIS — J45909 Unspecified asthma, uncomplicated: Secondary | ICD-10-CM | POA: Diagnosis not present

## 2022-11-25 DIAGNOSIS — I10 Essential (primary) hypertension: Secondary | ICD-10-CM | POA: Diagnosis not present

## 2022-11-25 DIAGNOSIS — I700 Atherosclerosis of aorta: Secondary | ICD-10-CM | POA: Diagnosis not present

## 2022-11-25 DIAGNOSIS — J9610 Chronic respiratory failure, unspecified whether with hypoxia or hypercapnia: Secondary | ICD-10-CM | POA: Diagnosis present

## 2022-11-25 DIAGNOSIS — S0003XA Contusion of scalp, initial encounter: Secondary | ICD-10-CM | POA: Diagnosis not present

## 2022-11-25 DIAGNOSIS — M25521 Pain in right elbow: Secondary | ICD-10-CM | POA: Diagnosis present

## 2022-11-25 DIAGNOSIS — E669 Obesity, unspecified: Secondary | ICD-10-CM | POA: Diagnosis present

## 2022-11-25 DIAGNOSIS — Z043 Encounter for examination and observation following other accident: Secondary | ICD-10-CM | POA: Diagnosis not present

## 2022-11-25 LAB — CBC WITH DIFFERENTIAL/PLATELET
Abs Immature Granulocytes: 0.04 10*3/uL (ref 0.00–0.07)
Basophils Absolute: 0.1 10*3/uL (ref 0.0–0.1)
Basophils Relative: 1 %
Eosinophils Absolute: 0.2 10*3/uL (ref 0.0–0.5)
Eosinophils Relative: 2 %
HCT: 40 % (ref 36.0–46.0)
Hemoglobin: 13.1 g/dL (ref 12.0–15.0)
Immature Granulocytes: 1 %
Lymphocytes Relative: 18 %
Lymphs Abs: 1.3 10*3/uL (ref 0.7–4.0)
MCH: 32.4 pg (ref 26.0–34.0)
MCHC: 32.8 g/dL (ref 30.0–36.0)
MCV: 99 fL (ref 80.0–100.0)
Monocytes Absolute: 0.4 10*3/uL (ref 0.1–1.0)
Monocytes Relative: 6 %
Neutro Abs: 5.3 10*3/uL (ref 1.7–7.7)
Neutrophils Relative %: 72 %
Platelets: 161 10*3/uL (ref 150–400)
RBC: 4.04 MIL/uL (ref 3.87–5.11)
RDW: 12.3 % (ref 11.5–15.5)
WBC: 7.3 10*3/uL (ref 4.0–10.5)
nRBC: 0 % (ref 0.0–0.2)

## 2022-11-25 LAB — BASIC METABOLIC PANEL
Anion gap: 7 (ref 5–15)
BUN: 7 mg/dL — ABNORMAL LOW (ref 8–23)
CO2: 26 mmol/L (ref 22–32)
Calcium: 7.7 mg/dL — ABNORMAL LOW (ref 8.9–10.3)
Chloride: 108 mmol/L (ref 98–111)
Creatinine, Ser: 0.62 mg/dL (ref 0.44–1.00)
GFR, Estimated: >60 mL/min (ref >60)
Glucose, Bld: 94 mg/dL (ref 70–99)
Potassium: 3.4 mmol/L — ABNORMAL LOW (ref 3.5–5.1)
Sodium: 141 mmol/L (ref 135–145)

## 2022-11-25 LAB — CBG MONITORING, ED: Glucose-Capillary: 87 mg/dL (ref 70–99)

## 2022-11-25 MED ORDER — UMECLIDINIUM BROMIDE 62.5 MCG/ACT IN AEPB
1.0000 | INHALATION_SPRAY | Freq: Every day | RESPIRATORY_TRACT | Status: DC
  Administered 2022-11-26 – 2022-11-27 (×2): 1 via RESPIRATORY_TRACT
  Filled 2022-11-25: qty 7

## 2022-11-25 MED ORDER — LORAZEPAM 2 MG/ML IJ SOLN
1.0000 mg | Freq: Once | INTRAMUSCULAR | Status: AC
  Administered 2022-11-25: 1 mg via INTRAVENOUS
  Filled 2022-11-25: qty 1

## 2022-11-25 MED ORDER — ACETAMINOPHEN 325 MG PO TABS: 650.0000 mg | ORAL_TABLET | Freq: Four times a day (QID) | ORAL | Status: DC | PRN

## 2022-11-25 MED ORDER — BUSPIRONE HCL 10 MG PO TABS
5.0000 mg | ORAL_TABLET | Freq: Three times a day (TID) | ORAL | Status: DC
  Administered 2022-11-25 – 2022-11-27 (×5): 5 mg via ORAL
  Filled 2022-11-25 (×5): qty 1

## 2022-11-25 MED ORDER — ACETAMINOPHEN 325 MG PO TABS
650.0000 mg | ORAL_TABLET | Freq: Once | ORAL | Status: DC
  Filled 2022-11-25: qty 2

## 2022-11-25 MED ORDER — DONEPEZIL HCL 10 MG PO TABS
10.0000 mg | ORAL_TABLET | Freq: Every day | ORAL | Status: DC
  Administered 2022-11-26 (×2): 10 mg via ORAL
  Filled 2022-11-25 (×2): qty 1

## 2022-11-25 MED ORDER — ACETAMINOPHEN 650 MG RE SUPP: 650.0000 mg | Freq: Four times a day (QID) | RECTAL | Status: DC | PRN

## 2022-11-25 MED ORDER — MECLIZINE HCL 25 MG PO TABS
25.0000 mg | ORAL_TABLET | Freq: Once | ORAL | Status: AC
  Administered 2022-11-25: 25 mg via ORAL
  Filled 2022-11-25: qty 1

## 2022-11-25 MED ORDER — ALBUTEROL SULFATE (2.5 MG/3ML) 0.083% IN NEBU: 2.5000 mg | INHALATION_SOLUTION | Freq: Four times a day (QID) | RESPIRATORY_TRACT | Status: DC | PRN

## 2022-11-25 MED ORDER — PANTOPRAZOLE SODIUM 40 MG PO TBEC
40.0000 mg | DELAYED_RELEASE_TABLET | Freq: Every day | ORAL | Status: DC
  Administered 2022-11-26 – 2022-11-27 (×2): 40 mg via ORAL
  Filled 2022-11-25 (×2): qty 1

## 2022-11-25 MED ORDER — MEMANTINE HCL 10 MG PO TABS
10.0000 mg | ORAL_TABLET | Freq: Two times a day (BID) | ORAL | Status: DC
  Administered 2022-11-26 – 2022-11-27 (×4): 10 mg via ORAL
  Filled 2022-11-25 (×4): qty 1

## 2022-11-25 MED ORDER — POTASSIUM CHLORIDE CRYS ER 20 MEQ PO TBCR
20.0000 meq | EXTENDED_RELEASE_TABLET | Freq: Once | ORAL | Status: AC
  Administered 2022-11-25: 20 meq via ORAL
  Filled 2022-11-25: qty 1

## 2022-11-25 MED ORDER — SODIUM CHLORIDE 0.9 % IV BOLUS
1000.0000 mL | Freq: Once | INTRAVENOUS | Status: AC
  Administered 2022-11-25: 1000 mL via INTRAVENOUS

## 2022-11-25 MED ORDER — FLUTICASONE FUROATE-VILANTEROL 100-25 MCG/ACT IN AEPB
1.0000 | INHALATION_SPRAY | Freq: Every day | RESPIRATORY_TRACT | Status: DC
  Administered 2022-11-26 – 2022-11-27 (×2): 1 via RESPIRATORY_TRACT
  Filled 2022-11-25: qty 28

## 2022-11-25 MED ORDER — OXYCODONE HCL 5 MG PO TABS
2.5000 mg | ORAL_TABLET | Freq: Four times a day (QID) | ORAL | Status: DC | PRN
  Administered 2022-11-25: 2.5 mg via ORAL
  Filled 2022-11-25: qty 1

## 2022-11-25 MED ORDER — ONDANSETRON HCL 4 MG PO TABS: 4.0000 mg | ORAL_TABLET | Freq: Four times a day (QID) | ORAL | Status: DC | PRN

## 2022-11-25 MED ORDER — PRAVASTATIN SODIUM 10 MG PO TABS
10.0000 mg | ORAL_TABLET | Freq: Every day | ORAL | Status: DC
  Administered 2022-11-26 – 2022-11-27 (×2): 10 mg via ORAL
  Filled 2022-11-25 (×2): qty 1

## 2022-11-25 MED ORDER — ONDANSETRON HCL 4 MG/2ML IJ SOLN: 4.0000 mg | Freq: Four times a day (QID) | INTRAMUSCULAR | Status: DC | PRN

## 2022-11-25 MED ORDER — SODIUM CHLORIDE 0.9% FLUSH
3.0000 mL | Freq: Two times a day (BID) | INTRAVENOUS | Status: DC
  Administered 2022-11-26 – 2022-11-27 (×3): 3 mL via INTRAVENOUS

## 2022-11-25 MED ORDER — POLYETHYLENE GLYCOL 3350 17 G PO PACK: 17.0000 g | PACK | Freq: Every day | ORAL | Status: DC | PRN

## 2022-11-25 MED ORDER — TRAZODONE HCL 100 MG PO TABS
100.0000 mg | ORAL_TABLET | Freq: Every day | ORAL | Status: DC
  Administered 2022-11-25: 100 mg via ORAL
  Filled 2022-11-25: qty 2

## 2022-11-25 MED ORDER — MONTELUKAST SODIUM 10 MG PO TABS
10.0000 mg | ORAL_TABLET | Freq: Every day | ORAL | Status: DC
  Administered 2022-11-25 – 2022-11-26 (×2): 10 mg via ORAL
  Filled 2022-11-25 (×2): qty 1

## 2022-11-25 NOTE — H&P (Signed)
History and Physical    Ch[place]i Petska [id number] DOB: March 19, 1946 DOA: 11/25/2022  PCP: Juanell Fairly, MD (Inactive)   Patient coming from: [facility]    Chief Complaint: [place] and hit head, right elbow pain   HPI: Kathy Skinner is a pleasant 77 y.o. female with medical history significant for dementia, anxiety, asthma, COPD, and chronic respiratory failure who presents to the emergency department after falling and hitting her head.  Patient was reportedly getting out of bed when she fell and hit her head.  She did not lose consciousness.  She has been complaining of headache and right elbow pain since the fall.  She also reports dizziness when trying to get up.  She denies chest pain, shortness of breath, fever, chills, or focal numbness or weakness.  ED Course: Upon arrival to the ED, patient is found to be afebrile and saturating mid 90s on room air with normal heart rate and stable blood pressure.  EKG demonstrates sinus rhythm.  No acute findings noted on CT head or CT cervical spine.  Plain radiographs of the right elbow are negative for fracture or effusion.  No acute pelvic fracture noted on x-ray.  Chest x-ray demonstrates borderline enlargement of cardiopericardial silhouette and likely remote left sixth rib fracture.  Potassium noted to be 3.4.  Patient was treated in the ED with 1 L of normal saline, acetaminophen, meclizine, and Ativan.  Review of Systems:  All other systems reviewed and apart from HPI, are negative.  Past Medical History:  Diagnosis Date   Anxiety    Asthma    COPD (chronic obstructive pulmonary disease) ([place])    Panic attacks     Past Surgical History:  Procedure Laterality Date   TUBAL LIGATION      Social History:   reports that she has been smoking cigarettes. She has a 50.00 pack-year smoking history. She has never used smokeless tobacco. She reports that she does not drink alcohol and does not use drugs.  Allergies  Allergen  Reactions   Codeine Nausea Only   Neurontin [Gabapentin]     seizures   Tramadol Swelling    Family History  Problem Relation Age of Onset   Cancer Mother    Aneurysm Father      Prior to Admission medications   Medication Sig Start Date End Date Taking? Authorizing Provider  albuterol (PROAIR HFA) 108 (90 Base) MCG/ACT inhaler Inhale 2 puffs into the lungs every 6 (six) hours as needed for wheezing or shortness of breath. 07/10/21   Collene Gobble, MD  albuterol (PROVENTIL) (2.5 MG/3ML) 0.083% nebulizer solution Take 3 mLs (2.5 mg total) by nebulization every 6 (six) hours as needed for wheezing or shortness of breath. 07/08/18   Collene Gobble, MD  busPIRone (BUSPAR) 5 MG tablet Take 5 mg by mouth 3 (three) times daily.    [provider]  denosumab (PROLIA) 60 MG/ML SOSY injection Inject 60 mg into the skin every 6 (six) months. 05/30/21   [provider]  donepezil (ARICEPT) 10 MG tablet Take 10 mg by mouth at bedtime.    [provider]  memantine (NAMENDA) 10 MG tablet Take 10 mg by mouth 2 (two) times daily. 06/07/21   [provider]  montelukast (SINGULAIR) 10 MG tablet TAKE ONE TABLET BY MOUTH EVERY MORNING 10/28/22   Collene Gobble, MD  omeprazole (PRILOSEC) 20 MG capsule Take 20 mg by mouth daily.    [provider]  pravastatin (PRAVACHOL) 10  MG tablet Take 10 mg by mouth daily.    [provider]  traZODone (DESYREL) 100 MG tablet Take 100 mg by mouth at bedtime.    [provider]  Donnal Debar 100-62.5-25 MCG/ACT AEPB INHALE ONE PUFF BY MOUTH ONE TIME DAILY 11/17/22   Collene Gobble, MD    Physical Exam: Vitals:   11/25/22 1934 11/25/22 1938 11/25/22 2030 11/25/22 2323  BP:  (!) 153/80 (!) 140/79 125/68  Pulse:  (!) 57 (!) 55 69  Resp:  16 14 17   Temp:  97.6 F (36.4 C)  97.7 F (36.5 C)  TempSrc:  Oral  Oral  SpO2:  95% 93% 93%  Weight: 83.9 kg     Height: 5\' 2"  (1.575 m)       Constitutional:  NAD, calm  Eyes: PERTLA, lids and conjunctivae normal ENMT: Mucous membranes are moist. Posterior pharynx clear of any exudate or lesions.   Neck: supple, no masses  Respiratory: no wheezing, no crackles. No accessory muscle use.  Cardiovascular: S1 & S2 heard, regular rate and rhythm. No JVD. Abdomen: No distension, no tenderness, soft. Bowel sounds active.  Musculoskeletal: no clubbing / cyanosis. No joint deformity upper and lower extremities.   Skin: no significant rashes, lesions, ulcers. Warm, dry, well-perfused. Neurologic: CN 2-12 grossly intact. Sensation intact. Strength 5/5 in all 4 limbs. Alert and oriented to person, place, and situation.  Psychiatric: Pleasant. Cooperative.    Labs and Imaging on Admission: I have personally reviewed following labs and imaging studies  CBC: Recent Labs  Lab 11/25/22 2142  WBC 7.3  NEUTROABS 5.3  HGB 13.1  HCT 40.0  MCV 99.0  PLT [id number]   Basic Metabolic Panel: Recent Labs  Lab 11/25/22 2142  NA 141  K 3.4*  CL 108  CO2 26  GLUCOSE 94  BUN 7*  CREATININE 0.62  CALCIUM 7.7*   GFR: Estimated Creatinine Clearance: 61.9 mL/min (by C-G formula based on SCr of 0.62 mg/dL). Liver Function Tests: No results for input(s): "AST", "ALT", "ALKPHOS", "BILITOT", "PROT", "ALBUMIN" in the last 168 hours. No results for input(s): "LIPASE", "AMYLASE" in the last 168 hours. No results for input(s): "AMMONIA" in the last 168 hours. Coagulation Profile: No results for input(s): "INR", "PROTIME" in the last 168 hours. Cardiac Enzymes: No results for input(s): "CKTOTAL", "CKMB", "CKMBINDEX", "TROPONINI" in the last 168 hours. BNP (last 3 results) No results for input(s): "PROBNP" in the last 8760 hours. HbA1C: No results for input(s): "HGBA1C" in the last 72 hours. CBG: Recent Labs  Lab 11/25/22 2025  GLUCAP 87   Lipid Profile: No results for input(s): "CHOL", "HDL", "LDLCALC", "TRIG", "CHOLHDL", "LDLDIRECT" in the last 72  hours. Thyroid Function Tests: No results for input(s): "TSH", "T4TOTAL", "FREET4", "T3FREE", "THYROIDAB" in the last 72 hours. Anemia Panel: No results for input(s): "VITAMINB12", "FOLATE", "FERRITIN", "TIBC", "IRON", "RETICCTPCT" in the last 72 hours. Urine analysis:    Component Value Date/Time   COLORURINE YELLOW 01/15/2013 2009   APPEARANCEUR TURBID (A) 01/15/2013 2009   LABSPEC 1.015 01/15/2013 2009   PHURINE 5.5 01/15/2013 2009   GLUCOSEU NEGATIVE 01/15/2013 2009   HGBUR NEGATIVE 01/15/2013 2009   BILIRUBINUR NEGATIVE 01/15/2013 2009   KETONESUR NEGATIVE 01/15/2013 2009   PROTEINUR NEGATIVE 01/15/2013 2009   UROBILINOGEN 0.2 01/15/2013 2009   NITRITE NEGATIVE 01/15/2013 2009   LEUKOCYTESUR SMALL (A) 01/15/2013 2009   Sepsis Labs: @LABRCNTIP (procalcitonin:4,lacticidven:4) )No results found for this or any previous visit (from the past 240 hour(s)).   Radiological  Exams on Admission: CT HEAD WO CONTRAST (5MM)  Result Date: 11/25/2022 CLINICAL DATA:  Head trauma EXAM: CT HEAD WITHOUT CONTRAST CT CERVICAL SPINE WITHOUT CONTRAST TECHNIQUE: Multidetector CT imaging of the head and cervical spine was performed following the standard protocol without intravenous contrast. Multiplanar CT image reconstructions of the cervical spine were also generated. RADIATION DOSE REDUCTION: This exam was performed according to the departmental dose-optimization program which includes automated exposure control, adjustment of the mA and/or kV according to patient size and/or use of iterative reconstruction technique. COMPARISON:  CT brain and cervical spine 03/04/2011 FINDINGS: CT HEAD FINDINGS Brain: mild white matter hypodensity consistent with chronic small vessel ischemic change. Nonenlarged ventricles. No acute territorial infarction, hemorrhage or intracranial mass. Vascular: No hyperdense vessels.  Carotid vascular calcification. Skull: Normal. Negative for fracture or focal lesion. Sinuses/Orbits:  No acute finding. Other: Moderate posterior scalp hematoma CT CERVICAL SPINE FINDINGS Alignment: No subluxation.  Facet alignment within normal limits. Skull base and vertebrae: No acute fracture. No primary bone lesion or focal pathologic process. Soft tissues and spinal canal: No prevertebral fluid or swelling. No visible canal hematoma. Disc levels: Multilevel degenerative changes. Moderate severe disc space narrowing C4-C5 and C5-C6. Facet degenerative changes at multiple levels with foraminal narrowing. Upper chest: Negative. Other: None IMPRESSION: No CT evidence for acute intracranial abnormality. Mild chronic small vessel ischemic changes of the white matter. Degenerative changes of the cervical spine. No acute osseous abnormality. Electronically Signed   By: Donavan Foil M.D.   On: 11/25/2022 20:26   CT Cervical Spine Wo Contrast  Result Date: 11/25/2022 CLINICAL DATA:  Head trauma EXAM: CT HEAD WITHOUT CONTRAST CT CERVICAL SPINE WITHOUT CONTRAST TECHNIQUE: Multidetector CT imaging of the head and cervical spine was performed following the standard protocol without intravenous contrast. Multiplanar CT image reconstructions of the cervical spine were also generated. RADIATION DOSE REDUCTION: This exam was performed according to the departmental dose-optimization program which includes automated exposure control, adjustment of the mA and/or kV according to patient size and/or use of iterative reconstruction technique. COMPARISON:  CT brain and cervical spine 03/04/2011 FINDINGS: CT HEAD FINDINGS Brain: mild white matter hypodensity consistent with chronic small vessel ischemic change. Nonenlarged ventricles. No acute territorial infarction, hemorrhage or intracranial mass. Vascular: No hyperdense vessels.  Carotid vascular calcification. Skull: Normal. Negative for fracture or focal lesion. Sinuses/Orbits: No acute finding. Other: Moderate posterior scalp hematoma CT CERVICAL SPINE FINDINGS Alignment: No  subluxation.  Facet alignment within normal limits. Skull base and vertebrae: No acute fracture. No primary bone lesion or focal pathologic process. Soft tissues and spinal canal: No prevertebral fluid or swelling. No visible canal hematoma. Disc levels: Multilevel degenerative changes. Moderate severe disc space narrowing C4-C5 and C5-C6. Facet degenerative changes at multiple levels with foraminal narrowing. Upper chest: Negative. Other: None IMPRESSION: No CT evidence for acute intracranial abnormality. Mild chronic small vessel ischemic changes of the white matter. Degenerative changes of the cervical spine. No acute osseous abnormality. Electronically Signed   By: Donavan Foil M.D.   On: 11/25/2022 20:26   DG Pelvis 1-2 Views  Result Date: 11/25/2022 CLINICAL DATA:  Fall exiting bed EXAM: PELVIS - 1-2 VIEW COMPARISON:  CT pelvis 02/05/2018 FINDINGS: Deformity from prior healed left anterior acetabular column fracture observed. Taking these deformities into account, no acute fracture is identified. The left hip is internally rotated compared to the right. Aortoiliac atherosclerotic vascular calcifications noted. Lower lumbar spondylosis. IMPRESSION: 1. No acute fracture is identified. 2. Deformity from prior healed  left anterior acetabular column fracture. I do not see a definite superimposed acute fracture, but if the patient is unable to bear weight then CT of the bony pelvis may be warranted to exclude the unlikely possibility of acute on chronic injury. 3.  Aortic Atherosclerosis (ICD10-I70.0). 4. Lower lumbar spondylosis. Electronically Signed   By: Van Clines M.D.   On: 11/25/2022 20:08   DG Elbow Complete Right  Result Date: 11/25/2022 CLINICAL DATA:  Fall from bed EXAM: RIGHT ELBOW - COMPLETE 3+ VIEW COMPARISON:  None Available. FINDINGS: No elbow joint effusion or bulging of the supinator fat pad. Mild spurring of the radial head and coronoid process. Small ossicle along the common  extensor tendon, well corticated, likely a chronically fragmented osteophyte. No fracture observed. IMPRESSION: 1. No fracture or elbow joint effusion identified. 2. Mild spurring of the radial head and coronoid process. Electronically Signed   By: Van Clines M.D.   On: 11/25/2022 20:05   DG Chest 1 View  Result Date: 11/25/2022 CLINICAL DATA:  Fall exiting bed EXAM: CHEST  1 VIEW COMPARISON:  04/11/2020 FINDINGS: The patient is rotated to the left on today's radiograph, reducing diagnostic sensitivity and specificity. Atherosclerotic calcification of the aortic arch. Borderline enlargement of the cardiopericardial silhouette, although difficult to accurately assess on this rotated projection. The lungs appear clear. No obvious blunting of the costophrenic angles. Likely remote left anterior sixth rib deformity/fracture. IMPRESSION: 1. Borderline enlargement of the cardiopericardial silhouette, without edema. 2. Likely remote left anterior sixth rib deformity/fracture. 3.  Aortic Atherosclerosis (ICD10-I70.0). Electronically Signed   By: Van Clines M.D.   On: 11/25/2022 20:03    EKG: Independently reviewed. Sinus rhythm.   Assessment/Plan   1. Mild TBI  - There was no LOC, no skull fx or acute intracranial findings on CT, and GCS is 15  - Continues to have headache and dizziness despite treatment in ED  - Neuro checks, supportive care, consider MRI brain if not improving as expected   2. COPD; chronic hypoxic respiratory failure  - Not in exacerbation on admission  - Not on home O2 d/t concern for risk with ongoing smoking  - Continue ICS-LAMA-LABA and as-needed albuterol   3. Dementia, anxiety, insomnia  - Continue Namenda, Aricept, Buspar, and trazodone, use delirium precautions     DVT prophylaxis: SCDs  Code Status: Full  Level of Care: Level of care: Telemetry Medical Family Communication: none present  Disposition Plan:  Patient is from: [place]   Anticipated d/c is to: [facility] Anticipated d/c date is: 11/26/22  Patient currently: Pending neurologic stability, may need PT eval if still unable to get up from bed tomorrow  Consults called: none  Admission status: Observation     Vianne Bulls, MD [facility]  11/25/2022, 11:42 PM

## 2022-11-25 NOTE — Discharge Instructions (Addendum)
You have a scalp hematoma on your CT scan.  Your CT scan did not show any other injuries  Your x-ray did not show any fractures.  You had an old acetabular fracture from previous fall  Take Tylenol or Motrin for pain  See your doctor for follow-up  Return to ER if you have worse hip pain or headache or vomiting or another fall

## 2022-11-25 NOTE — ED Triage Notes (Signed)
Pt was getting out of bed and slipped and fell hitting her head. Pt with a hematoma to the back of her head.Pt is alert and oriented x4 but has a history of dementia.

## 2022-11-25 NOTE — ED Provider Notes (Addendum)
[facility] Provider Note   CSN: [id number] Arrival date & time: 11/25/22  1929     History  Chief Complaint  Patient presents with   Kathy Skinner is a 77 y.o. female here presenting with fall.  Patient has a history of dementia and hypertension.  She is from [facility] facility.  She apparently slipped out of bed and hit her head.  Also was noted to have hematoma in the back of her head as well as right elbow pain.  Patient denies passing out.  The history is provided by the patient.       Home Medications Prior to Admission medications   Medication Sig Start Date End Date Taking? Authorizing Provider  albuterol (PROAIR HFA) 108 (90 Base) MCG/ACT inhaler Inhale 2 puffs into the lungs every 6 (six) hours as needed for wheezing or shortness of breath. 07/10/21   Collene Gobble, MD  albuterol (PROVENTIL) (2.5 MG/3ML) 0.083% nebulizer solution Take 3 mLs (2.5 mg total) by nebulization every 6 (six) hours as needed for wheezing or shortness of breath. 07/08/18   Collene Gobble, MD  busPIRone (BUSPAR) 5 MG tablet Take 5 mg by mouth 3 (three) times daily.    [provider]  denosumab (PROLIA) 60 MG/ML SOSY injection Inject 60 mg into the skin every 6 (six) months. 05/30/21   [provider]  donepezil (ARICEPT) 10 MG tablet Take 10 mg by mouth at bedtime.    [provider]  memantine (NAMENDA) 10 MG tablet Take 10 mg by mouth 2 (two) times daily. 06/07/21   [provider]  montelukast (SINGULAIR) 10 MG tablet TAKE ONE TABLET BY MOUTH EVERY MORNING 10/28/22   Collene Gobble, MD  omeprazole (PRILOSEC) 20 MG capsule Take 20 mg by mouth daily.    [provider]  pravastatin (PRAVACHOL) 10 MG tablet Take 10 mg by mouth daily.    [provider]  traZODone (DESYREL) 100 MG tablet Take 100 mg by mouth at bedtime.    [provider]  Donnal Debar 100-62.5-25 MCG/ACT AEPB  INHALE ONE PUFF BY MOUTH ONE TIME DAILY 11/17/22   Collene Gobble, MD      Allergies    Codeine, Neurontin [gabapentin], and Tramadol    Review of Systems   Review of Systems  Musculoskeletal:        Right elbow pain  All other systems reviewed and are negative.   Physical Exam Updated Vital Signs BP (!) 153/80 (BP Location: Left Arm)   Pulse (!) 57   Temp 97.6 F (36.4 C) (Oral)   Resp 16   Ht 5\' 2"  (1.575 m)   Wt 83.9 kg   SpO2 95%   BMI 33.84 kg/m  Physical Exam Vitals and nursing note reviewed.  Constitutional:      Comments: Demented  HENT:     Head:     Comments: Posterior scalp hematoma and no obvious laceration    Nose: Nose normal.     Mouth/Throat:     Mouth: Mucous membranes are moist.  Eyes:     Extraocular Movements: Extraocular movements intact.     Pupils: Pupils are equal, round, and reactive to light.  Cardiovascular:     Rate and Rhythm: Normal rate and regular rhythm.     Pulses: Normal pulses.     Heart sounds: Normal heart sounds.  Pulmonary:     Effort: Pulmonary effort is normal.  Breath sounds: Normal breath sounds.  Abdominal:     General: Abdomen is flat.     Palpations: Abdomen is soft.  Musculoskeletal:     Cervical back: Normal range of motion and neck supple.     Comments: Skin tear of the right elbow but normal range of motion.  Normal range of motion bilateral hips.  No obvious other extremity trauma  Skin:    General: Skin is warm.     Capillary Refill: Capillary refill takes less than 2 seconds.  Neurological:     Comments: Demented but ANO x 2.  Patient has normal strength and sensation bilateral arms and legs  Psychiatric:        Mood and Affect: Mood normal.        Behavior: Behavior normal.     ED Results / Procedures / Treatments   Labs (all labs ordered are listed, but only abnormal results are displayed) Labs Reviewed  CBG MONITORING, ED    EKG None  Radiology No results  found.  Procedures Procedures    Medications Ordered in ED Medications  acetaminophen (TYLENOL) tablet 650 mg (has no administration in time range)    ED Course/ Medical Decision Making/ A&P                             Medical Decision Making Kathy Skinner is a 77 y.o. female here presenting with a fall.  Patient slipped out of bed and hit her head.  Patient has a hematoma in the back of her head.  Patient is at her baseline mental status.  Plan to get CT head and cervical spine and x-rays.  8:54 PM I reviewed patient's imaging studies.  CT head and cervical spine unremarkable.  Chest x-ray unremarkable and pelvis x-ray showed prior acetabular fracture.  Patient is moving her hip and I do not think she has an occult hip or pelvic fracture.  At this point, patient is stable for discharge back to facility.  9:30 pm I was notified by nursing that she was unable to sit up.  I reexamined the patient and she has significant nystagmus to the left when she sits up.  I am concerned for postconcussive syndrome.  Plan to get lab work and give meclizine and Ativan and hydrate patient.  I also discussed case with the neurologist, Dr. Octaviano Glow. He states that since patient does not have a head bleed, this is not something that neurology needs to get involved in.  11:28 PM Patient is still unable to sit up without significant dizziness.  She still has significant nystagmus to the left.  Patient is from a assisted living facility.  At this point patient will be admitted for postconcussive syndrome.   Problems Addressed: Postconcussion syndrome: acute illness or injury  Amount and/or Complexity of Data Reviewed Labs: ordered. Radiology: ordered and independent interpretation performed. Decision-making details documented in ED Course. ECG/medicine tests: ordered.  Risk OTC drugs. Prescription drug management. Decision regarding hospitalization.    Final Clinical Impression(s) / ED  Diagnoses Final diagnoses:  None    Rx / DC Orders ED Discharge Orders     None         Drenda Freeze, MD 11/25/22 2055    Drenda Freeze, MD 11/25/22 [phone]

## 2022-11-26 ENCOUNTER — Observation Stay (HOSPITAL_COMMUNITY): Payer: 59

## 2022-11-26 DIAGNOSIS — F03A0 Unspecified dementia, mild, without behavioral disturbance, psychotic disturbance, mood disturbance, and anxiety: Secondary | ICD-10-CM | POA: Diagnosis not present

## 2022-11-26 DIAGNOSIS — R42 Dizziness and giddiness: Secondary | ICD-10-CM

## 2022-11-26 DIAGNOSIS — R519 Headache, unspecified: Secondary | ICD-10-CM | POA: Diagnosis not present

## 2022-11-26 DIAGNOSIS — J9611 Chronic respiratory failure with hypoxia: Secondary | ICD-10-CM | POA: Diagnosis not present

## 2022-11-26 DIAGNOSIS — J449 Chronic obstructive pulmonary disease, unspecified: Secondary | ICD-10-CM | POA: Diagnosis not present

## 2022-11-26 DIAGNOSIS — E669 Obesity, unspecified: Secondary | ICD-10-CM | POA: Diagnosis present

## 2022-11-26 LAB — CBC
HCT: 38.9 % (ref 36.0–46.0)
Hemoglobin: 13 g/dL (ref 12.0–15.0)
MCH: 33.2 pg (ref 26.0–34.0)
MCHC: 33.4 g/dL (ref 30.0–36.0)
MCV: 99.5 fL (ref 80.0–100.0)
Platelets: 144 10*3/uL — ABNORMAL LOW (ref 150–400)
RBC: 3.91 MIL/uL (ref 3.87–5.11)
RDW: 12.3 % (ref 11.5–15.5)
WBC: 6 10*3/uL (ref 4.0–10.5)
nRBC: 0 % (ref 0.0–0.2)

## 2022-11-26 LAB — BASIC METABOLIC PANEL
Anion gap: 7 (ref 5–15)
BUN: 5 mg/dL — ABNORMAL LOW (ref 8–23)
CO2: 27 mmol/L (ref 22–32)
Calcium: 8 mg/dL — ABNORMAL LOW (ref 8.9–10.3)
Chloride: 107 mmol/L (ref 98–111)
Creatinine, Ser: 0.6 mg/dL (ref 0.44–1.00)
GFR, Estimated: >60 mL/min (ref >60)
Glucose, Bld: 85 mg/dL (ref 70–99)
Potassium: 3.9 mmol/L (ref 3.5–5.1)
Sodium: 141 mmol/L (ref 135–145)

## 2022-11-26 LAB — MAGNESIUM: Magnesium: 1.9 mg/dL (ref 1.7–2.4)

## 2022-11-26 MED ORDER — GADOBUTROL 1 MMOL/ML IV SOLN
7.5000 mL | Freq: Once | INTRAVENOUS | Status: AC | PRN
  Administered 2022-11-26: 7.5 mL via INTRAVENOUS

## 2022-11-26 NOTE — Assessment & Plan Note (Signed)
Senile dementia without behavioral disturbance.  Stable.  Returned Namenda to twice daily dosing.

## 2022-11-26 NOTE — ED Notes (Signed)
ED TO INPATIENT HANDOFF REPORT  ED Nurse Name and Phone #: Randall Hiss 42  S Name/Age/Gender Kathy Skinner 77 y.o. female Room/Bed: 005C/005C  Code Status   Code Status: Full Code  Home/SNF/Other Skilled nursing facility Patient oriented to: self Is this baseline? Yes   Triage Complete: Triage complete  Chief Complaint Mild TBI ([place]) [S06.9XAA]  Triage Note Pt was getting out of bed and slipped and fell hitting her head. Pt with a hematoma to the back of her head.Pt is alert and oriented x4 but has a history of dementia.   Allergies Allergies  Allergen Reactions   Codeine Nausea Only   Neurontin [Gabapentin]     seizures   Tramadol Swelling    Level of Care/Admitting Diagnosis ED Disposition     ED Disposition  Admit   Condition  --   [place]: [place] [[id number]]  Level of Care: Telemetry Medical [104]  May place patient in observation at [facility] or [place] if equivalent level of care is available:: No  Covid Evaluation: Asymptomatic - no recent exposure (last 10 days) testing not required  Diagnosis: [id number]  Admitting Physician: Vianne Bulls [id number]  Attending Physician: Vianne Bulls [id number]          B Medical/Surgery History Past Medical History:  Diagnosis Date   Anxiety    Asthma    COPD (chronic obstructive pulmonary disease) ([place])    Panic attacks    Past Surgical History:  Procedure Laterality Date   TUBAL LIGATION       A IV Location/Drains/Wounds Patient Lines/Drains/Airways Status     Active Line/Drains/Airways     Name Placement date Placement time Site Days   Peripheral IV 11/25/22 20 G Anterior;Left Forearm 11/25/22  2140  Forearm  1            Intake/Output Last 24 hours No intake or output data in the 24 hours ending 11/26/22 0002  Labs/Imaging Results for orders placed or performed during the hospital encounter of 11/25/22 (from the past 48 hour(s))   CBG monitoring, ED     Status: None   Collection Time: 11/25/22  8:25 PM  Result Value Ref Range   Glucose-Capillary 87 70 - 99 mg/dL    Comment: Glucose reference range applies only to samples taken after fasting for at least 8 hours.  CBC with Differential     Status: None   Collection Time: 11/25/22  9:42 PM  Result Value Ref Range   WBC 7.3 4.0 - 10.5 K/uL   RBC 4.04 3.87 - 5.11 MIL/uL   Hemoglobin 13.1 12.0 - 15.0 g/dL   HCT 40.0 36.0 - 46.0 %   MCV 99.0 80.0 - 100.0 fL   MCH 32.4 26.0 - 34.0 pg   MCHC 32.8 30.0 - 36.0 g/dL   RDW 12.3 11.5 - 15.5 %   Platelets 161 150 - 400 K/uL   nRBC 0.0 0.0 - 0.2 %   Neutrophils Relative % 72 %   Neutro Abs 5.3 1.7 - 7.7 K/uL   Lymphocytes Relative 18 %   Lymphs Abs 1.3 0.7 - 4.0 K/uL   Monocytes Relative 6 %   Monocytes Absolute 0.4 0.1 - 1.0 K/uL   Eosinophils Relative 2 %   Eosinophils Absolute 0.2 0.0 - 0.5 K/uL   Basophils Relative 1 %   Basophils Absolute 0.1 0.0 - 0.1 K/uL   Immature Granulocytes 1 %   Abs Immature Granulocytes  0.04 0.00 - 0.07 K/uL    Comment: Performed at [facility], [place] [address], [place], [place] [id number]  Basic metabolic panel     Status: Abnormal   Collection Time: 11/25/22  9:42 PM  Result Value Ref Range   Sodium 141 135 - 145 mmol/L   Potassium 3.4 (L) 3.5 - 5.1 mmol/L   Chloride 108 98 - 111 mmol/L   CO2 26 22 - 32 mmol/L   Glucose, Bld 94 70 - 99 mg/dL    Comment: Glucose reference range applies only to samples taken after fasting for at least 8 hours.   BUN 7 (L) 8 - 23 mg/dL   Creatinine, Ser 0.62 0.44 - 1.00 mg/dL   Calcium 7.7 (L) 8.9 - 10.3 mg/dL   GFR, Estimated >60 >60 mL/min    Comment: (NOTE) Calculated using the CKD-EPI Creatinine Equation (2021)    Anion gap 7 5 - 15    Comment: Performed at [facility] [address], [place], [place] [address]   CT HEAD WO CONTRAST (5MM)  Result Date: 11/25/2022 CLINICAL DATA:  Head trauma EXAM: CT HEAD WITHOUT CONTRAST CT  CERVICAL SPINE WITHOUT CONTRAST TECHNIQUE: Multidetector CT imaging of the head and cervical spine was performed following the standard protocol without intravenous contrast. Multiplanar CT image reconstructions of the cervical spine were also generated. RADIATION DOSE REDUCTION: This exam was performed according to the departmental dose-optimization program which includes automated exposure control, adjustment of the mA and/or kV according to patient size and/or use of iterative reconstruction technique. COMPARISON:  CT brain and cervical spine 03/04/2011 FINDINGS: CT HEAD FINDINGS Brain: mild white matter hypodensity consistent with chronic small vessel ischemic change. Nonenlarged ventricles. No acute territorial infarction, hemorrhage or intracranial mass. Vascular: No hyperdense vessels.  Carotid vascular calcification. Skull: Normal. Negative for fracture or focal lesion. Sinuses/Orbits: No acute finding. Other: Moderate posterior scalp hematoma CT CERVICAL SPINE FINDINGS Alignment: No subluxation.  Facet alignment within normal limits. Skull base and vertebrae: No acute fracture. No primary bone lesion or focal pathologic process. Soft tissues and spinal canal: No prevertebral fluid or swelling. No visible canal hematoma. Disc levels: Multilevel degenerative changes. Moderate severe disc space narrowing C4-C5 and C5-C6. Facet degenerative changes at multiple levels with foraminal narrowing. Upper chest: Negative. Other: None IMPRESSION: No CT evidence for acute intracranial abnormality. Mild chronic small vessel ischemic changes of the white matter. Degenerative changes of the cervical spine. No acute osseous abnormality. Electronically Signed   By: Donavan Foil M.D.   On: 11/25/2022 20:26   CT Cervical Spine Wo Contrast  Result Date: 11/25/2022 CLINICAL DATA:  Head trauma EXAM: CT HEAD WITHOUT CONTRAST CT CERVICAL SPINE WITHOUT CONTRAST TECHNIQUE: Multidetector CT imaging of the head and cervical spine  was performed following the standard protocol without intravenous contrast. Multiplanar CT image reconstructions of the cervical spine were also generated. RADIATION DOSE REDUCTION: This exam was performed according to the departmental dose-optimization program which includes automated exposure control, adjustment of the mA and/or kV according to patient size and/or use of iterative reconstruction technique. COMPARISON:  CT brain and cervical spine 03/04/2011 FINDINGS: CT HEAD FINDINGS Brain: mild white matter hypodensity consistent with chronic small vessel ischemic change. Nonenlarged ventricles. No acute territorial infarction, hemorrhage or intracranial mass. Vascular: No hyperdense vessels.  Carotid vascular calcification. Skull: Normal. Negative for fracture or focal lesion. Sinuses/Orbits: No acute finding. Other: Moderate posterior scalp hematoma CT CERVICAL SPINE FINDINGS Alignment: No subluxation.  Facet alignment within  normal limits. Skull base and vertebrae: No acute fracture. No primary bone lesion or focal pathologic process. Soft tissues and spinal canal: No prevertebral fluid or swelling. No visible canal hematoma. Disc levels: Multilevel degenerative changes. Moderate severe disc space narrowing C4-C5 and C5-C6. Facet degenerative changes at multiple levels with foraminal narrowing. Upper chest: Negative. Other: None IMPRESSION: No CT evidence for acute intracranial abnormality. Mild chronic small vessel ischemic changes of the white matter. Degenerative changes of the cervical spine. No acute osseous abnormality. Electronically Signed   By: Donavan Foil M.D.   On: 11/25/2022 20:26   DG Pelvis 1-2 Views  Result Date: 11/25/2022 CLINICAL DATA:  Fall exiting bed EXAM: PELVIS - 1-2 VIEW COMPARISON:  CT pelvis 02/05/2018 FINDINGS: Deformity from prior healed left anterior acetabular column fracture observed. Taking these deformities into account, no acute fracture is identified. The left hip is  internally rotated compared to the right. Aortoiliac atherosclerotic vascular calcifications noted. Lower lumbar spondylosis. IMPRESSION: 1. No acute fracture is identified. 2. Deformity from prior healed left anterior acetabular column fracture. I do not see a definite superimposed acute fracture, but if the patient is unable to bear weight then CT of the bony pelvis may be warranted to exclude the unlikely possibility of acute on chronic injury. 3.  Aortic Atherosclerosis (ICD10-I70.0). 4. Lower lumbar spondylosis. Electronically Signed   By: Van Clines M.D.   On: 11/25/2022 20:08   DG Elbow Complete Right  Result Date: 11/25/2022 CLINICAL DATA:  Fall from bed EXAM: RIGHT ELBOW - COMPLETE 3+ VIEW COMPARISON:  None Available. FINDINGS: No elbow joint effusion or bulging of the supinator fat pad. Mild spurring of the radial head and coronoid process. Small ossicle along the common extensor tendon, well corticated, likely a chronically fragmented osteophyte. No fracture observed. IMPRESSION: 1. No fracture or elbow joint effusion identified. 2. Mild spurring of the radial head and coronoid process. Electronically Signed   By: Van Clines M.D.   On: 11/25/2022 20:05   DG Chest 1 View  Result Date: 11/25/2022 CLINICAL DATA:  Fall exiting bed EXAM: CHEST  1 VIEW COMPARISON:  04/11/2020 FINDINGS: The patient is rotated to the left on today's radiograph, reducing diagnostic sensitivity and specificity. Atherosclerotic calcification of the aortic arch. Borderline enlargement of the cardiopericardial silhouette, although difficult to accurately assess on this rotated projection. The lungs appear clear. No obvious blunting of the costophrenic angles. Likely remote left anterior sixth rib deformity/fracture. IMPRESSION: 1. Borderline enlargement of the cardiopericardial silhouette, without edema. 2. Likely remote left anterior sixth rib deformity/fracture. 3.  Aortic Atherosclerosis (ICD10-I70.0).  Electronically Signed   By: Van Clines M.D.   On: 11/25/2022 20:03    Pending Labs Unresulted Labs (From admission, onward)     Start     Ordered   11/26/22 [id number]  Basic metabolic panel  Tomorrow morning,   R        11/25/22 2342   11/26/22 0500  Magnesium  Tomorrow morning,   R        11/25/22 2342   11/26/22 0500  CBC  Tomorrow morning,   R        11/25/22 2342            Vitals/Pain Today's Vitals   11/25/22 1938 11/25/22 2030 11/25/22 2032 11/25/22 2323  BP: (!) 153/80 (!) 140/79  125/68  Pulse: (!) 57 (!) 55  69  Resp: 16 14  17   Temp: 97.6 F (36.4 C)   97.7 F (36.5  C)  TempSrc: Oral   Oral  SpO2: 95% 93%  93%  Weight:      Height:      PainSc:   7  7     Isolation Precautions No active isolations  Medications Medications  acetaminophen (TYLENOL) tablet 650 mg (650 mg Oral Patient Refused/Not Given 11/25/22 2023)  pravastatin (PRAVACHOL) tablet 10 mg (has no administration in time range)  busPIRone (BUSPAR) tablet 5 mg (5 mg Oral Given 11/25/22 2355)  donepezil (ARICEPT) tablet 10 mg (has no administration in time range)  memantine (NAMENDA) tablet 10 mg (has no administration in time range)  traZODone (DESYREL) tablet 100 mg (100 mg Oral Given 11/25/22 2354)  pantoprazole (PROTONIX) EC tablet 40 mg (has no administration in time range)  albuterol (PROVENTIL) (2.5 MG/3ML) 0.083% nebulizer solution 2.5 mg (has no administration in time range)  montelukast (SINGULAIR) tablet 10 mg (10 mg Oral Given 11/25/22 2355)  fluticasone furoate-vilanterol (BREO ELLIPTA) 100-25 MCG/ACT 1 puff (has no administration in time range)    And  umeclidinium bromide (INCRUSE ELLIPTA) 62.5 MCG/ACT 1 puff (has no administration in time range)  sodium chloride flush (NS) 0.9 % injection 3 mL (has no administration in time range)  acetaminophen (TYLENOL) tablet 650 mg (has no administration in time range)    Or  acetaminophen (TYLENOL) suppository 650 mg (has no administration  in time range)  polyethylene glycol (MIRALAX / GLYCOLAX) packet 17 g (has no administration in time range)  ondansetron (ZOFRAN) tablet 4 mg (has no administration in time range)    Or  ondansetron (ZOFRAN) injection 4 mg (has no administration in time range)  oxyCODONE (Oxy IR/ROXICODONE) immediate release tablet 2.5 mg (2.5 mg Oral Given 11/25/22 2354)  meclizine (ANTIVERT) tablet 25 mg (25 mg Oral Given 11/25/22 2145)  sodium chloride 0.9 % bolus 1,000 mL (0 mLs Intravenous Stopped 11/25/22 2236)  LORazepam (ATIVAN) injection 1 mg (1 mg Intravenous Given 11/25/22 2241)  potassium chloride SA (KLOR-CON M) CR tablet 20 mEq (20 mEq Oral Given 11/25/22 2355)    Mobility walks     Focused Assessments Cardiac Assessment Handoff:  Cardiac Rhythm: Sinus bradycardia No results found for: "CKTOTAL", "CKMB", "CKMBINDEX", "TROPONINI" Lab Results  Component Value Date   DDIMER <0.27 10/17/2016   Does the Patient currently have chest pain? No    R Recommendations: See Admitting Provider Note  Report given to:   Additional Notes: pt c/o very dizzy, 6th anterior rib fracture

## 2022-11-26 NOTE — Assessment & Plan Note (Signed)
No acute flareup.  Continue nebs

## 2022-11-26 NOTE — Hospital Course (Signed)
77 year old female with past medical history of dementia, COPD and obesity who presented to the emergency room on 3/26 evening after she fell and hit her head.  No loss of consciousness, but she is continued to be dizzy since.  Initial head CT unremarkable.  No other fractures noted.  By following day, patient's dizziness persisting and so MRI checked which was found to be unremarkable.  PT and OT evaluated patient and recommended home health.  By 3/28, patient feeling much better and dizziness had resolved.  Dizziness described as lightheadedness.

## 2022-11-26 NOTE — Assessment & Plan Note (Signed)
Meets criteria with BMI greater than 30 ?

## 2022-11-26 NOTE — TOC CAGE-AID Note (Signed)
Transition of Care [facility]) - CAGE-AID Screening   Patient Details  Name: Kathy Skinner MRN: [id number] Date of Birth: Jan 03, 1946  Transition of Care [facility]) CM/SW Contact:    Bethann Berkshire, [place] Phone Number: 11/26/2022, 9:33 AM     CAGE-AID Screening: Substance Abuse Screening unable to be completed due to: : Patient unable to participate

## 2022-11-26 NOTE — Progress Notes (Signed)
[facility] Progress Note  Patient: Kathy Skinner    [id number]  DOA: 11/25/2022    Date of Service: the patient was seen and examined on 11/26/2022  Brief hospital course: 77 year old female with past medical history of dementia, COPD and obesity who presented to the emergency room on 3/26 evening after she fell and hit her head.  No loss of consciousness, but she is continued to be dizzy since.  Initial head CT unremarkable.  No other fractures noted.   Assessment and Plan: * Dizziness Dizziness persisting.  Check MRI which was unremarkable.  Seen by PT and OT are recommending continued physical therapy.  Will plan to return back to facility 3/28 with physical therapy  COPD GOLD D No acute flareup.  Continue nebs  Chronic respiratory failure (HCC) At baseline, continue oxygen  Dementia ([place]) Senile dementia without behavioral disturbance.  Stable  Obesity (BMI 30-39.9) Meets criteria with BMI greater than 30       Body mass index is 33.84 kg/m.        Consultants: None  Procedures: None  Antimicrobials: None  Code Status: Full code   Subjective: Patient complains of continued dizziness, described as lightheadedness no room spinning  Objective: Blood pressure is increasing Vitals:   11/26/22 1223 11/26/22 1534  BP: (!) 152/80 (!) 111/55  Pulse: 74 77  Resp:    Temp: 98.4 F (36.9 C) 98.4 F (36.9 C)  SpO2: 98% 98%    Intake/Output Summary (Last 24 hours) at 11/26/2022 1759 Last data filed at 11/26/2022 0300 Gross per 24 hour  Intake --  Output 300 ml  Net -300 ml   Filed Weights   11/25/22 1934  Weight: 83.9 kg   Body mass index is 33.84 kg/m.  Exam:  General: Alert and oriented x 2, no acute distress HEENT: Normocephalic, atraumatic, mucous membranes slightly dry Cardiovascular: Giller rate and rhythm, S1-S2 Respiratory: Clear to auscultation bilaterally Abdomen: Soft, nontender, nondistended, positive bowel  sounds Musculoskeletal: No clubbing or cyanosis or edema Skin: No skin breaks, tears or lesions of Psychiatry: Great, no evidence of psychoses, chronic underlying mild dementia Neurology: No focal deficits  Data Reviewed: Labs unremarkable  Disposition:  Status is: Observation     Anticipated discharge date: 3/28  Family Communication: Will call family DVT Prophylaxis: SCDs Start: 11/25/22 2341    Author: Annita Brod ,MD 11/26/2022 5:59 PM  To reach On-call, see care teams to locate the attending and reach out via [email or web address]. Between 7PM-7AM, please contact night-coverage If you still have difficulty reaching the attending provider, please page the [facility] (Director on Call) for [facility] on amion for assistance.

## 2022-11-26 NOTE — Assessment & Plan Note (Addendum)
Dizziness persisting.  Patient clarifying the feels more like lightheadedness rather than room spinning.  Check MRI which was unremarkable.  Seen by PT and OT are recommending continued physical therapy.  Will plan to return back to facility 3/28 with physical therapy.  Patient also feeling much better.  Have decreased her dose of trazodone to ensure that this is not a contributing factor.

## 2022-11-26 NOTE — Plan of Care (Signed)

## 2022-11-26 NOTE — Assessment & Plan Note (Signed)
At baseline, continue oxygen

## 2022-11-26 NOTE — Evaluation (Signed)
Occupational Therapy Evaluation Patient Details Name: Kathy Skinner MRN: [id number] DOB: 03-Mar-1946 Today's Date: 11/26/2022   History of Present Illness 77 y.o. female presents to [facility] hospital on 11/25/2022 after a fall, striking her head. CT negative for acute findings. PMH includes dementia, anxiety, asthma, COPD, chronic respiratory failure.   Clinical Impression   Kathy Skinner was evaluated s/p the above admission list. She reports being mod I/indep at baseline, however no family present to confirm. Upon evaluation she was limited by dizziness with nystagmus after bed mobility and with extreme head turns, decreased activity tolerance and safety awareness. Overall she needed up to min G for mobility within the room and bathroom and min G for ADLs. Cues given throughout for safety. Pt will benefit from continued acute OT services. Pt will benefit from continued OT in pt's natural environment at discharge with increased support from staff/family initially.       Recommendations for follow up therapy are one component of a multi-disciplinary discharge planning process, led by the attending physician.  Recommendations may be updated based on patient status, additional functional criteria and insurance authorization.   Assistance Recommended at Discharge Intermittent Supervision/Assistance  Patient can return home with the following A little help with walking and/or transfers;A little help with bathing/dressing/bathroom;Assistance with cooking/housework;Direct supervision/assist for medications management;Direct supervision/assist for financial management;Assist for transportation;Help with stairs or ramp for entrance    Functional Status Assessment  Patient has had a recent decline in their functional status and demonstrates the ability to make significant improvements in function in a reasonable and predictable amount of time.  Equipment Recommendations  None recommended by OT       Precautions /  Restrictions Precautions Precautions: Fall Precaution Comments: dizziness Restrictions Weight Bearing Restrictions: No      Mobility Bed Mobility Overal bed mobility: Needs Assistance Bed Mobility: Supine to Sit, Sit to Supine     Supine to sit: Supervision Sit to supine: Supervision        Transfers Overall transfer level: Needs assistance Equipment used: None Transfers: Sit to/from Stand Sit to Stand: Min guard           General transfer comment: min G from lower toilet height      Balance Overall balance assessment: Needs assistance Sitting-balance support: No upper extremity supported, Feet supported Sitting balance-Leahy Scale: Good     Standing balance support: No upper extremity supported, During functional activity Standing balance-Leahy Scale: Fair                             ADL either performed or assessed with clinical judgement   ADL Overall ADL's : Needs assistance/impaired Eating/Feeding: Independent;Sitting   Grooming: Min guard;Standing Grooming Details (indicate cue type and reason): at the sink Upper Body Bathing: Set up;Sitting   Lower Body Bathing: Min guard;Sit to/from stand   Upper Body Dressing : Set up;Sitting   Lower Body Dressing: Min guard;Sit to/from stand   Toilet Transfer: Min guard;Ambulation;Regular Toilet;Grab bars Toilet Transfer Details (indicate cue type and reason): declined AD Toileting- Clothing Manipulation and Hygiene: Min guard;Sit to/from stand Toileting - Clothing Manipulation Details (indicate cue type and reason): cues needed     Functional mobility during ADLs: Min guard General ADL Comments: cues throughout for safety     Vision Baseline Vision/History: 0 No visual deficits Vision Assessment?: Vision impaired- to be further tested in functional context Additional Comments: nystagmus upon sitting     Perception Perception Perception Tested?:  No   Praxis Praxis Praxis tested?: Not  tested    Pertinent Vitals/Pain Pain Assessment Pain Assessment: Faces Faces Pain Scale: Hurts a little bit Pain Descriptors / Indicators: Headache Pain Intervention(s): Limited activity within patient's tolerance, Monitored during session     Hand Dominance Right   Extremity/Trunk Assessment Upper Extremity Assessment Upper Extremity Assessment: Overall WFL for tasks assessed   Lower Extremity Assessment Lower Extremity Assessment: Defer to PT evaluation   Cervical / Trunk Assessment Cervical / Trunk Assessment: Kyphotic   Communication Communication Communication: No difficulties   Cognition Arousal/Alertness: Awake/alert Behavior During Therapy: WFL for tasks assessed/performed Overall Cognitive Status: History of cognitive impairments - at baseline                                 General Comments: followed 2-3 step commands, good awareness of environment. Intermittent confusion to situation noted. sequenced through functional tasks appropriately     General Comments  Pt on 2L at the start, VSS on RA throughout. Nystagmus noted after bed mobility with positive report of dizziness    Exercises     Shoulder Instructions      Home Living Family/patient expects to be discharged to:: Assisted living                             Home Equipment: None   Additional Comments: pt lives at [place] per chart      Prior Functioning/Environment Prior Level of Function : Patient poor historian/Family not available             Mobility Comments: pt reports ambulating without assistance or DME. PT attempted to call pt's daughter however she did not answer ADLs Comments: pt reports being mod I        OT Problem List: Decreased strength;Decreased range of motion;Decreased activity tolerance;Impaired balance (sitting and/or standing);Decreased safety awareness;Decreased knowledge of use of DME or AE;Decreased knowledge of precautions       OT Treatment/Interventions: Self-care/ADL training;Therapeutic exercise;DME and/or AE instruction;Therapeutic activities;Patient/family education;Balance training    OT Goals(Current goals can be found in the care plan section) Acute Rehab OT Goals Patient Stated Goal: to sleep OT Goal Formulation: With patient Time For Goal Achievement: 12/10/22 Potential to Achieve Goals: Good ADL Goals Pt Will Perform Grooming: with modified independence;standing Pt Will Perform Lower Body Dressing: with modified independence;sit to/from stand Pt Will Transfer to Toilet: with modified independence;ambulating Additional ADL Goal #1: Pt will safely navigate hospital environment with no safety concerns to demonstrate decreased fall risk at discharge  OT Frequency: Min 2X/week    Co-evaluation              AM-PAC OT "6 Clicks" Daily Activity     Outcome Measure Help from another person eating meals?: None Help from another person taking care of personal grooming?: A Little Help from another person toileting, which includes using toliet, bedpan, or urinal?: A Little Help from another person bathing (including washing, rinsing, drying)?: A Little Help from another person to put on and taking off regular upper body clothing?: None Help from another person to put on and taking off regular lower body clothing?: A Little 6 Click Score: 20   End of Session Equipment Utilized During Treatment: Gait belt Nurse Communication: Mobility status (BM adn no purwick)  Activity Tolerance: Patient tolerated treatment well Patient left: in bed;with call bell/phone  within reach;with bed alarm set  OT Visit Diagnosis: Unsteadiness on feet (R26.81);Other abnormalities of gait and mobility (R26.89);Muscle weakness (generalized) (M62.81);History of falling (Z91.81);Dizziness and giddiness (R42)                Time: 1700-1720 OT Time Calculation (min): 20 min Charges:  OT General Charges $OT Visit: 1 Visit OT  Evaluation $OT Eval Moderate Complexity: [place], OTR/L Acute Rehabilitation Services Office [place] Communication Preferred   Elliot Cousin 11/26/2022, 5:44 PM

## 2022-11-26 NOTE — Evaluation (Signed)
Physical Therapy Evaluation Patient Details Name: Kathy Skinner MRN: [id number] DOB: 1946/06/21 Today's Date: 11/26/2022  History of Present Illness  77 y.o. female presents to [facility] hospital on 11/25/2022 after a fall, striking her head. CT negative for acute findings. PMH includes dementia, anxiety, asthma, COPD, chronic respiratory failure.  Clinical Impression  Pt presents to PT with deficits in gait, balance, cognition, endurance. Pt reports dizziness and lightheadedness in standing during session, BP stable. PT notes nystagmus with end range pursuits to left side, with pt reports of mild symptoms of lightheadedness. Pt is able to ambulate for household distances with assist from PT for safety. Pt will benefit from continued acute PT services in an effort to further assess potential vestibular involvement of symptoms (may be difficult due to cognitive impairment) and to improve stability. Pt may benefit from UE support of DME to improve balance.      Recommendations for follow up therapy are one component of a multi-disciplinary discharge planning process, led by the attending physician.  Recommendations may be updated based on patient status, additional functional criteria and insurance authorization.  Follow Up Recommendations       Assistance Recommended at Discharge Frequent or constant Supervision/Assistance  Patient can return home with the following  A little help with walking and/or transfers;A little help with bathing/dressing/bathroom;Assistance with cooking/housework;Assistance with feeding;Direct supervision/assist for medications management;Direct supervision/assist for financial management;Assist for transportation;Help with stairs or ramp for entrance    Equipment Recommendations  (TBD, will assess with rollator)  Recommendations for Other Services       Functional Status Assessment Patient has had a recent decline in their functional status and demonstrates the ability to  make significant improvements in function in a reasonable and predictable amount of time.     Precautions / Restrictions Precautions Precautions: Fall Precaution Comments: dizziness Restrictions Weight Bearing Restrictions: No      Mobility  Bed Mobility Overal bed mobility: Needs Assistance Bed Mobility: Supine to Sit, Sit to Supine     Supine to sit: Supervision Sit to supine: Supervision        Transfers Overall transfer level: Needs assistance Equipment used: None Transfers: Sit to/from Stand Sit to Stand: Supervision                Ambulation/Gait Ambulation/Gait assistance: Min guard Gait Distance (Feet): 80 Feet Assistive device: None Gait Pattern/deviations: Step-to pattern Gait velocity: reduced Gait velocity interpretation: <1.8 ft/sec, indicate of risk for recurrent falls   General Gait Details: slowed step-to gait, mild lateral drift  Stairs            Wheelchair Mobility    Modified Rankin (Stroke Patients Only)       Balance Overall balance assessment: Needs assistance Sitting-balance support: No upper extremity supported, Feet supported Sitting balance-Leahy Scale: Good     Standing balance support: No upper extremity supported, During functional activity Standing balance-Leahy Scale: Fair                               Pertinent Vitals/Pain Pain Assessment Pain Assessment: No/denies pain    Home Living Family/patient expects to be discharged to:: Assisted living                 Home Equipment: None Additional Comments: pt lives at [place] per chart    Prior Function Prior Level of Function : Patient poor historian/Family not available  Mobility Comments: pt reports ambulating without assistance or DME. PT attempted to call pt's daughter however she did not answer       Hand Dominance        Extremity/Trunk Assessment   Upper Extremity Assessment Upper Extremity  Assessment: Overall WFL for tasks assessed    Lower Extremity Assessment Lower Extremity Assessment: Generalized weakness    Cervical / Trunk Assessment Cervical / Trunk Assessment: Kyphotic  Communication   Communication: Expressive difficulties (pt often answering in one word answers, is able to respond in full sentences also)  Cognition Arousal/Alertness: Awake/alert Behavior During Therapy: WFL for tasks assessed/performed Overall Cognitive Status: History of cognitive impairments - at baseline                                 General Comments: chart notes dementia, pt is oriented to person, place intermittently. Pt has poor recall of situation.        General Comments General comments (skin integrity, edema, etc.): VSS on RA, pt reports dizziness and lightheadedness with standing initially. PT assesses pursuits, with which pt reports mild lightheadedness, PT notes nystagmus with end range gaze to left. Pt also reports very mild dizziness and lightheadedness when ambulating. Pt saturates 93% on 2L [place], desats to low 80s with attempts to wean to room air, pt does not demonstrate signs of increased work of breathing during session.    Exercises     Assessment/Plan    PT Assessment Patient needs continued PT services  PT Problem List Decreased activity tolerance;Decreased balance;Decreased mobility;Decreased cognition       PT Treatment Interventions DME instruction;Gait training;Functional mobility training;Therapeutic activities;Therapeutic exercise;Balance training;Neuromuscular re-education;Patient/family education;Cognitive remediation    PT Goals (Current goals can be found in the Care Plan section)  Acute Rehab PT Goals Patient Stated Goal: to go home PT Goal Formulation: With patient Time For Goal Achievement: 12/10/22 Potential to Achieve Goals: Fair    Frequency Min 3X/week     Co-evaluation               AM-PAC PT "6 Clicks" Mobility   Outcome Measure Help needed turning from your back to your side while in a flat bed without using bedrails?: A Little Help needed moving from lying on your back to sitting on the side of a flat bed without using bedrails?: A Little Help needed moving to and from a bed to a chair (including a wheelchair)?: A Little Help needed standing up from a chair using your arms (e.g., wheelchair or bedside chair)?: A Little Help needed to walk in hospital room?: A Little Help needed climbing 3-5 steps with a railing? : A Lot 6 Click Score: 17    End of Session Equipment Utilized During Treatment: Oxygen;Gait belt Activity Tolerance: Treatment limited secondary to medical complications (Comment) (reports of dizziness/lightheadedness) Patient left: in bed;with call bell/phone within reach Nurse Communication: Mobility status PT Visit Diagnosis: Other abnormalities of gait and mobility (R26.89);Other symptoms and signs involving the nervous system (R29.898)    Time: [id number] PT Time Calculation (min) (ACUTE ONLY): 19 min   Charges:   PT Evaluation $PT Eval Low Complexity: [place], PT, DPT Acute Rehabilitation Office [phone]   Zenaida Niece 11/26/2022, 4:29 PM

## 2022-11-27 DIAGNOSIS — F03A0 Unspecified dementia, mild, without behavioral disturbance, psychotic disturbance, mood disturbance, and anxiety: Secondary | ICD-10-CM | POA: Diagnosis not present

## 2022-11-27 DIAGNOSIS — R42 Dizziness and giddiness: Secondary | ICD-10-CM | POA: Diagnosis not present

## 2022-11-27 DIAGNOSIS — J9611 Chronic respiratory failure with hypoxia: Secondary | ICD-10-CM | POA: Diagnosis not present

## 2022-11-27 DIAGNOSIS — R404 Transient alteration of awareness: Secondary | ICD-10-CM | POA: Diagnosis not present

## 2022-11-27 DIAGNOSIS — Z743 Need for continuous supervision: Secondary | ICD-10-CM | POA: Diagnosis not present

## 2022-11-27 DIAGNOSIS — J449 Chronic obstructive pulmonary disease, unspecified: Secondary | ICD-10-CM | POA: Diagnosis not present

## 2022-11-27 MED ORDER — POLYETHYLENE GLYCOL 3350 17 G PO PACK: 17.0000 g | PACK | Freq: Every day | ORAL | 0 refills | Status: AC | PRN

## 2022-11-27 MED ORDER — MEMANTINE HCL 10 MG PO TABS: 10.0000 mg | ORAL_TABLET | Freq: Two times a day (BID) | ORAL | 1 refills | Status: AC

## 2022-11-27 MED ORDER — ACETAMINOPHEN 325 MG PO TABS: 650.0000 mg | ORAL_TABLET | Freq: Four times a day (QID) | ORAL | Status: AC | PRN

## 2022-11-27 MED ORDER — ALBUTEROL SULFATE (2.5 MG/3ML) 0.083% IN NEBU: 2.5000 mg | INHALATION_SOLUTION | Freq: Four times a day (QID) | RESPIRATORY_TRACT | 3 refills | Status: AC | PRN

## 2022-11-27 MED ORDER — TRAZODONE HCL 100 MG PO TABS: 100.0000 mg | ORAL_TABLET | Freq: Every day | ORAL | 0 refills | Status: AC

## 2022-11-27 NOTE — Care Management Obs Status (Signed)
[place] NOTIFICATION   Patient Details  Name: Kathy Skinner MRN: [id number] Date of Birth: 1945/11/02   Medicare Observation Status Notification Given:  Yes    Geralynn Ochs, LCSW 11/27/2022, 11:09 AM

## 2022-11-27 NOTE — NC FL2 (Signed)
[place] MEDICAID FL2 LEVEL OF CARE FORM     IDENTIFICATION  Patient Name: Kathy Skinner Birthdate: Apr 14, 1946 Sex: female Admission Date (Current Location): 11/25/2022  [facility] and [place] Number:  Herbalist and Address:  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   Dementia ([place]) 11/25/2022   Tobacco use 07/08/2018   Chronic respiratory failure ([place]) 11/04/2016   GERD (gastroesophageal reflux disease) 12/17/2015   Hypersomnolence 02/13/2015   COPD GOLD D 02/03/2013   Allergic rhinitis 02/03/2013   Generalized anxiety disorder 02/03/2013    Orientation RESPIRATION BLADDER Height & Weight     Self, Place, Time, Situation  Normal Continent Weight: 185 lb (83.9 kg) Height:  5\' 2"  (157.5 cm)  BEHAVIORAL SYMPTOMS/MOOD NEUROLOGICAL BOWEL NUTRITION STATUS      Continent Diet (see discharge summary)  AMBULATORY STATUS COMMUNICATION OF NEEDS Skin   Limited Assist Verbally Skin abrasions (Right arm, scratch marks, barrier cream)                       Personal Care Assistance Level of Assistance  Bathing, Feeding, Dressing Bathing Assistance: Limited assistance Feeding assistance: Limited assistance Dressing Assistance: Limited assistance     Functional Limitations Info             SPECIAL CARE FACTORS FREQUENCY  PT (By licensed PT), OT (By licensed OT)     PT Frequency: 5x/week OT Frequency: 5x/week            Contractures  Contractures Info: Not present    Additional Factors Info  Code Status, Allergies, Psychotropic Code Status Info: FULL Allergies Info: Codeine,  Neurontin (Gabapentin), Tramadol Psychotropic Info: busPIRone (BUSPAR) tablet 5 mg, 3x daily, PO donepezil (ARICEPT) tablet 10 mg, daily at bedtime, PO         Current Medications (11/27/2022):  This is the current hospital active medication list Current Facility-Administered Medications  Medication Dose Route Frequency Provider Last Rate Last Admin   acetaminophen (TYLENOL) tablet 650 mg  650 mg Oral Q6H PRN Opyd, Ilene Qua, MD       Or   acetaminophen (TYLENOL) suppository 650 mg  650 mg Rectal Q6H PRN Opyd, Ilene Qua, MD       acetaminophen (TYLENOL) tablet 650 mg  650 mg Oral Once Opyd, Ilene Qua, MD       albuterol (PROVENTIL) (2.5 MG/3ML) 0.083% nebulizer solution 2.5 mg  2.5 mg Inhalation Q6H PRN Opyd, Ilene Qua, MD       busPIRone (BUSPAR) tablet 5 mg  5 mg Oral TID Opyd, Ilene Qua, MD   5 mg at 11/27/22 0854   donepezil (ARICEPT) tablet 10 mg  10 mg Oral QHS Opyd, Ilene Qua, MD   10 mg at 11/26/22 2159   fluticasone furoate-vilanterol (BREO ELLIPTA) 100-25 MCG/ACT 1 puff  1 puff Inhalation Daily Opyd, Ilene Qua, MD   1 puff at 11/27/22 [phone]  And   umeclidinium bromide (INCRUSE ELLIPTA) 62.5 MCG/ACT 1 puff  1 puff Inhalation Daily Opyd, Ilene Qua, MD   1 puff at 11/27/22 0751   memantine (NAMENDA) tablet 10 mg  10 mg Oral BID Opyd, Ilene Qua, MD   10 mg at 11/27/22 0854   montelukast (SINGULAIR) tablet 10 mg  10 mg Oral QHS Opyd, Ilene Qua, MD   10 mg at 11/26/22 2158   ondansetron (ZOFRAN) tablet 4 mg  4 mg Oral Q6H PRN Opyd, Ilene Qua, MD       Or   ondansetron (ZOFRAN) injection 4 mg  4 mg Intravenous Q6H PRN Opyd, Ilene Qua, MD       oxyCODONE (Oxy IR/ROXICODONE) immediate release tablet 2.5 mg  2.5 mg Oral Q6H PRN Opyd, Ilene Qua, MD   2.5 mg at 11/25/22 2354   pantoprazole (PROTONIX) EC tablet 40 mg  40 mg Oral Daily Opyd, Ilene Qua, MD   40 mg at 11/27/22 0854   polyethylene glycol (MIRALAX / GLYCOLAX) packet 17 g  17 g Oral Daily PRN Opyd, Ilene Qua, MD       pravastatin (PRAVACHOL) tablet 10 mg  10 mg Oral Daily Opyd, Ilene Qua, MD   10 mg at 11/27/22 0857   sodium chloride flush (NS) 0.9 % injection 3 mL  3 mL Intravenous Q12H Opyd, Ilene Qua, MD   3 mL at 11/27/22 0857   traZODone (DESYREL) tablet 100 mg  100 mg Oral QHS Opyd, Ilene Qua, MD   100 mg at 11/25/22 2354     Discharge Medications: STOP taking these medications     lactose free nutrition Liqd    omeprazole 20 MG capsule Commonly known as: PRILOSEC           TAKE these medications     acetaminophen 325 MG tablet Commonly known as: TYLENOL Take 2 tablets (650 mg total) by mouth every 6 (six) hours as needed for mild pain (or Fever >/= 101).    albuterol 108 (90 Base) MCG/ACT inhaler Commonly known as: ProAir HFA Inhale 2 puffs into the lungs every 6 (six) hours as needed for wheezing or shortness of breath. What changed: when to take this    albuterol (2.5 MG/3ML) 0.083% nebulizer solution Commonly known as: PROVENTIL Take 3 mLs (2.5 mg total) by nebulization every 6 (six) hours as needed for wheezing or shortness of breath. What changed: Another medication with the same name was changed. Make sure you understand how and when to take each.    busPIRone 5 MG tablet Commonly known as: BUSPAR Take 5 mg by mouth 3 (three) times daily.    celecoxib 100 MG capsule Commonly known as: CELEBREX Take 100 mg by mouth daily.    donepezil 10 MG tablet Commonly known as: ARICEPT Take 10 mg by mouth daily.    escitalopram 10 MG tablet Commonly known as: LEXAPRO Take 10 mg by mouth daily.    folic acid Q000111Q MCG tablet Commonly known as: FOLVITE Take 800 mcg by mouth daily.    memantine 10 MG tablet Commonly known as: NAMENDA Take 1 tablet (10 mg total) by mouth 2 (two) times daily. What changed: when to take this    montelukast 10 MG  tablet Commonly known as: SINGULAIR TAKE ONE TABLET BY MOUTH EVERY MORNING What changed: when to take this    polyethylene glycol 17 g packet Commonly known as: MIRALAX / GLYCOLAX Take 17 g by mouth daily as needed for mild  constipation.    pravastatin 10 MG tablet Commonly known as: PRAVACHOL Take 10 mg by mouth at bedtime.    traZODone 100 MG tablet Commonly known as: DESYREL Take 1 tablet (100 mg total) by mouth at bedtime. What changed: Another medication with the same name was removed. Continue taking this medication, and follow the directions you see here.    Trelegy Ellipta 100-62.5-25 MCG/ACT Aepb Generic drug: Fluticasone-Umeclidin-Vilant INHALE ONE PUFF BY MOUTH ONE TIME DAILY What changed: See the new instructions.      Relevant Imaging Results:  Relevant Lab Results:   Additional Information SSN [id number]  Skylar Priest A [place], [place]

## 2022-11-27 NOTE — TOC Transition Note (Signed)
Transition of Care [facility]) - CM/SW Discharge Note   Patient Details  Name: Kathy Skinner MRN: [id number] Date of Birth: 12-28-1945  Transition of Care [facility]) CM/SW Contact:  Geralynn Ochs, LCSW Phone Number: 11/27/2022, 12:56 PM   Clinical Narrative:   CSW confirmed with patient and daughter plan to return to [facility]. CSW spoke with Crystal at [facility], and they are able to accept patient back. [place] will setup home health with orders. CSW sent orders and discharge information, confirmed receipt. CSW discussed transportation, as daughter indicated that [place] would provide transport, but [place] indicated that they would not, CSW would need to arrange. CSW asked daughter about providing transport, and she has requested [facility] transportation. CSW explained possibility of insurance not covering as patient is ambulatory, and daughter indicated understanding. Transport arranged with [facility] for next available.  Nurse to call report to [phone].    Final next level of care: Skilled Nursing Facility Barriers to Discharge: Barriers Resolved   Patient Goals and CMS Choice CMS Medicare.gov Compare Post Acute Care list provided to:: Patient Represenative (must comment) Choice offered to / list presented to : Adult Children  Discharge Placement                Patient chooses bed at: [facility] Patient to be transferred to facility by: [facility] Name of family member notified: Susie Patient and family notified of of transfer: 11/27/22  Discharge Plan and Services Additional resources added to the After Visit Summary for                            [facility] Arranged: PT, OT       Representative spoke with at [place]: [place] Determinants of Health ([place]) Interventions SDOH Screenings   Food Insecurity: No Food Insecurity (11/26/2022)  Housing: Low Risk  (11/26/2022)  Transportation Needs: No Transportation Needs  (11/26/2022)  Utilities: Not At Risk (11/26/2022)  Tobacco Use: High Risk (11/25/2022)     Readmission Risk Interventions     No data to display

## 2022-11-27 NOTE — Progress Notes (Signed)
Report called to DeeDee at [facility]. RN confirmed she cared for this pt prior to her coming to the hospital. Full report given. All questions answered.

## 2022-11-27 NOTE — Progress Notes (Signed)
Discharge packet provided with teach-back method as well as printed prescription. VS wnL and as per flow. IV removed, Pt verbalized understanding. All questions and concerns addressed. Awaiting on transport.

## 2022-11-27 NOTE — Progress Notes (Signed)
Physical Therapy Treatment Patient Details Name: Kathy Skinner MRN: [id number] DOB: 12-Aug-1946 Today's Date: 11/27/2022   History of Present Illness 77 y.o. female presents to [facility] hospital on 11/25/2022 after a fall, striking her head. CT negative for acute findings. PMH includes dementia, anxiety, asthma, COPD, chronic respiratory failure.    PT Comments    Pt was seen for mobility on hallway with help to steady only initially, but then pt is demonstrating a lot of control even when looking side side or up down.  Her nystagmus was only symptomatic in getting up to side of bed and then cleared.  Have asked vestibular therapist for input on the patient, and will follow up with her for progression of gait and balance as tolerated.  MD is planning her rehab, which appears to be set up to come to her at SNF.  Follow up as pt stay permits.  Recommendations for follow up therapy are one component of a multi-disciplinary discharge planning process, led by the attending physician.  Recommendations may be updated based on patient status, additional functional criteria and insurance authorization.  Follow Up Recommendations       Assistance Recommended at Discharge Frequent or constant Supervision/Assistance  Patient can return home with the following A little help with walking and/or transfers;A little help with bathing/dressing/bathroom;Assistance with cooking/housework;Assistance with feeding;Direct supervision/assist for medications management;Direct supervision/assist for financial management;Assist for transportation;Help with stairs or ramp for entrance   Equipment Recommendations  None recommended by PT    Recommendations for Other Services       Precautions / Restrictions Precautions Precautions: Fall Precaution Comments: dizziness (clears off once OOB) Restrictions Weight Bearing Restrictions: No     Mobility  Bed Mobility Overal bed mobility: Needs Assistance Bed Mobility: Supine  to Sit, Sit to Supine     Supine to sit: Supervision Sit to supine: Supervision        Transfers Overall transfer level: Needs assistance Equipment used: None Transfers: Sit to/from Stand Sit to Stand: Supervision                Ambulation/Gait Ambulation/Gait assistance: Min guard Gait Distance (Feet): 150 Feet Assistive device: 1 person hand held assist Gait Pattern/deviations: Step-through pattern, Step-to pattern, Decreased stride length Gait velocity: reduced Gait velocity interpretation: <1.31 ft/sec, indicative of household ambulator Pre-gait activities: standing balance ck General Gait Details: pt is more alert and coordinated and able to work on shifting her head during gait and walking distracted   Chief Strategy Officer    Modified Rankin (Stroke Patients Only)       Balance Overall balance assessment: Needs assistance Sitting-balance support: Feet supported Sitting balance-Leahy Scale: Good     Standing balance support: No upper extremity supported, During functional activity Standing balance-Leahy Scale: Fair                              Cognition Arousal/Alertness: Awake/alert Behavior During Therapy: WFL for tasks assessed/performed Overall Cognitive Status: History of cognitive impairments - at baseline                                 General Comments: slow motor responses and has some difficulty with instructions, minor delays        Exercises      General Comments General comments (skin integrity, edema,  etc.): pt is up to side of bed with dizziness in bed, resulting in R eye nystagmus with sitting from coming off L side of bed      Pertinent Vitals/Pain Pain Assessment Pain Assessment: Faces Faces Pain Scale: No hurt Pain Intervention(s): Monitored during session    Home Living                          Prior Function            PT Goals (current goals can now  be found in the care plan section) Acute Rehab PT Goals Patient Stated Goal: to go home Progress towards PT goals: Progressing toward goals    Frequency    Min 3X/week      PT Plan Current plan remains appropriate    Co-evaluation              AM-PAC PT "6 Clicks" Mobility   Outcome Measure  Help needed turning from your back to your side while in a flat bed without using bedrails?: A Little Help needed moving from lying on your back to sitting on the side of a flat bed without using bedrails?: A Little Help needed moving to and from a bed to a chair (including a wheelchair)?: A Little Help needed standing up from a chair using your arms (e.g., wheelchair or bedside chair)?: A Little Help needed to walk in hospital room?: A Little Help needed climbing 3-5 steps with a railing? : A Lot 6 Click Score: 17    End of Session Equipment Utilized During Treatment: Gait belt Activity Tolerance: Patient tolerated treatment well Patient left: in bed;with call bell/phone within reach Nurse Communication: Mobility status PT Visit Diagnosis: Other abnormalities of gait and mobility (R26.89);Other symptoms and signs involving the nervous system [id number])     Time: [id number] PT Time Calculation (min) (ACUTE ONLY): 15 min  Charges:  $Neuromuscular Re-education: 8-22 mins              Ramond Dial 11/27/2022, 1:16 PM  Mee Hives, PT PhD Acute Rehab Dept. Number: [place] and [place]

## 2022-11-27 NOTE — Discharge Summary (Signed)
Physician Discharge Summary   Patient: Kathy Skinner MRN: [id number] DOB: [birth year]  Admit date:     11/25/2022  Discharge date: 11/27/22  Discharge Physician: Annita Brod   PCP: Juanell Fairly, MD (Inactive)   Recommendations at discharge:   Medication clarification: Patient continued on albuterol inhaler 2 puffs 3 times a day scheduled and restarted back on albuterol nebulizer every 6 hours as needed for wheezing and shortness of breath Medication change: Namenda increased from 10 mg daily to twice daily New medication: Tylenol 650 p.o. every 6 hours as needed for mild pain or fever New medication: MiraLAX daily as needed Medication change: Trazodone decreased from 150 back down to 100 mg nightly Patient returning back to her facility, with home health PT and OT by following day, dizziness persisting.  MRI checked and found to be unremarkable.  Patient worked with PT and OT who recommended home health.  Discharge Diagnoses: Principal Problem:   Dizziness Active Problems:   COPD GOLD D   Chronic respiratory failure (HCC)   Dementia (HCC)   Mild TBI (HCC)   Obesity (BMI 30-39.9)  Resolved Problems:   * No resolved hospital problems. *  Hospital Course: 77 year old female with past medical history of dementia, COPD and obesity who presented to the emergency room on 3/26 evening after she fell and hit her head.  No loss of consciousness, but she is continued to be dizzy since.  Initial head CT unremarkable.  No other fractures noted.  By following day, patient's dizziness persisting and so MRI checked which was found to be unremarkable.  PT and OT evaluated patient and recommended home health.  By 3/28, patient feeling much better and dizziness had resolved.  Dizziness described as lightheadedness.  Assessment and Plan: * Dizziness Dizziness persisting.  Patient clarifying the feels more like lightheadedness rather than room spinning.  Check MRI which was unremarkable.   Seen by PT and OT are recommending continued physical therapy.  Will plan to return back to facility 3/28 with physical therapy.  Patient also feeling much better.  Have decreased her dose of trazodone to ensure that this is not a contributing factor.  COPD GOLD D No acute flareup.  Continue nebs  Chronic respiratory failure (HCC) At baseline, continue oxygen  Dementia ([place]) Senile dementia without behavioral disturbance.  Stable.  Returned Namenda to twice daily dosing.  Obesity (BMI 30-39.9) Meets criteria with BMI greater than 30         Consultants: None Procedures performed: None Disposition: Assisted living with home health Diet recommendation:  Discharge Diet Orders (From admission, onward)     Start     Ordered   11/27/22 0000  Diet general        11/27/22 1023           Regular diet DISCHARGE MEDICATION: Allergies as of 11/27/2022       Reactions   Codeine Nausea Only   Neurontin [gabapentin]    seizures   Tramadol Swelling        Medication List     STOP taking these medications    lactose free nutrition Liqd   omeprazole 20 MG capsule Commonly known as: PRILOSEC       TAKE these medications    acetaminophen 325 MG tablet Commonly known as: TYLENOL Take 2 tablets (650 mg total) by mouth every 6 (six) hours as needed for mild pain (or Fever >/= 101).   albuterol 108 (90 Base) MCG/ACT inhaler Commonly known as: ProAir  HFA Inhale 2 puffs into the lungs every 6 (six) hours as needed for wheezing or shortness of breath. What changed: when to take this   albuterol (2.5 MG/3ML) 0.083% nebulizer solution Commonly known as: PROVENTIL Take 3 mLs (2.5 mg total) by nebulization every 6 (six) hours as needed for wheezing or shortness of breath. What changed: Another medication with the same name was changed. Make sure you understand how and when to take each.   busPIRone 5 MG tablet Commonly known as: BUSPAR Take 5 mg by mouth 3 (three) times  daily.   celecoxib 100 MG capsule Commonly known as: CELEBREX Take 100 mg by mouth daily.   donepezil 10 MG tablet Commonly known as: ARICEPT Take 10 mg by mouth daily.   escitalopram 10 MG tablet Commonly known as: LEXAPRO Take 10 mg by mouth daily.   folic acid [id number] MCG tablet Commonly known as: FOLVITE Take 800 mcg by mouth daily.   memantine 10 MG tablet Commonly known as: NAMENDA Take 1 tablet (10 mg total) by mouth 2 (two) times daily. What changed: when to take this   montelukast 10 MG tablet Commonly known as: SINGULAIR TAKE ONE TABLET BY MOUTH EVERY MORNING What changed: when to take this   polyethylene glycol 17 g packet Commonly known as: MIRALAX / GLYCOLAX Take 17 g by mouth daily as needed for mild constipation.   pravastatin 10 MG tablet Commonly known as: PRAVACHOL Take 10 mg by mouth at bedtime.   traZODone 100 MG tablet Commonly known as: DESYREL Take 1 tablet (100 mg total) by mouth at bedtime. What changed: Another medication with the same name was removed. Continue taking this medication, and follow the directions you see here.   Trelegy Ellipta 100-62.5-25 MCG/ACT Aepb Generic drug: Fluticasone-Umeclidin-Vilant INHALE ONE PUFF BY MOUTH ONE TIME DAILY What changed: See the new instructions.        Discharge Exam: Filed Weights   11/25/22 1934  Weight: 83.9 kg   General: Alert and oriented x 2, no acute distress Cardiovascular: Regular rate and rhythm, S1-S2  Condition at discharge: good  The results of significant diagnostics from this hospitalization (including imaging, microbiology, ancillary and laboratory) are listed below for reference.   Imaging Studies: MR BRAIN W WO CONTRAST  Result Date: 11/26/2022 CLINICAL DATA:  Fall with head strike.  Headache and dizziness. EXAM: MRI HEAD WITHOUT AND WITH CONTRAST TECHNIQUE: Multiplanar, multiecho pulse sequences of the brain and surrounding structures were obtained without and with  intravenous contrast. CONTRAST:  7.65mL GADAVIST GADOBUTROL 1 MMOL/ML IV SOLN COMPARISON:  CT head 1 day prior FINDINGS: Brain: There is no acute intracranial hemorrhage, extra-axial fluid collection, or acute infarct. There is background parenchymal volume loss with prominence of the ventricular system and extra-axial CSF spaces. The ventricles are stable in size. Patchy FLAIR signal abnormality in the supratentorial white matter likely reflects mild-to-moderate chronic small-vessel ischemic change. There are no acute or chronic blood products. The pituitary and suprasellar region are normal. There is no mass lesion or abnormal enhancement. There is no mass effect or midline shift. Vascular: Normal flow voids. Skull and upper cervical spine: Normal marrow signal. Sinuses/Orbits: The paranasal sinuses are clear. Bilateral lens implants are in place. The globes and orbits are otherwise unremarkable. Other: Midline parietal scalp swelling/hematoma is again seen. IMPRESSION: 1. No acute intracranial pathology. 2. Midline parietal scalp swelling/hematoma. Electronically Signed   By: Valetta Mole M.D.   On: 11/26/2022 13:37   CT HEAD WO CONTRAST (5MM)  Result Date: 11/25/2022 CLINICAL DATA:  Head trauma EXAM: CT HEAD WITHOUT CONTRAST CT CERVICAL SPINE WITHOUT CONTRAST TECHNIQUE: Multidetector CT imaging of the head and cervical spine was performed following the standard protocol without intravenous contrast. Multiplanar CT image reconstructions of the cervical spine were also generated. RADIATION DOSE REDUCTION: This exam was performed according to the departmental dose-optimization program which includes automated exposure control, adjustment of the mA and/or kV according to patient size and/or use of iterative reconstruction technique. COMPARISON:  CT brain and cervical spine 03/04/2011 FINDINGS: CT HEAD FINDINGS Brain: mild white matter hypodensity consistent with chronic small vessel ischemic change. Nonenlarged  ventricles. No acute territorial infarction, hemorrhage or intracranial mass. Vascular: No hyperdense vessels.  Carotid vascular calcification. Skull: Normal. Negative for fracture or focal lesion. Sinuses/Orbits: No acute finding. Other: Moderate posterior scalp hematoma CT CERVICAL SPINE FINDINGS Alignment: No subluxation.  Facet alignment within normal limits. Skull base and vertebrae: No acute fracture. No primary bone lesion or focal pathologic process. Soft tissues and spinal canal: No prevertebral fluid or swelling. No visible canal hematoma. Disc levels: Multilevel degenerative changes. Moderate severe disc space narrowing C4-C5 and C5-C6. Facet degenerative changes at multiple levels with foraminal narrowing. Upper chest: Negative. Other: None IMPRESSION: No CT evidence for acute intracranial abnormality. Mild chronic small vessel ischemic changes of the white matter. Degenerative changes of the cervical spine. No acute osseous abnormality. Electronically Signed   By: Donavan Foil M.D.   On: 11/25/2022 20:26   CT Cervical Spine Wo Contrast  Result Date: 11/25/2022 CLINICAL DATA:  Head trauma EXAM: CT HEAD WITHOUT CONTRAST CT CERVICAL SPINE WITHOUT CONTRAST TECHNIQUE: Multidetector CT imaging of the head and cervical spine was performed following the standard protocol without intravenous contrast. Multiplanar CT image reconstructions of the cervical spine were also generated. RADIATION DOSE REDUCTION: This exam was performed according to the departmental dose-optimization program which includes automated exposure control, adjustment of the mA and/or kV according to patient size and/or use of iterative reconstruction technique. COMPARISON:  CT brain and cervical spine 03/04/2011 FINDINGS: CT HEAD FINDINGS Brain: mild white matter hypodensity consistent with chronic small vessel ischemic change. Nonenlarged ventricles. No acute territorial infarction, hemorrhage or intracranial mass. Vascular: No  hyperdense vessels.  Carotid vascular calcification. Skull: Normal. Negative for fracture or focal lesion. Sinuses/Orbits: No acute finding. Other: Moderate posterior scalp hematoma CT CERVICAL SPINE FINDINGS Alignment: No subluxation.  Facet alignment within normal limits. Skull base and vertebrae: No acute fracture. No primary bone lesion or focal pathologic process. Soft tissues and spinal canal: No prevertebral fluid or swelling. No visible canal hematoma. Disc levels: Multilevel degenerative changes. Moderate severe disc space narrowing C4-C5 and C5-C6. Facet degenerative changes at multiple levels with foraminal narrowing. Upper chest: Negative. Other: None IMPRESSION: No CT evidence for acute intracranial abnormality. Mild chronic small vessel ischemic changes of the white matter. Degenerative changes of the cervical spine. No acute osseous abnormality. Electronically Signed   By: Donavan Foil M.D.   On: 11/25/2022 20:26   DG Pelvis 1-2 Views  Result Date: 11/25/2022 CLINICAL DATA:  Fall exiting bed EXAM: PELVIS - 1-2 VIEW COMPARISON:  CT pelvis 02/05/2018 FINDINGS: Deformity from prior healed left anterior acetabular column fracture observed. Taking these deformities into account, no acute fracture is identified. The left hip is internally rotated compared to the right. Aortoiliac atherosclerotic vascular calcifications noted. Lower lumbar spondylosis. IMPRESSION: 1. No acute fracture is identified. 2. Deformity from prior healed left anterior acetabular column fracture. I do not see  a definite superimposed acute fracture, but if the patient is unable to bear weight then CT of the bony pelvis may be warranted to exclude the unlikely possibility of acute on chronic injury. 3.  Aortic Atherosclerosis (ICD10-I70.0). 4. Lower lumbar spondylosis. Electronically Signed   By: Van Clines M.D.   On: 11/25/2022 20:08   DG Elbow Complete Right  Result Date: 11/25/2022 CLINICAL DATA:  Fall from bed  EXAM: RIGHT ELBOW - COMPLETE 3+ VIEW COMPARISON:  None Available. FINDINGS: No elbow joint effusion or bulging of the supinator fat pad. Mild spurring of the radial head and coronoid process. Small ossicle along the common extensor tendon, well corticated, likely a chronically fragmented osteophyte. No fracture observed. IMPRESSION: 1. No fracture or elbow joint effusion identified. 2. Mild spurring of the radial head and coronoid process. Electronically Signed   By: Van Clines M.D.   On: 11/25/2022 20:05   DG Chest 1 View  Result Date: 11/25/2022 CLINICAL DATA:  Fall exiting bed EXAM: CHEST  1 VIEW COMPARISON:  04/11/2020 FINDINGS: The patient is rotated to the left on today's radiograph, reducing diagnostic sensitivity and specificity. Atherosclerotic calcification of the aortic arch. Borderline enlargement of the cardiopericardial silhouette, although difficult to accurately assess on this rotated projection. The lungs appear clear. No obvious blunting of the costophrenic angles. Likely remote left anterior sixth rib deformity/fracture. IMPRESSION: 1. Borderline enlargement of the cardiopericardial silhouette, without edema. 2. Likely remote left anterior sixth rib deformity/fracture. 3.  Aortic Atherosclerosis (ICD10-I70.0). Electronically Signed   By: Van Clines M.D.   On: 11/25/2022 20:03    Microbiology: Results for orders placed or performed during the hospital encounter of 01/15/13  Urine culture     Status: None   Collection Time: 01/15/13  8:09 PM  Result Value Ref Range Status   Specimen Description URINE, CATHETERIZED  Final   Special Requests AADED 2030  Final   Culture  Setup Time 01/16/2013 02:05  Final   Colony Count NO GROWTH  Final   Culture NO GROWTH  Final   Report Status 01/17/2013 FINAL  Final    Labs: CBC: Recent Labs  Lab 11/25/22 2142 11/26/22 0358  WBC 7.3 6.0  NEUTROABS 5.3  --   HGB 13.1 13.0  HCT 40.0 38.9  MCV 99.0 99.5  PLT 161 144*    Basic Metabolic Panel: Recent Labs  Lab 11/25/22 2142 11/26/22 0358  NA 141 141  K 3.4* 3.9  CL 108 107  CO2 26 27  GLUCOSE 94 85  BUN 7* 5*  CREATININE 0.62 0.60  CALCIUM 7.7* 8.0*  MG  --  1.9   Liver Function Tests: No results for input(s): "AST", "ALT", "ALKPHOS", "BILITOT", "PROT", "ALBUMIN" in the last 168 hours. CBG: Recent Labs  Lab 11/25/22 2025  GLUCAP 87    Discharge time spent: less than 30 minutes.  Signed: Annita Brod, MD [facility] 11/27/2022

## 2022-12-08 DIAGNOSIS — R296 Repeated falls: Secondary | ICD-10-CM | POA: Diagnosis not present

## 2022-12-08 DIAGNOSIS — R42 Dizziness and giddiness: Secondary | ICD-10-CM | POA: Diagnosis not present

## 2022-12-08 DIAGNOSIS — J449 Chronic obstructive pulmonary disease, unspecified: Secondary | ICD-10-CM | POA: Diagnosis not present

## 2022-12-16 DIAGNOSIS — J4489 Other specified chronic obstructive pulmonary disease: Secondary | ICD-10-CM | POA: Diagnosis not present

## 2022-12-16 DIAGNOSIS — F17210 Nicotine dependence, cigarettes, uncomplicated: Secondary | ICD-10-CM | POA: Diagnosis not present

## 2022-12-16 DIAGNOSIS — Z791 Long term (current) use of non-steroidal anti-inflammatories (NSAID): Secondary | ICD-10-CM | POA: Diagnosis not present

## 2022-12-16 DIAGNOSIS — Z9181 History of falling: Secondary | ICD-10-CM | POA: Diagnosis not present

## 2022-12-16 DIAGNOSIS — K589 Irritable bowel syndrome without diarrhea: Secondary | ICD-10-CM | POA: Diagnosis not present

## 2022-12-16 DIAGNOSIS — E509 Vitamin A deficiency, unspecified: Secondary | ICD-10-CM | POA: Diagnosis not present

## 2022-12-16 DIAGNOSIS — Z7951 Long term (current) use of inhaled steroids: Secondary | ICD-10-CM | POA: Diagnosis not present

## 2022-12-16 DIAGNOSIS — K219 Gastro-esophageal reflux disease without esophagitis: Secondary | ICD-10-CM | POA: Diagnosis not present

## 2022-12-16 DIAGNOSIS — F0394 Unspecified dementia, unspecified severity, with anxiety: Secondary | ICD-10-CM | POA: Diagnosis not present

## 2022-12-16 DIAGNOSIS — E785 Hyperlipidemia, unspecified: Secondary | ICD-10-CM | POA: Diagnosis not present

## 2022-12-16 DIAGNOSIS — M810 Age-related osteoporosis without current pathological fracture: Secondary | ICD-10-CM | POA: Diagnosis not present

## 2022-12-16 DIAGNOSIS — J9610 Chronic respiratory failure, unspecified whether with hypoxia or hypercapnia: Secondary | ICD-10-CM | POA: Diagnosis not present

## 2022-12-19 DIAGNOSIS — Z9181 History of falling: Secondary | ICD-10-CM | POA: Diagnosis not present

## 2022-12-19 DIAGNOSIS — K589 Irritable bowel syndrome without diarrhea: Secondary | ICD-10-CM | POA: Diagnosis not present

## 2022-12-19 DIAGNOSIS — F17210 Nicotine dependence, cigarettes, uncomplicated: Secondary | ICD-10-CM | POA: Diagnosis not present

## 2022-12-19 DIAGNOSIS — J4489 Other specified chronic obstructive pulmonary disease: Secondary | ICD-10-CM | POA: Diagnosis not present

## 2022-12-19 DIAGNOSIS — Z791 Long term (current) use of non-steroidal anti-inflammatories (NSAID): Secondary | ICD-10-CM | POA: Diagnosis not present

## 2022-12-19 DIAGNOSIS — E509 Vitamin A deficiency, unspecified: Secondary | ICD-10-CM | POA: Diagnosis not present

## 2022-12-19 DIAGNOSIS — F0394 Unspecified dementia, unspecified severity, with anxiety: Secondary | ICD-10-CM | POA: Diagnosis not present

## 2022-12-19 DIAGNOSIS — J9610 Chronic respiratory failure, unspecified whether with hypoxia or hypercapnia: Secondary | ICD-10-CM | POA: Diagnosis not present

## 2022-12-19 DIAGNOSIS — E785 Hyperlipidemia, unspecified: Secondary | ICD-10-CM | POA: Diagnosis not present

## 2022-12-19 DIAGNOSIS — K219 Gastro-esophageal reflux disease without esophagitis: Secondary | ICD-10-CM | POA: Diagnosis not present

## 2022-12-19 DIAGNOSIS — Z7951 Long term (current) use of inhaled steroids: Secondary | ICD-10-CM | POA: Diagnosis not present

## 2022-12-19 DIAGNOSIS — M810 Age-related osteoporosis without current pathological fracture: Secondary | ICD-10-CM | POA: Diagnosis not present

## 2022-12-24 DIAGNOSIS — J9610 Chronic respiratory failure, unspecified whether with hypoxia or hypercapnia: Secondary | ICD-10-CM | POA: Diagnosis not present

## 2022-12-24 DIAGNOSIS — Z9181 History of falling: Secondary | ICD-10-CM | POA: Diagnosis not present

## 2022-12-24 DIAGNOSIS — K589 Irritable bowel syndrome without diarrhea: Secondary | ICD-10-CM | POA: Diagnosis not present

## 2022-12-24 DIAGNOSIS — J4489 Other specified chronic obstructive pulmonary disease: Secondary | ICD-10-CM | POA: Diagnosis not present

## 2022-12-24 DIAGNOSIS — E509 Vitamin A deficiency, unspecified: Secondary | ICD-10-CM | POA: Diagnosis not present

## 2022-12-24 DIAGNOSIS — Z791 Long term (current) use of non-steroidal anti-inflammatories (NSAID): Secondary | ICD-10-CM | POA: Diagnosis not present

## 2022-12-24 DIAGNOSIS — E785 Hyperlipidemia, unspecified: Secondary | ICD-10-CM | POA: Diagnosis not present

## 2022-12-24 DIAGNOSIS — Z7951 Long term (current) use of inhaled steroids: Secondary | ICD-10-CM | POA: Diagnosis not present

## 2022-12-24 DIAGNOSIS — M810 Age-related osteoporosis without current pathological fracture: Secondary | ICD-10-CM | POA: Diagnosis not present

## 2022-12-24 DIAGNOSIS — F0394 Unspecified dementia, unspecified severity, with anxiety: Secondary | ICD-10-CM | POA: Diagnosis not present

## 2022-12-24 DIAGNOSIS — K219 Gastro-esophageal reflux disease without esophagitis: Secondary | ICD-10-CM | POA: Diagnosis not present

## 2022-12-24 DIAGNOSIS — F17210 Nicotine dependence, cigarettes, uncomplicated: Secondary | ICD-10-CM | POA: Diagnosis not present

## 2022-12-29 DIAGNOSIS — F17210 Nicotine dependence, cigarettes, uncomplicated: Secondary | ICD-10-CM | POA: Diagnosis not present

## 2022-12-29 DIAGNOSIS — K219 Gastro-esophageal reflux disease without esophagitis: Secondary | ICD-10-CM | POA: Diagnosis not present

## 2022-12-29 DIAGNOSIS — F0394 Unspecified dementia, unspecified severity, with anxiety: Secondary | ICD-10-CM | POA: Diagnosis not present

## 2022-12-29 DIAGNOSIS — J4489 Other specified chronic obstructive pulmonary disease: Secondary | ICD-10-CM | POA: Diagnosis not present

## 2022-12-29 DIAGNOSIS — Z791 Long term (current) use of non-steroidal anti-inflammatories (NSAID): Secondary | ICD-10-CM | POA: Diagnosis not present

## 2022-12-29 DIAGNOSIS — M810 Age-related osteoporosis without current pathological fracture: Secondary | ICD-10-CM | POA: Diagnosis not present

## 2022-12-29 DIAGNOSIS — Z9181 History of falling: Secondary | ICD-10-CM | POA: Diagnosis not present

## 2022-12-29 DIAGNOSIS — E785 Hyperlipidemia, unspecified: Secondary | ICD-10-CM | POA: Diagnosis not present

## 2022-12-29 DIAGNOSIS — Z7951 Long term (current) use of inhaled steroids: Secondary | ICD-10-CM | POA: Diagnosis not present

## 2022-12-29 DIAGNOSIS — K589 Irritable bowel syndrome without diarrhea: Secondary | ICD-10-CM | POA: Diagnosis not present

## 2022-12-29 DIAGNOSIS — J9610 Chronic respiratory failure, unspecified whether with hypoxia or hypercapnia: Secondary | ICD-10-CM | POA: Diagnosis not present

## 2022-12-29 DIAGNOSIS — E509 Vitamin A deficiency, unspecified: Secondary | ICD-10-CM | POA: Diagnosis not present

## 2023-01-05 DIAGNOSIS — M810 Age-related osteoporosis without current pathological fracture: Secondary | ICD-10-CM | POA: Diagnosis not present

## 2023-01-05 DIAGNOSIS — Z9181 History of falling: Secondary | ICD-10-CM | POA: Diagnosis not present

## 2023-01-05 DIAGNOSIS — K589 Irritable bowel syndrome without diarrhea: Secondary | ICD-10-CM | POA: Diagnosis not present

## 2023-01-05 DIAGNOSIS — E509 Vitamin A deficiency, unspecified: Secondary | ICD-10-CM | POA: Diagnosis not present

## 2023-01-05 DIAGNOSIS — Z791 Long term (current) use of non-steroidal anti-inflammatories (NSAID): Secondary | ICD-10-CM | POA: Diagnosis not present

## 2023-01-05 DIAGNOSIS — J9610 Chronic respiratory failure, unspecified whether with hypoxia or hypercapnia: Secondary | ICD-10-CM | POA: Diagnosis not present

## 2023-01-05 DIAGNOSIS — R0781 Pleurodynia: Secondary | ICD-10-CM | POA: Diagnosis not present

## 2023-01-05 DIAGNOSIS — F0394 Unspecified dementia, unspecified severity, with anxiety: Secondary | ICD-10-CM | POA: Diagnosis not present

## 2023-01-05 DIAGNOSIS — E785 Hyperlipidemia, unspecified: Secondary | ICD-10-CM | POA: Diagnosis not present

## 2023-01-05 DIAGNOSIS — K219 Gastro-esophageal reflux disease without esophagitis: Secondary | ICD-10-CM | POA: Diagnosis not present

## 2023-01-05 DIAGNOSIS — Z7951 Long term (current) use of inhaled steroids: Secondary | ICD-10-CM | POA: Diagnosis not present

## 2023-01-05 DIAGNOSIS — J4489 Other specified chronic obstructive pulmonary disease: Secondary | ICD-10-CM | POA: Diagnosis not present

## 2023-01-05 DIAGNOSIS — F17210 Nicotine dependence, cigarettes, uncomplicated: Secondary | ICD-10-CM | POA: Diagnosis not present

## 2023-01-05 DIAGNOSIS — J449 Chronic obstructive pulmonary disease, unspecified: Secondary | ICD-10-CM | POA: Diagnosis not present

## 2023-01-08 DIAGNOSIS — R079 Chest pain, unspecified: Secondary | ICD-10-CM | POA: Diagnosis not present

## 2023-01-12 DIAGNOSIS — Z7951 Long term (current) use of inhaled steroids: Secondary | ICD-10-CM | POA: Diagnosis not present

## 2023-01-12 DIAGNOSIS — J9610 Chronic respiratory failure, unspecified whether with hypoxia or hypercapnia: Secondary | ICD-10-CM | POA: Diagnosis not present

## 2023-01-12 DIAGNOSIS — E785 Hyperlipidemia, unspecified: Secondary | ICD-10-CM | POA: Diagnosis not present

## 2023-01-12 DIAGNOSIS — J4489 Other specified chronic obstructive pulmonary disease: Secondary | ICD-10-CM | POA: Diagnosis not present

## 2023-01-12 DIAGNOSIS — E509 Vitamin A deficiency, unspecified: Secondary | ICD-10-CM | POA: Diagnosis not present

## 2023-01-12 DIAGNOSIS — Z9181 History of falling: Secondary | ICD-10-CM | POA: Diagnosis not present

## 2023-01-12 DIAGNOSIS — F0394 Unspecified dementia, unspecified severity, with anxiety: Secondary | ICD-10-CM | POA: Diagnosis not present

## 2023-01-12 DIAGNOSIS — F17210 Nicotine dependence, cigarettes, uncomplicated: Secondary | ICD-10-CM | POA: Diagnosis not present

## 2023-01-12 DIAGNOSIS — K219 Gastro-esophageal reflux disease without esophagitis: Secondary | ICD-10-CM | POA: Diagnosis not present

## 2023-01-12 DIAGNOSIS — K589 Irritable bowel syndrome without diarrhea: Secondary | ICD-10-CM | POA: Diagnosis not present

## 2023-01-12 DIAGNOSIS — Z791 Long term (current) use of non-steroidal anti-inflammatories (NSAID): Secondary | ICD-10-CM | POA: Diagnosis not present

## 2023-01-12 DIAGNOSIS — M810 Age-related osteoporosis without current pathological fracture: Secondary | ICD-10-CM | POA: Diagnosis not present

## 2023-01-19 ENCOUNTER — Other Ambulatory Visit: Payer: Self-pay

## 2023-01-19 DIAGNOSIS — K589 Irritable bowel syndrome without diarrhea: Secondary | ICD-10-CM | POA: Diagnosis not present

## 2023-01-19 DIAGNOSIS — M810 Age-related osteoporosis without current pathological fracture: Secondary | ICD-10-CM

## 2023-01-19 DIAGNOSIS — Z7951 Long term (current) use of inhaled steroids: Secondary | ICD-10-CM | POA: Diagnosis not present

## 2023-01-19 DIAGNOSIS — F17210 Nicotine dependence, cigarettes, uncomplicated: Secondary | ICD-10-CM | POA: Diagnosis not present

## 2023-01-19 DIAGNOSIS — J9610 Chronic respiratory failure, unspecified whether with hypoxia or hypercapnia: Secondary | ICD-10-CM | POA: Diagnosis not present

## 2023-01-19 DIAGNOSIS — F0394 Unspecified dementia, unspecified severity, with anxiety: Secondary | ICD-10-CM | POA: Diagnosis not present

## 2023-01-19 DIAGNOSIS — E785 Hyperlipidemia, unspecified: Secondary | ICD-10-CM | POA: Diagnosis not present

## 2023-01-19 DIAGNOSIS — J4489 Other specified chronic obstructive pulmonary disease: Secondary | ICD-10-CM | POA: Diagnosis not present

## 2023-01-19 DIAGNOSIS — E509 Vitamin A deficiency, unspecified: Secondary | ICD-10-CM | POA: Diagnosis not present

## 2023-01-19 DIAGNOSIS — Z791 Long term (current) use of non-steroidal anti-inflammatories (NSAID): Secondary | ICD-10-CM | POA: Diagnosis not present

## 2023-01-19 DIAGNOSIS — K219 Gastro-esophageal reflux disease without esophagitis: Secondary | ICD-10-CM | POA: Diagnosis not present

## 2023-01-19 DIAGNOSIS — Z9181 History of falling: Secondary | ICD-10-CM | POA: Diagnosis not present

## 2023-01-23 ENCOUNTER — Telehealth: Payer: Self-pay | Admitting: Pharmacy Technician

## 2023-01-23 NOTE — Telephone Encounter (Signed)
Auth Submission: APPROVED Site of care: Site of care: [facility] Payer: [facility] MEDICARE Medication & CPT/J Code(s) submitted: Prolia (Denosumab) [phone] Route of submission (phone, fax, portal): PORTAL Phone # Fax # Auth type: Buy/Bill Units/visits requested: X2 DOSES Reference number: [id number] Approval from: 01/23/23 to 01/23/24

## 2023-01-30 DIAGNOSIS — K219 Gastro-esophageal reflux disease without esophagitis: Secondary | ICD-10-CM | POA: Diagnosis not present

## 2023-01-30 DIAGNOSIS — E509 Vitamin A deficiency, unspecified: Secondary | ICD-10-CM | POA: Diagnosis not present

## 2023-01-30 DIAGNOSIS — F17210 Nicotine dependence, cigarettes, uncomplicated: Secondary | ICD-10-CM | POA: Diagnosis not present

## 2023-01-30 DIAGNOSIS — Z7951 Long term (current) use of inhaled steroids: Secondary | ICD-10-CM | POA: Diagnosis not present

## 2023-01-30 DIAGNOSIS — F0394 Unspecified dementia, unspecified severity, with anxiety: Secondary | ICD-10-CM | POA: Diagnosis not present

## 2023-01-30 DIAGNOSIS — J9610 Chronic respiratory failure, unspecified whether with hypoxia or hypercapnia: Secondary | ICD-10-CM | POA: Diagnosis not present

## 2023-01-30 DIAGNOSIS — M810 Age-related osteoporosis without current pathological fracture: Secondary | ICD-10-CM | POA: Diagnosis not present

## 2023-01-30 DIAGNOSIS — Z791 Long term (current) use of non-steroidal anti-inflammatories (NSAID): Secondary | ICD-10-CM | POA: Diagnosis not present

## 2023-01-30 DIAGNOSIS — Z9181 History of falling: Secondary | ICD-10-CM | POA: Diagnosis not present

## 2023-01-30 DIAGNOSIS — J4489 Other specified chronic obstructive pulmonary disease: Secondary | ICD-10-CM | POA: Diagnosis not present

## 2023-01-30 DIAGNOSIS — K589 Irritable bowel syndrome without diarrhea: Secondary | ICD-10-CM | POA: Diagnosis not present

## 2023-01-30 DIAGNOSIS — E785 Hyperlipidemia, unspecified: Secondary | ICD-10-CM | POA: Diagnosis not present

## 2023-02-10 ENCOUNTER — Ambulatory Visit (INDEPENDENT_AMBULATORY_CARE_PROVIDER_SITE_OTHER): Payer: 59

## 2023-02-10 VITALS — BP 124/78 | HR 67 | Temp 98.0°F | Resp 18 | Ht 61.0 in | Wt 160.2 lb

## 2023-02-10 DIAGNOSIS — M810 Age-related osteoporosis without current pathological fracture: Secondary | ICD-10-CM | POA: Diagnosis not present

## 2023-02-10 MED ORDER — DENOSUMAB 60 MG/ML ~~LOC~~ SOSY
60.0000 mg | PREFILLED_SYRINGE | Freq: Once | SUBCUTANEOUS | Status: AC
  Administered 2023-02-10: 60 mg via SUBCUTANEOUS
  Filled 2023-02-10: qty 1

## 2023-02-10 NOTE — Progress Notes (Deleted)
Diagnosis: Osteoporosis  Provider:  Chilton Greathouse MD  Procedure: Injection  {Medications:25396}, {Dose:25397}, {Site:12696:::1}, Number of injections: {NUMBER 1-10:22536}  Post Care: {[facility] Post WGNF:62130}  Discharge: {Condition:19696:::1}, {Destination:18313::"Home":1} . {CHINFAVS:28985}  Performed by:  Adriana Mccallum, RN

## 2023-02-10 NOTE — Progress Notes (Signed)
Diagnosis: Osteoporosis  Provider:  Mannam, Praveen MD  Procedure: Injection  Prolia (Denosumab), Dose: 60 mg, Site: subcutaneous, Number of injections: 1  Post Care: Patient declined observation  Discharge: Condition: Good, Destination: Home . AVS Declined  Performed by:  Ohana Birdwell, RN       

## 2023-03-17 ENCOUNTER — Other Ambulatory Visit: Payer: Self-pay | Admitting: Emergency Medicine

## 2023-04-03 ENCOUNTER — Encounter (HOSPITAL_COMMUNITY): Payer: Self-pay

## 2023-04-03 ENCOUNTER — Other Ambulatory Visit: Payer: Self-pay

## 2023-04-03 ENCOUNTER — Emergency Department (HOSPITAL_COMMUNITY): Payer: 59

## 2023-04-03 ENCOUNTER — Emergency Department (HOSPITAL_COMMUNITY)
Admission: EM | Admit: 2023-04-03 | Discharge: 2023-04-04 | Disposition: A | Discharge status: Home / Self Care | Payer: 59 | Attending: Emergency Medicine | Admitting: Emergency Medicine

## 2023-04-03 DIAGNOSIS — J449 Chronic obstructive pulmonary disease, unspecified: Secondary | ICD-10-CM | POA: Diagnosis not present

## 2023-04-03 DIAGNOSIS — R42 Dizziness and giddiness: Secondary | ICD-10-CM | POA: Diagnosis not present

## 2023-04-03 DIAGNOSIS — W19XXXA Unspecified fall, initial encounter: Secondary | ICD-10-CM | POA: Diagnosis not present

## 2023-04-03 DIAGNOSIS — W01198A Fall on same level from slipping, tripping and stumbling with subsequent striking against other object, initial encounter: Secondary | ICD-10-CM | POA: Diagnosis not present

## 2023-04-03 DIAGNOSIS — R404 Transient alteration of awareness: Secondary | ICD-10-CM | POA: Diagnosis not present

## 2023-04-03 DIAGNOSIS — Z7951 Long term (current) use of inhaled steroids: Secondary | ICD-10-CM | POA: Diagnosis not present

## 2023-04-03 DIAGNOSIS — S199XXA Unspecified injury of neck, initial encounter: Secondary | ICD-10-CM | POA: Diagnosis not present

## 2023-04-03 DIAGNOSIS — R6889 Other general symptoms and signs: Secondary | ICD-10-CM | POA: Diagnosis not present

## 2023-04-03 DIAGNOSIS — S0990XA Unspecified injury of head, initial encounter: Secondary | ICD-10-CM | POA: Insufficient documentation

## 2023-04-03 DIAGNOSIS — I499 Cardiac arrhythmia, unspecified: Secondary | ICD-10-CM | POA: Diagnosis not present

## 2023-04-03 DIAGNOSIS — S0093XA Contusion of unspecified part of head, initial encounter: Secondary | ICD-10-CM | POA: Diagnosis not present

## 2023-04-03 NOTE — Discharge Instructions (Signed)
Take Tylenol for pain.  follow-up with your doctor.

## 2023-04-03 NOTE — ED Triage Notes (Signed)
BIB EMS from [facility] for fall that occurred today. Pt got dizzy and fell. Pt has small hematoma to left side of head and a small laceration. No use of blood thinners. No LOC.

## 2023-04-03 NOTE — ED Notes (Signed)
[facility] called for pt transport. 

## 2023-04-03 NOTE — ED Triage Notes (Signed)
Pt did have some wheezing when EMS arrived so they administered a breathing treatment and gave 125mg  of solumedrol.

## 2023-04-03 NOTE — ED Provider Notes (Signed)
[place] EMERGENCY DEPARTMENT AT [facility] Provider Note   CSN: [id number] Arrival date & time: 04/03/23  2123     History  Chief Complaint  Patient presents with   Fall   Dizziness    Senait Kuc is a 77 y.o. female.  Pt fell and hit her head.  No loc,   pt has a history of copd.  The history is provided by the patient and medical records.  Fall This is a new problem. The current episode started 12 to 24 hours ago. The problem occurs rarely. The problem has been resolved. Pertinent negatives include no chest pain, no abdominal pain and no headaches. Nothing aggravates the symptoms. Nothing relieves the symptoms. She has tried nothing for the symptoms.  Dizziness Associated symptoms: no chest pain, no diarrhea and no headaches        Home Medications Prior to Admission medications   Medication Sig Start Date End Date Taking? Authorizing Provider  acetaminophen (TYLENOL) 325 MG tablet Take 2 tablets (650 mg total) by mouth every 6 (six) hours as needed for mild pain (or Fever >/= 101). 11/27/22   Hollice Espy, MD  albuterol (PROAIR HFA) 108 (90 Base) MCG/ACT inhaler Inhale 2 puffs into the lungs every 6 (six) hours as needed for wheezing or shortness of breath. Patient taking differently: Inhale 2 puffs into the lungs 3 (three) times daily. 07/10/21   Leslye Peer, MD  albuterol (PROVENTIL) (2.5 MG/3ML) 0.083% nebulizer solution Take 3 mLs (2.5 mg total) by nebulization every 6 (six) hours as needed for wheezing or shortness of breath. 11/27/22   Hollice Espy, MD  busPIRone (BUSPAR) 5 MG tablet Take 5 mg by mouth 3 (three) times daily.    [provider]  celecoxib (CELEBREX) 100 MG capsule Take 100 mg by mouth daily.    [provider]  donepezil (ARICEPT) 10 MG tablet Take 10 mg by mouth daily.    [provider]  escitalopram (LEXAPRO) 10 MG tablet Take 10 mg by mouth daily.    [provider]  folic acid  (FOLVITE) 800 MCG tablet Take 800 mcg by mouth daily.    [provider]  memantine (NAMENDA) 10 MG tablet Take 1 tablet (10 mg total) by mouth 2 (two) times daily. 11/27/22   Hollice Espy, MD  montelukast (SINGULAIR) 10 MG tablet TAKE ONE TABLET BY MOUTH EVERY MORNING Patient taking differently: Take 10 mg by mouth at bedtime. 10/28/22   Leslye Peer, MD  polyethylene glycol (MIRALAX / GLYCOLAX) 17 g packet Take 17 g by mouth daily as needed for mild constipation. 11/27/22   Hollice Espy, MD  pravastatin (PRAVACHOL) 10 MG tablet Take 10 mg by mouth at bedtime.    [provider]  traZODone (DESYREL) 100 MG tablet Take 1 tablet (100 mg total) by mouth at bedtime. 11/27/22   Hollice Espy, MD  TRELEGY ELLIPTA 100-62.5-25 MCG/ACT AEPB INHALE ONE PUFF BY MOUTH ONE TIME DAILY 03/17/23   Leslye Peer, MD      Allergies    Codeine, Neurontin [gabapentin], and Tramadol    Review of Systems   Review of Systems  Constitutional:  Negative for appetite change and fatigue.  HENT:  Negative for congestion, ear discharge and sinus pressure.   Eyes:  Negative for discharge.  Respiratory:  Negative for cough.   Cardiovascular:  Negative for chest pain.  Gastrointestinal:  Negative for abdominal pain and diarrhea.  Genitourinary:  Negative for frequency and hematuria.  Musculoskeletal:  Negative for back pain.  Skin:  Negative for rash.  Neurological:  Negative for seizures and headaches.  Psychiatric/Behavioral:  Negative for hallucinations.     Physical Exam Updated Vital Signs BP 117/61 (BP Location: Right Arm)   Pulse 86   Temp 98.6 F (37 C) (Oral)   Resp 16   Ht 5\' 1"  (1.549 m)   Wt 72.5 kg   SpO2 93%   BMI 30.20 kg/m  Physical Exam Vitals and nursing note reviewed.  Constitutional:      Appearance: She is well-developed.  HENT:     Head: Normocephalic.     Comments: Swelling to the top of her head    Nose: Nose normal.  Eyes:     General: No  scleral icterus.    Conjunctiva/sclera: Conjunctivae normal.  Neck:     Thyroid: No thyromegaly.  Cardiovascular:     Rate and Rhythm: Normal rate and regular rhythm.     Heart sounds: No murmur heard.    No friction rub. No gallop.  Pulmonary:     Breath sounds: No stridor. No wheezing or rales.  Chest:     Chest wall: No tenderness.  Abdominal:     General: There is no distension.     Tenderness: There is no abdominal tenderness. There is no rebound.  Musculoskeletal:        General: Normal range of motion.     Cervical back: Neck supple.  Lymphadenopathy:     Cervical: No cervical adenopathy.  Skin:    Findings: No erythema or rash.  Neurological:     Mental Status: She is alert and oriented to person, place, and time.     Motor: No abnormal muscle tone.     Coordination: Coordination normal.  Psychiatric:        Behavior: Behavior normal.     ED Results / Procedures / Treatments   Labs (all labs ordered are listed, but only abnormal results are displayed) Labs Reviewed - No data to display  EKG None  Radiology CT Head Wo Contrast  Result Date: 04/03/2023 CLINICAL DATA:  Head trauma, intracranial arterial injury suspected; Neck trauma (Age >= 65y) Pt got dizzy and fell. Pt has small hematoma to left side of head and a small laceration. No use of blood thinners. No LOC EXAM: CT HEAD WITHOUT CONTRAST CT CERVICAL SPINE WITHOUT CONTRAST TECHNIQUE: Multidetector CT imaging of the head and cervical spine was performed following the standard protocol without intravenous contrast. Multiplanar CT image reconstructions of the cervical spine were also generated. RADIATION DOSE REDUCTION: This exam was performed according to the departmental dose-optimization program which includes automated exposure control, adjustment of the mA and/or kV according to patient size and/or use of iterative reconstruction technique. COMPARISON:  MRI head 11/26/2022, CT head and C-spine 11/25/2022  FINDINGS: CT HEAD FINDINGS Brain: Cerebral ventricle sizes are concordant with the degree of cerebral volume loss. Patchy and confluent areas of decreased attenuation are noted throughout the deep and periventricular white matter of the cerebral hemispheres bilaterally, compatible with chronic microvascular ischemic disease. No evidence of large-territorial acute infarction. No parenchymal hemorrhage. No mass lesion. No extra-axial collection. No mass effect or midline shift. No hydrocephalus. Basilar cisterns are patent. Vascular: No hyperdense vessel. Atherosclerotic calcifications are present within the cavernous internal carotid and vertebral arteries. Skull: No acute fracture or focal lesion. Sinuses/Orbits: Paranasal sinuses and mastoid air cells are clear. Bilateral lens replacement. Otherwise the orbits  are unremarkable. Other: None. CT CERVICAL SPINE FINDINGS Alignment: Normal. Skull base and vertebrae: Multilevel degenerative changes spine with associated at least moderate bilateral C4-C5 and C5-C6 osseous neural foraminal stenosis. No acute fracture. No aggressive appearing focal osseous lesion or focal pathologic process. Soft tissues and spinal canal: No prevertebral fluid or swelling. No visible canal hematoma. Upper chest: Unremarkable. Other: None. IMPRESSION: 1. No acute intracranial abnormality. 2. No acute displaced fracture or traumatic listhesis of the cervical spine. 3. ultilevel degenerative changes spine with associated at least moderate bilateral C4-C5 and C5-C6 osseous neural foraminal stenosis. Electronically Signed   By: Tish Frederickson M.D.   On: 04/03/2023 22:19   CT Cervical Spine Wo Contrast  Result Date: 04/03/2023 CLINICAL DATA:  Head trauma, intracranial arterial injury suspected; Neck trauma (Age >= 65y) Pt got dizzy and fell. Pt has small hematoma to left side of head and a small laceration. No use of blood thinners. No LOC EXAM: CT HEAD WITHOUT CONTRAST CT CERVICAL SPINE  WITHOUT CONTRAST TECHNIQUE: Multidetector CT imaging of the head and cervical spine was performed following the standard protocol without intravenous contrast. Multiplanar CT image reconstructions of the cervical spine were also generated. RADIATION DOSE REDUCTION: This exam was performed according to the departmental dose-optimization program which includes automated exposure control, adjustment of the mA and/or kV according to patient size and/or use of iterative reconstruction technique. COMPARISON:  MRI head 11/26/2022, CT head and C-spine 11/25/2022 FINDINGS: CT HEAD FINDINGS Brain: Cerebral ventricle sizes are concordant with the degree of cerebral volume loss. Patchy and confluent areas of decreased attenuation are noted throughout the deep and periventricular white matter of the cerebral hemispheres bilaterally, compatible with chronic microvascular ischemic disease. No evidence of large-territorial acute infarction. No parenchymal hemorrhage. No mass lesion. No extra-axial collection. No mass effect or midline shift. No hydrocephalus. Basilar cisterns are patent. Vascular: No hyperdense vessel. Atherosclerotic calcifications are present within the cavernous internal carotid and vertebral arteries. Skull: No acute fracture or focal lesion. Sinuses/Orbits: Paranasal sinuses and mastoid air cells are clear. Bilateral lens replacement. Otherwise the orbits are unremarkable. Other: None. CT CERVICAL SPINE FINDINGS Alignment: Normal. Skull base and vertebrae: Multilevel degenerative changes spine with associated at least moderate bilateral C4-C5 and C5-C6 osseous neural foraminal stenosis. No acute fracture. No aggressive appearing focal osseous lesion or focal pathologic process. Soft tissues and spinal canal: No prevertebral fluid or swelling. No visible canal hematoma. Upper chest: Unremarkable. Other: None. IMPRESSION: 1. No acute intracranial abnormality. 2. No acute displaced fracture or traumatic listhesis  of the cervical spine. 3. ultilevel degenerative changes spine with associated at least moderate bilateral C4-C5 and C5-C6 osseous neural foraminal stenosis. Electronically Signed   By: Tish Frederickson M.D.   On: 04/03/2023 22:19    Procedures Procedures    Medications Ordered in ED Medications - No data to display  ED Course/ Medical Decision Making/ A&P                                 Medical Decision Making Amount and/or Complexity of Data Reviewed Radiology: ordered.  This patient presents to the ED for concern of fall, this involves an extensive number of treatment options, and is a complaint that carries with it a high risk of complications and morbidity.  The differential diagnosis includes head injury   Co morbidities that complicate the patient evaluation  COPD   Additional history obtained:  Additional history obtained  from patient External records from outside source obtained and reviewed including hospital records   Lab Tests:  No labs Imaging Studies ordered:  I ordered imaging studies including CT head and cervical spine I independently visualized and interpreted imaging which showed negative I agree with the radiologist interpretation   Cardiac Monitoring: / EKG:  The patient was maintained on a cardiac monitor.  I personally viewed and interpreted the cardiac monitored which showed an underlying rhythm of: Normal sinus rhythm   Consultations Obtained:  No consultant  Problem List / ED Course / Critical interventions / Medication management  COPD and fall No medicines given Reevaluation of the patient after these medicines showed that the patient stayed the same I have reviewed the patients home medicines and have made adjustments as needed   Social Determinants of Health:  None   Test / Admission - Considered:  None  Patient with a fall and minor head injury.  No loss of consciousness.  CT of head negative        Final  Clinical Impression(s) / ED Diagnoses Final diagnoses:  Fall in home, initial encounter  Injury of head, initial encounter    Rx / DC Orders ED Discharge Orders     None         Bethann Berkshire, MD 04/04/23 1216

## 2023-04-04 DIAGNOSIS — R404 Transient alteration of awareness: Secondary | ICD-10-CM | POA: Diagnosis not present

## 2023-04-04 DIAGNOSIS — Z743 Need for continuous supervision: Secondary | ICD-10-CM | POA: Diagnosis not present

## 2023-04-04 NOTE — ED Notes (Signed)
Pt left with [facility] to be transferred back to [facility]. Report called by previous nurse

## 2023-04-06 DIAGNOSIS — E785 Hyperlipidemia, unspecified: Secondary | ICD-10-CM | POA: Diagnosis not present

## 2023-04-06 DIAGNOSIS — S0083XD Contusion of other part of head, subsequent encounter: Secondary | ICD-10-CM | POA: Diagnosis not present

## 2023-04-06 DIAGNOSIS — J449 Chronic obstructive pulmonary disease, unspecified: Secondary | ICD-10-CM | POA: Diagnosis not present

## 2023-04-06 DIAGNOSIS — R296 Repeated falls: Secondary | ICD-10-CM | POA: Diagnosis not present

## 2023-04-26 ENCOUNTER — Other Ambulatory Visit: Payer: Self-pay | Admitting: Emergency Medicine

## 2023-05-25 DIAGNOSIS — E785 Hyperlipidemia, unspecified: Secondary | ICD-10-CM | POA: Diagnosis not present

## 2023-06-03 ENCOUNTER — Other Ambulatory Visit: Payer: Self-pay | Admitting: Emergency Medicine

## 2023-06-19 DIAGNOSIS — M25511 Pain in right shoulder: Secondary | ICD-10-CM | POA: Diagnosis not present

## 2023-07-03 ENCOUNTER — Ambulatory Visit (INDEPENDENT_AMBULATORY_CARE_PROVIDER_SITE_OTHER): Payer: 59 | Admitting: Emergency Medicine

## 2023-07-03 ENCOUNTER — Encounter: Payer: Self-pay | Admitting: Emergency Medicine

## 2023-07-03 VITALS — BP 112/68 | HR 63 | Ht 61.0 in | Wt 149.0 lb

## 2023-07-03 DIAGNOSIS — Z23 Encounter for immunization: Secondary | ICD-10-CM

## 2023-07-03 DIAGNOSIS — J449 Chronic obstructive pulmonary disease, unspecified: Secondary | ICD-10-CM

## 2023-07-03 DIAGNOSIS — J9611 Chronic respiratory failure with hypoxia: Secondary | ICD-10-CM

## 2023-07-03 DIAGNOSIS — J301 Allergic rhinitis due to pollen: Secondary | ICD-10-CM

## 2023-07-03 MED ORDER — MONTELUKAST SODIUM 10 MG PO TABS: 10.0000 mg | ORAL_TABLET | Freq: Every morning | ORAL | 3 refills | Status: DC

## 2023-07-03 MED ORDER — TRELEGY ELLIPTA 100-62.5-25 MCG/ACT IN AEPB: 1.0000 | INHALATION_SPRAY | Freq: Every day | RESPIRATORY_TRACT | 11 refills | Status: DC

## 2023-07-03 NOTE — Assessment & Plan Note (Signed)
Overall stable functional capacity, she is still quite limited by her COPD.  Her daughter gives most of the history.  No flares or antibiotics/prednisone since I last saw her 2 years ago.  Plan to continue Trelegy, albuterol as needed which she rarely uses.  She needs a flu shot today.

## 2023-07-03 NOTE — Assessment & Plan Note (Signed)
Continue Singulair.

## 2023-07-03 NOTE — Progress Notes (Signed)
Subjective:    Patient ID: Kathy Skinner, female    DOB: [birth year], 77 y.o.   MRN: [id number]  HPI  ROV 07/10/21 --follow-up visit 77 year old woman with history of tobacco use, very severe COPD.  She has associated chronic hypoxemic respiratory failure but has not wanted to be started on supplemental oxygen.  She has been managed on Trelegy.  She uses albuterol very rarely.  Singulair once daily, Prilosec 20 mg once daily Reports that her breathing has been stable with her usual sedentary activity. She will get sob with longer walks, when going out. Can walk about 200 ft but then needs to stop to rest. Uses an electric cart to go farther. Daily cough, minimally productive. Treated for an AE a few months ago in setting URI. That was the only flare since last time.   ROV 07/03/2023 --Garry is 38 with a history of former tobacco use and very severe COPD, associated chronic hypoxemic respiratory failure.  She has wanted to avoid being started on supplemental oxygen.  She also has GERD, allergic rhinitis.  Last seen here 2 years ago. She is able to walk, does get SOB with extended walk or heavier activity. Some random cough, a few times a day. No sputum. Her daughter has seen some desats with exertion to 88% but then quick recovery. No albuterol use. No flares, no abx, no pred. She has had 2 falls since I last saw her. Remains on trelegy, singulair.   Chest x-ray 11/25/2022 reviewed by me showed borderline enlargement of the cardiac silhouette, remote left anterior sixth rib fracture, no infiltrates but blunting of the costophrenic angles       No data to display           Review of Systems  Constitutional:  Negative for fever and unexpected weight change.  HENT:  Positive for congestion. Negative for dental problem, ear pain, nosebleeds, postnasal drip, rhinorrhea, sinus pressure, sneezing, sore throat and trouble swallowing.   Eyes:  Negative for redness, itching and visual disturbance.   Respiratory:  Positive for cough and shortness of breath. Negative for chest tightness and wheezing.   Cardiovascular:  Negative for palpitations and leg swelling.  Gastrointestinal:  Negative for nausea and vomiting.  Genitourinary:  Negative for dysuria.  Musculoskeletal:  Negative for joint swelling.  Skin:  Negative for rash.  Neurological:  Negative for dizziness and headaches.  Hematological:  Does not bruise/bleed easily.  Psychiatric/Behavioral:  Negative for dysphoric mood. The patient is not nervous/anxious.        Objective:   Physical Exam Vitals:   07/03/23 1030  BP: 112/68  Pulse: 63  SpO2: 93%  Weight: 149 lb (67.6 kg)  Height: 5\' 1"  (1.549 m)    Gen: Pleasant, overwt, in no distress,  normal affect  ENT: No lesions,  mouth clear,  oropharynx clear, no postnasal drip  Neck: No JVD, no stridor  Lungs: No use of accessory muscles, distant but no wheezing on a normal breath or on her forced expiration  Cardiovascular: RRR, heart sounds normal, no murmur or gallops, no peripheral edema  Musculoskeletal: No deformities, no cyanosis or clubbing  Neuro: alert, awake.  Poor memory and poor history giving.  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  Her daughter gives most of the history.  No flares  or antibiotics/prednisone since I last saw her 2 years ago.  Plan to continue Trelegy, albuterol as needed which she rarely uses.  She needs a flu shot today.  Chronic respiratory failure (HCC) She has had intermittent desaturations, has not wanted oxygen.  On some occasions when we have tested her she has not desaturated on our walking testing and thus has not qualified.  On other she has qualified but did not want to pursue it.  We will hold off on oxygen for now.  Allergic rhinitis Continue  Singulair   Levy Pupa, MD, PhD 07/03/2023, 11:08 AM [facility] [phone] or if no answer [phone]

## 2023-07-03 NOTE — Addendum Note (Signed)
Addended by: Christen Butter on: 07/03/2023 11:20 AM   Modules accepted: Orders

## 2023-07-03 NOTE — Patient Instructions (Signed)
Please continue Trelegy 1 elation once daily.  Rinse and gargle after using. Keep your albuterol available to use 2 puffs when needed for shortness of breath, chest tightness, wheezing. Flu shot today Follow with Dr. Delton Coombes in 1 year, sooner if you have any problems.

## 2023-07-03 NOTE — Assessment & Plan Note (Signed)
She has had intermittent desaturations, has not wanted oxygen.  On some occasions when we have tested her she has not desaturated on our walking testing and thus has not qualified.  On other she has qualified but did not want to pursue it.  We will hold off on oxygen for now.

## 2023-07-06 ENCOUNTER — Other Ambulatory Visit: Payer: Self-pay | Admitting: Emergency Medicine

## 2023-07-14 ENCOUNTER — Other Ambulatory Visit: Payer: Self-pay | Admitting: Emergency Medicine

## 2023-08-03 ENCOUNTER — Other Ambulatory Visit: Payer: Self-pay | Admitting: Emergency Medicine

## 2023-08-10 DIAGNOSIS — J449 Chronic obstructive pulmonary disease, unspecified: Secondary | ICD-10-CM | POA: Diagnosis not present

## 2023-08-10 DIAGNOSIS — S51811A Laceration without foreign body of right forearm, initial encounter: Secondary | ICD-10-CM | POA: Diagnosis not present

## 2023-08-13 ENCOUNTER — Ambulatory Visit: Payer: 59 | Admitting: *Deleted

## 2023-08-13 VITALS — BP 112/75 | HR 80 | Temp 97.5°F | Resp 18 | Ht 61.0 in | Wt 147.4 lb

## 2023-08-13 DIAGNOSIS — M810 Age-related osteoporosis without current pathological fracture: Secondary | ICD-10-CM

## 2023-08-13 MED ORDER — DENOSUMAB 60 MG/ML ~~LOC~~ SOSY
60.0000 mg | PREFILLED_SYRINGE | Freq: Once | SUBCUTANEOUS | Status: AC
  Administered 2023-08-13: 60 mg via SUBCUTANEOUS
  Filled 2023-08-13: qty 1

## 2023-08-13 NOTE — Progress Notes (Signed)
Diagnosis: Osteoporosis  Provider:  Chilton Greathouse MD  Procedure: Injection  Prolia (Denosumab), Dose: 60 mg, Site: subcutaneous, Number of injections: 1  Injection Site(s): Right lower quad. abdomne  Post Care: Observation period completed  Discharge: Condition: Good, Destination: Home . AVS Provided  Performed by:  Forrest Moron, RN

## 2023-08-30 ENCOUNTER — Other Ambulatory Visit: Payer: Self-pay | Admitting: Emergency Medicine

## 2023-09-14 DIAGNOSIS — R451 Restlessness and agitation: Secondary | ICD-10-CM | POA: Diagnosis not present

## 2023-09-14 DIAGNOSIS — Z0001 Encounter for general adult medical examination with abnormal findings: Secondary | ICD-10-CM | POA: Diagnosis not present

## 2023-09-21 ENCOUNTER — Other Ambulatory Visit: Payer: Self-pay | Admitting: Emergency Medicine

## 2023-10-12 ENCOUNTER — Other Ambulatory Visit: Payer: Self-pay

## 2023-10-12 ENCOUNTER — Inpatient Hospital Stay (HOSPITAL_COMMUNITY)
Admission: EM | Admit: 2023-10-12 | Discharge: 2023-10-29 | DRG: 189 | Disposition: A | Discharge status: Skilled Nursing Facility | Payer: 59 | Source: Skilled Nursing Facility | Attending: Family Medicine | Admitting: Family Medicine

## 2023-10-12 ENCOUNTER — Emergency Department (HOSPITAL_COMMUNITY): Payer: 59

## 2023-10-12 ENCOUNTER — Encounter (HOSPITAL_COMMUNITY): Payer: Self-pay

## 2023-10-12 DIAGNOSIS — Z79899 Other long term (current) drug therapy: Secondary | ICD-10-CM | POA: Diagnosis not present

## 2023-10-12 DIAGNOSIS — F329 Major depressive disorder, single episode, unspecified: Secondary | ICD-10-CM | POA: Diagnosis present

## 2023-10-12 DIAGNOSIS — J9601 Acute respiratory failure with hypoxia: Secondary | ICD-10-CM | POA: Diagnosis not present

## 2023-10-12 DIAGNOSIS — E876 Hypokalemia: Secondary | ICD-10-CM | POA: Diagnosis present

## 2023-10-12 DIAGNOSIS — F03A0 Unspecified dementia, mild, without behavioral disturbance, psychotic disturbance, mood disturbance, and anxiety: Secondary | ICD-10-CM | POA: Diagnosis not present

## 2023-10-12 DIAGNOSIS — Z87891 Personal history of nicotine dependence: Secondary | ICD-10-CM

## 2023-10-12 DIAGNOSIS — Z7951 Long term (current) use of inhaled steroids: Secondary | ICD-10-CM

## 2023-10-12 DIAGNOSIS — R531 Weakness: Secondary | ICD-10-CM | POA: Diagnosis not present

## 2023-10-12 DIAGNOSIS — R627 Adult failure to thrive: Secondary | ICD-10-CM | POA: Diagnosis present

## 2023-10-12 DIAGNOSIS — R64 Cachexia: Secondary | ICD-10-CM | POA: Diagnosis not present

## 2023-10-12 DIAGNOSIS — Z720 Tobacco use: Secondary | ICD-10-CM | POA: Diagnosis present

## 2023-10-12 DIAGNOSIS — F0390 Unspecified dementia without behavioral disturbance: Secondary | ICD-10-CM | POA: Diagnosis present

## 2023-10-12 DIAGNOSIS — M810 Age-related osteoporosis without current pathological fracture: Secondary | ICD-10-CM | POA: Diagnosis present

## 2023-10-12 DIAGNOSIS — J441 Chronic obstructive pulmonary disease with (acute) exacerbation: Secondary | ICD-10-CM | POA: Diagnosis not present

## 2023-10-12 DIAGNOSIS — R0609 Other forms of dyspnea: Secondary | ICD-10-CM | POA: Diagnosis not present

## 2023-10-12 DIAGNOSIS — J440 Chronic obstructive pulmonary disease with acute lower respiratory infection: Secondary | ICD-10-CM | POA: Diagnosis not present

## 2023-10-12 DIAGNOSIS — K219 Gastro-esophageal reflux disease without esophagitis: Secondary | ICD-10-CM | POA: Diagnosis not present

## 2023-10-12 DIAGNOSIS — J9811 Atelectasis: Secondary | ICD-10-CM | POA: Diagnosis not present

## 2023-10-12 DIAGNOSIS — Z743 Need for continuous supervision: Secondary | ICD-10-CM | POA: Diagnosis not present

## 2023-10-12 DIAGNOSIS — R059 Cough, unspecified: Secondary | ICD-10-CM | POA: Diagnosis not present

## 2023-10-12 DIAGNOSIS — Z888 Allergy status to other drugs, medicaments and biological substances status: Secondary | ICD-10-CM

## 2023-10-12 DIAGNOSIS — R6889 Other general symptoms and signs: Secondary | ICD-10-CM | POA: Diagnosis not present

## 2023-10-12 DIAGNOSIS — Z6827 Body mass index (BMI) 27.0-27.9, adult: Secondary | ICD-10-CM

## 2023-10-12 DIAGNOSIS — E119 Type 2 diabetes mellitus without complications: Secondary | ICD-10-CM | POA: Diagnosis present

## 2023-10-12 DIAGNOSIS — S069XAA Unspecified intracranial injury with loss of consciousness status unknown, initial encounter: Secondary | ICD-10-CM | POA: Diagnosis present

## 2023-10-12 DIAGNOSIS — R339 Retention of urine, unspecified: Secondary | ICD-10-CM | POA: Diagnosis not present

## 2023-10-12 DIAGNOSIS — E43 Unspecified severe protein-calorie malnutrition: Secondary | ICD-10-CM | POA: Insufficient documentation

## 2023-10-12 DIAGNOSIS — J439 Emphysema, unspecified: Secondary | ICD-10-CM | POA: Diagnosis present

## 2023-10-12 DIAGNOSIS — R0602 Shortness of breath: Secondary | ICD-10-CM | POA: Diagnosis not present

## 2023-10-12 DIAGNOSIS — Z1152 Encounter for screening for COVID-19: Secondary | ICD-10-CM | POA: Diagnosis not present

## 2023-10-12 DIAGNOSIS — Z751 Person awaiting admission to adequate facility elsewhere: Secondary | ICD-10-CM

## 2023-10-12 DIAGNOSIS — F411 Generalized anxiety disorder: Secondary | ICD-10-CM | POA: Diagnosis present

## 2023-10-12 DIAGNOSIS — I5033 Acute on chronic diastolic (congestive) heart failure: Secondary | ICD-10-CM | POA: Diagnosis not present

## 2023-10-12 DIAGNOSIS — E785 Hyperlipidemia, unspecified: Secondary | ICD-10-CM | POA: Diagnosis not present

## 2023-10-12 DIAGNOSIS — Z66 Do not resuscitate: Secondary | ICD-10-CM | POA: Diagnosis not present

## 2023-10-12 DIAGNOSIS — I700 Atherosclerosis of aorta: Secondary | ICD-10-CM | POA: Diagnosis present

## 2023-10-12 DIAGNOSIS — F410 Panic disorder [episodic paroxysmal anxiety] without agoraphobia: Secondary | ICD-10-CM | POA: Diagnosis present

## 2023-10-12 DIAGNOSIS — J9621 Acute and chronic respiratory failure with hypoxia: Principal | ICD-10-CM | POA: Diagnosis present

## 2023-10-12 DIAGNOSIS — I517 Cardiomegaly: Secondary | ICD-10-CM | POA: Diagnosis not present

## 2023-10-12 DIAGNOSIS — Z885 Allergy status to narcotic agent status: Secondary | ICD-10-CM

## 2023-10-12 DIAGNOSIS — J189 Pneumonia, unspecified organism: Secondary | ICD-10-CM | POA: Diagnosis present

## 2023-10-12 DIAGNOSIS — R0902 Hypoxemia: Secondary | ICD-10-CM | POA: Diagnosis not present

## 2023-10-12 DIAGNOSIS — F0393 Unspecified dementia, unspecified severity, with mood disturbance: Secondary | ICD-10-CM | POA: Diagnosis present

## 2023-10-12 DIAGNOSIS — F0394 Unspecified dementia, unspecified severity, with anxiety: Secondary | ICD-10-CM | POA: Diagnosis present

## 2023-10-12 LAB — MAGNESIUM: Magnesium: 2.9 mg/dL — ABNORMAL HIGH (ref 1.7–2.4)

## 2023-10-12 LAB — CBC WITH DIFFERENTIAL/PLATELET
Abs Immature Granulocytes: 0.02 10*3/uL (ref 0.00–0.07)
Basophils Absolute: 0 10*3/uL (ref 0.0–0.1)
Basophils Relative: 1 %
Eosinophils Absolute: 0 10*3/uL (ref 0.0–0.5)
Eosinophils Relative: 0 %
HCT: 46.2 % — ABNORMAL HIGH (ref 36.0–46.0)
Hemoglobin: 15.2 g/dL — ABNORMAL HIGH (ref 12.0–15.0)
Immature Granulocytes: 0 %
Lymphocytes Relative: 10 %
Lymphs Abs: 0.7 10*3/uL (ref 0.7–4.0)
MCH: 31.1 pg (ref 26.0–34.0)
MCHC: 32.9 g/dL (ref 30.0–36.0)
MCV: 94.7 fL (ref 80.0–100.0)
Monocytes Absolute: 0.8 10*3/uL (ref 0.1–1.0)
Monocytes Relative: 12 %
Neutro Abs: 4.9 10*3/uL (ref 1.7–7.7)
Neutrophils Relative %: 77 %
Platelets: 237 10*3/uL (ref 150–400)
RBC: 4.88 MIL/uL (ref 3.87–5.11)
RDW: 12.7 % (ref 11.5–15.5)
WBC: 6.3 10*3/uL (ref 4.0–10.5)
nRBC: 0 % (ref 0.0–0.2)

## 2023-10-12 LAB — RESPIRATORY PANEL BY PCR

## 2023-10-12 LAB — COMPREHENSIVE METABOLIC PANEL
ALT: 14 U/L (ref 0–44)
AST: 17 U/L (ref 15–41)
Albumin: 3 g/dL — ABNORMAL LOW (ref 3.5–5.0)
Alkaline Phosphatase: 78 U/L (ref 38–126)
Anion gap: 12 (ref 5–15)
BUN: 13 mg/dL (ref 8–23)
CO2: 29 mmol/L (ref 22–32)
Calcium: 7.9 mg/dL — ABNORMAL LOW (ref 8.9–10.3)
Chloride: 102 mmol/L (ref 98–111)
Creatinine, Ser: 0.47 mg/dL (ref 0.44–1.00)
GFR, Estimated: >60 mL/min (ref >60)
Glucose, Bld: 112 mg/dL — ABNORMAL HIGH (ref 70–99)
Potassium: 3.2 mmol/L — ABNORMAL LOW (ref 3.5–5.1)
Sodium: 143 mmol/L (ref 135–145)
Total Bilirubin: 1.2 mg/dL (ref 0.0–1.2)
Total Protein: 7.8 g/dL (ref 6.5–8.1)

## 2023-10-12 LAB — RESP PANEL BY RT-PCR (RSV, FLU A&B, COVID)  RVPGX2
Influenza A by PCR: NEGATIVE
Influenza B by PCR: NEGATIVE
Resp Syncytial Virus by PCR: NEGATIVE
SARS Coronavirus 2 by RT PCR: NEGATIVE

## 2023-10-12 LAB — BRAIN NATRIURETIC PEPTIDE: B Natriuretic Peptide: 129.1 pg/mL — ABNORMAL HIGH (ref 0.0–100.0)

## 2023-10-12 MED ORDER — PREDNISONE 20 MG PO TABS
40.0000 mg | ORAL_TABLET | Freq: Every day | ORAL | Status: DC
  Administered 2023-10-13 – 2023-10-14 (×2): 40 mg via ORAL
  Filled 2023-10-12 (×2): qty 2

## 2023-10-12 MED ORDER — ALBUTEROL SULFATE (2.5 MG/3ML) 0.083% IN NEBU: 2.5000 mg | INHALATION_SOLUTION | RESPIRATORY_TRACT | Status: DC | PRN

## 2023-10-12 MED ORDER — DONEPEZIL HCL 10 MG PO TABS
5.0000 mg | ORAL_TABLET | Freq: Every day | ORAL | Status: DC
Start: 2023-10-12 — End: 2023-10-29
  Administered 2023-10-12 – 2023-10-28 (×17): 5 mg via ORAL
  Filled 2023-10-12 (×17): qty 1

## 2023-10-12 MED ORDER — MONTELUKAST SODIUM 10 MG PO TABS
10.0000 mg | ORAL_TABLET | Freq: Every morning | ORAL | Status: DC
  Administered 2023-10-13 – 2023-10-29 (×17): 10 mg via ORAL
  Filled 2023-10-12 (×17): qty 1

## 2023-10-12 MED ORDER — ENOXAPARIN SODIUM 40 MG/0.4ML IJ SOSY
40.0000 mg | PREFILLED_SYRINGE | INTRAMUSCULAR | Status: DC
  Administered 2023-10-12 – 2023-10-28 (×17): 40 mg via SUBCUTANEOUS
  Filled 2023-10-12 (×17): qty 0.4

## 2023-10-12 MED ORDER — POTASSIUM CHLORIDE CRYS ER 20 MEQ PO TBCR: 40.0000 meq | EXTENDED_RELEASE_TABLET | Freq: Once | ORAL | Status: DC

## 2023-10-12 MED ORDER — IPRATROPIUM-ALBUTEROL 0.5-2.5 (3) MG/3ML IN SOLN
3.0000 mL | Freq: Four times a day (QID) | RESPIRATORY_TRACT | Status: DC
  Administered 2023-10-12 – 2023-10-13 (×3): 3 mL via RESPIRATORY_TRACT
  Filled 2023-10-12 (×3): qty 3

## 2023-10-12 MED ORDER — FOLIC ACID 1 MG PO TABS
1.0000 mg | ORAL_TABLET | Freq: Every day | ORAL | Status: DC
  Administered 2023-10-13 – 2023-10-29 (×17): 1 mg via ORAL
  Filled 2023-10-12 (×17): qty 1

## 2023-10-12 MED ORDER — ESCITALOPRAM OXALATE 20 MG PO TABS
10.0000 mg | ORAL_TABLET | Freq: Every day | ORAL | Status: DC
  Administered 2023-10-13 – 2023-10-29 (×17): 10 mg via ORAL
  Filled 2023-10-12 (×17): qty 1

## 2023-10-12 MED ORDER — ONDANSETRON HCL 4 MG PO TABS
4.0000 mg | ORAL_TABLET | Freq: Four times a day (QID) | ORAL | Status: DC | PRN
Start: 2023-10-12 — End: 2023-10-29

## 2023-10-12 MED ORDER — IPRATROPIUM-ALBUTEROL 0.5-2.5 (3) MG/3ML IN SOLN
3.0000 mL | Freq: Once | RESPIRATORY_TRACT | Status: AC
  Administered 2023-10-12: 3 mL via RESPIRATORY_TRACT
  Filled 2023-10-12: qty 3

## 2023-10-12 MED ORDER — ACETAMINOPHEN 650 MG RE SUPP: 650.0000 mg | Freq: Four times a day (QID) | RECTAL | Status: DC | PRN

## 2023-10-12 MED ORDER — BUSPIRONE HCL 5 MG PO TABS
5.0000 mg | ORAL_TABLET | Freq: Three times a day (TID) | ORAL | Status: DC
  Administered 2023-10-12 – 2023-10-29 (×50): 5 mg via ORAL
  Filled 2023-10-12 (×51): qty 1

## 2023-10-12 MED ORDER — MEMANTINE HCL 10 MG PO TABS
10.0000 mg | ORAL_TABLET | Freq: Two times a day (BID) | ORAL | Status: DC
Start: 2023-10-12 — End: 2023-10-29
  Administered 2023-10-12 – 2023-10-29 (×34): 10 mg via ORAL
  Filled 2023-10-12 (×18): qty 1
  Filled 2023-10-12: qty 2
  Filled 2023-10-12: qty 1
  Filled 2023-10-12: qty 2
  Filled 2023-10-12 (×11): qty 1

## 2023-10-12 MED ORDER — PRAVASTATIN SODIUM 20 MG PO TABS
10.0000 mg | ORAL_TABLET | Freq: Every day | ORAL | Status: DC
Start: 2023-10-13 — End: 2023-10-29
  Administered 2023-10-13 – 2023-10-29 (×17): 10 mg via ORAL
  Filled 2023-10-12 (×17): qty 1

## 2023-10-12 MED ORDER — TRAZODONE HCL 100 MG PO TABS
100.0000 mg | ORAL_TABLET | Freq: Every day | ORAL | Status: DC
  Administered 2023-10-12 – 2023-10-28 (×17): 100 mg via ORAL
  Filled 2023-10-12 (×17): qty 1

## 2023-10-12 MED ORDER — METHYLPREDNISOLONE SODIUM SUCC 125 MG IJ SOLR
125.0000 mg | Freq: Once | INTRAMUSCULAR | Status: AC
  Administered 2023-10-12: 125 mg via INTRAVENOUS
  Filled 2023-10-12: qty 2

## 2023-10-12 MED ORDER — ACETAMINOPHEN 325 MG PO TABS
650.0000 mg | ORAL_TABLET | Freq: Four times a day (QID) | ORAL | Status: DC | PRN
  Administered 2023-10-16 – 2023-10-21 (×4): 650 mg via ORAL
  Filled 2023-10-12 (×5): qty 2

## 2023-10-12 MED ORDER — POLYETHYLENE GLYCOL 3350 17 G PO PACK: 17.0000 g | PACK | Freq: Every day | ORAL | Status: DC | PRN

## 2023-10-12 MED ORDER — ONDANSETRON HCL 4 MG/2ML IJ SOLN: 4.0000 mg | Freq: Four times a day (QID) | INTRAMUSCULAR | Status: DC | PRN

## 2023-10-12 NOTE — ED Notes (Signed)
 Daughter called and was given an update. She said she is POA and will not be able to visit right now because she has strep and Flu going around her house. She would like updates

## 2023-10-12 NOTE — H&P (Signed)
 History and Physical    Patient: Kathy Skinner YNW:[id number] DOB: 1946-05-02 DOA: 10/12/2023 DOS: the patient was seen and examined on 10/12/2023 PCP: Renetta Carter, MD (Inactive)  Patient coming from: SNF  Chief Complaint:  Chief Complaint  Patient presents with   Weakness   HPI: Kathy Skinner is a 78 y.o. female with medical history significant of anxiety, panic attacks, tobacco use, osteoporosis, hypersomnolence, asthma, COPD, chronic respiratory failure who presented to the emergency department due to generalized weakness and hypoxia in the 80s on room air.  She is unable to provide further history at this time, but is able to answer simple questions.  No headache, dyspnea, sore throat, chest, back or abdominal pain.  Multiple residents with respiratory illnesses at her facility.  She has been on Tamiflu 75 mg p.o. daily since a week ago.  Lab work: Coronavirus, influenza RSV PCR was negative.  CBC showed a white count 6.3, hemoglobin 15.2 g/dL and platelets 657.  BMP 129.1 pg/mL.  Magnesium was 2.9 mg/dL.  CMP showed a potassium of 3.2 mmol/L, glucose 112 mg/dL and albumin 3.0 g/dL.  After calcium correction the rest of the electrolytes, the rest of the hepatic functions and renal function was normal.  Imaging: Portable 1 view chest radiograph showing borderline cardiomegaly with no acute cardiopulmonary findings.   ED course: Initial vital signs were temperature 98.7 F, pulse 80, respiration 18, BP 129/73 mmHg O2 sat 98% on room air.  The patient received a DuoNeb and 125 mg of methylprednisolone .  I added KCl 40 mEq p.o. x 1.  Review of Systems: As mentioned in the history of present illness. All other systems reviewed and are negative. Past Medical History:  Diagnosis Date   Anxiety    Asthma    COPD (chronic obstructive pulmonary disease) (HCC)    Panic attacks    Past Surgical History:  Procedure Laterality Date   TUBAL LIGATION     Social History:  reports that she has  quit smoking. Her smoking use included cigarettes. She started smoking about 60 years ago. She has a 50 pack-year smoking history. She has never used smokeless tobacco. She reports that she does not drink alcohol and does not use drugs.  Allergies  Allergen Reactions   Neurontin [Gabapentin] Other (See Comments)    seizures   Tramadol  Swelling   Codeine Nausea Only    Family History  Problem Relation Age of Onset   Cancer Mother    Aneurysm Father     Prior to Admission medications   Medication Sig Start Date End Date Taking? Authorizing Provider  acetaminophen  (TYLENOL ) 325 MG tablet Take 2 tablets (650 mg total) by mouth every 6 (six) hours as needed for mild pain (or Fever >/= 101). 11/27/22  Yes Krishnan, Sendil K, MD  albuterol  (PROAIR  HFA) 108 (90 Base) MCG/ACT inhaler Inhale 2 puffs into the lungs every 6 (six) hours as needed for wheezing or shortness of breath. 07/10/21  Yes Denson Flake, MD  busPIRone  (BUSPAR ) 5 MG tablet Take 5 mg by mouth 3 (three) times daily.   Yes [provider]  donepezil  (ARICEPT ) 5 MG tablet Take 5 mg by mouth daily. 10/05/23  Yes [provider]  escitalopram  (LEXAPRO ) 10 MG tablet Take 10 mg by mouth daily.   Yes [provider]  Fluticasone -Umeclidin-Vilant (TRELEGY ELLIPTA ) 100-62.5-25 MCG/ACT AEPB Inhale 1 puff into the lungs daily. 07/03/23  Yes Denson Flake, MD  folic acid  (FOLVITE ) 800 MCG tablet Take 800  mcg by mouth daily.   Yes [provider]  lactose free nutrition (BOOST) LIQD Take 237 mLs by mouth 3 (three) times daily as needed (for weight loss).   Yes [provider]  memantine  (NAMENDA ) 10 MG tablet Take 1 tablet (10 mg total) by mouth 2 (two) times daily. 11/27/22  Yes Krishnan, Sendil K, MD  montelukast  (SINGULAIR ) 10 MG tablet Take 1 tablet (10 mg total) by mouth every morning. 07/03/23  Yes Denson Flake, MD  oseltamivir (TAMIFLU) 75 MG capsule Take 75 mg by mouth daily. 10/05/23  Yes  [provider]  polyethylene glycol (MIRALAX  / GLYCOLAX ) 17 g packet Take 17 g by mouth daily as needed for mild constipation. 11/27/22  Yes Krishnan, Sendil K, MD  pravastatin  (PRAVACHOL ) 10 MG tablet TAKE ONE TABLET BY MOUTH ONE TIME DAILY 08/04/23  Yes Byrum, Robert S, MD  traZODone  (DESYREL ) 100 MG tablet Take 1 tablet (100 mg total) by mouth at bedtime. 11/27/22  Yes Krishnan, Sendil K, MD  albuterol  (PROVENTIL ) (2.5 MG/3ML) 0.083% nebulizer solution Take 3 mLs (2.5 mg total) by nebulization every 6 (six) hours as needed for wheezing or shortness of breath. Patient not taking: Reported on 10/12/2023 11/27/22   Krishnan, Sendil K, MD  celecoxib (CELEBREX) 100 MG capsule TAKE ONE CAPSULE BY MOUTH ONE TIME DAILY WITH FOOD AS NEEDED Patient not taking: Reported on 10/12/2023 08/31/23   Byrum, Robert S, MD  Vitamin D, Ergocalciferol, (DRISDOL) 1.25 MG (50000 UNIT) CAPS capsule Take 50,000 Units by mouth once a week. Patient not taking: Reported on 10/12/2023 09/29/23   [provider]    Physical Exam: Vitals:   10/12/23 1427 10/12/23 1432  BP: 129/73   Pulse: 80   Resp: 18   Temp: 98.7 F (37.1 C)   TempSrc: Oral   SpO2: 98%   Weight:  66.9 kg  Height:  5\' 1"  (1.549 m)   Physical Exam Vitals and nursing note reviewed.  Constitutional:      General: She is awake. She is not in acute distress.    Appearance: Normal appearance. She is overweight. She is ill-appearing.     Interventions: Nasal cannula in place.  HENT:     Head: Normocephalic.     Nose: No rhinorrhea.     Mouth/Throat:     Mouth: Mucous membranes are moist.  Eyes:     General: No scleral icterus.    Pupils: Pupils are equal, round, and reactive to light.  Neck:     Vascular: No JVD.  Cardiovascular:     Rate and Rhythm: Normal rate and regular rhythm.     Heart sounds: S1 normal and S2 normal.  Pulmonary:     Effort: Pulmonary effort is normal.     Breath sounds: Wheezing and rhonchi present. No  rales.  Abdominal:     General: Bowel sounds are normal. There is no distension.     Palpations: Abdomen is soft.     Tenderness: There is no abdominal tenderness. There is no guarding.  Musculoskeletal:     Cervical back: Neck supple.     Right lower leg: No edema.     Left lower leg: No edema.  Skin:    General: Skin is warm and dry.     Coloration: Skin is not jaundiced.  Neurological:     General: No focal deficit present.     Mental Status: She is alert and oriented to person, place, and time.  Psychiatric:  Mood and Affect: Mood normal.        Behavior: Behavior normal. Behavior is cooperative.     Data Reviewed:  Results are pending, will review when available. 11/05/2016 echocardiogram. Study Conclusions:  - Left ventricle: The cavity size was normal. There was mild    concentric hypertrophy. Systolic function was normal. The    estimated ejection fraction was in the range of 60% to 65%. Wall    motion was normal; there were no regional wall motion    abnormalities. Doppler parameters are consistent with abnormal    left ventricular relaxation (grade 1 diastolic dysfunction).    Doppler parameters are consistent with elevated ventricular    end-diastolic filling pressure.  - Aortic valve: Trileaflet; normal thickness leaflets. There was no    regurgitation.  - Aortic root: The aortic root was normal in size.  - Mitral valve: There was no regurgitation.  - Right ventricle: Systolic function was normal.  - Right atrium: The atrium was normal in size.  - Tricuspid valve: There was no regurgitation.  - Pulmonary arteries: Systolic pressure could not be accurately    estimated.  - Inferior vena cava: The vessel was normal in size. The    respirophasic diameter changes were in the normal range (= 50%),    consistent with normal central venous pressure.  - Pericardium, extracardiac: There was no pericardial effusion.   EKG: Vent. rate 85 BPM PR interval 139  ms QRS duration 84 ms QT/QTcB 456/543 ms P-R-T axes 81 -30 80 Sinus rhythm Left axis deviation Prolonged QT interval  Assessment and Plan: Principal Problem:   Acute on chronic respiratory failure with hypoxia (HCC) In the setting of:   COPD with acute exacerbation (HCC)  Observation/telemetry Continue supplemental oxygen . Received methylprednisolone  125 mg IVP x1. -Will be followed by prednisone  40 mg p.o. daily in a.m. Scheduled and as needed bronchodilators. Check respiratory pathogen by PCR. Hold oseltamivir as influenza PCR was negative. Follow-up CBC and chemistry in the morning.   Active Problems:   Dementia (HCC)   Mild TBI (HCC) Supportive care. Continue donezepil 5 mg p.o. daily. Continue memantine  10 mg p.o. twice daily.    Generalized anxiety disorder   MDD (major depressive disorder) Continue buspirone  5 mg p.o. 3 times daily. Continue escitalopram  10 mg p.o. daily. Continue trazodone  100 mg p.o. at bedtime.    GERD (gastroesophageal reflux disease) Antiacid, H2 blocker or PPI as needed.    Tobacco use In remission.    Advance Care Planning:   Code Status: Full Code   Consults:   Family Communication:   Severity of Illness: The appropriate patient status for this patient is OBSERVATION. Observation status is judged to be reasonable and necessary in order to provide the required intensity of service to ensure the patient's safety. The patient's presenting symptoms, physical exam findings, and initial radiographic and laboratory data in the context of their medical condition is felt to place them at decreased risk for further clinical deterioration. Furthermore, it is anticipated that the patient will be medically stable for discharge from the hospital within 2 midnights of admission.   Author: Danice Dural, MD 10/12/2023 4:59 PM  For on call review [email or web address].   This document was prepared using Dragon voice recognition software and may  contain some unintended transcription errors.

## 2023-10-12 NOTE — ED Triage Notes (Signed)
 Pt reports weakness and cough.O2 dropped to low 80s on RA. 4L of O2 brought her up to 98%. Flu has been going around her facility. HX of COPD

## 2023-10-12 NOTE — ED Provider Notes (Signed)
 [place] EMERGENCY DEPARTMENT AT [facility] Provider Note   CSN: [id number] Arrival date & time: 10/12/23  1408     History  Chief Complaint  Patient presents with   Weakness    Kathy Skinner is a 78 y.o. female with dementia, asthma/COPD, GERD, GAD/MDD who presents with weakness and cough. O2 dropped to low 80s on RA. 4L of O2 brought her up to 98%. Flu has been going around her facility. Does not wear oxygen  at baseline.  Patient is A&Ox0, alert and interactive, cooperative, denies any pain/SOB, nausea/vomiting, abd pain. States she doesn't know why she is here and feels "fine" and "bubbly."  Past Medical History:  Diagnosis Date   Anxiety    Asthma    COPD (chronic obstructive pulmonary disease) (HCC)    Panic attacks        Home Medications Prior to Admission medications   Medication Sig Start Date End Date Taking? Authorizing Provider  acetaminophen  (TYLENOL ) 325 MG tablet Take 2 tablets (650 mg total) by mouth every 6 (six) hours as needed for mild pain (or Fever >/= 101). 11/27/22  Yes Krishnan, Sendil K, MD  albuterol  (PROAIR  HFA) 108 (90 Base) MCG/ACT inhaler Inhale 2 puffs into the lungs every 6 (six) hours as needed for wheezing or shortness of breath. 07/10/21  Yes Denson Flake, MD  busPIRone  (BUSPAR ) 5 MG tablet Take 5 mg by mouth 3 (three) times daily.   Yes [provider]  donepezil  (ARICEPT ) 5 MG tablet Take 5 mg by mouth daily. 10/05/23  Yes [provider]  escitalopram  (LEXAPRO ) 10 MG tablet Take 10 mg by mouth daily.   Yes [provider]  Fluticasone -Umeclidin-Vilant (TRELEGY ELLIPTA ) 100-62.5-25 MCG/ACT AEPB Inhale 1 puff into the lungs daily. 07/03/23  Yes Denson Flake, MD  folic acid  (FOLVITE ) 800 MCG tablet Take 800 mcg by mouth daily.   Yes [provider]  lactose free nutrition (BOOST) LIQD Take 237 mLs by mouth 3 (three) times daily as needed (for weight loss).   Yes [provider]   memantine  (NAMENDA ) 10 MG tablet Take 1 tablet (10 mg total) by mouth 2 (two) times daily. 11/27/22  Yes Krishnan, Sendil K, MD  montelukast  (SINGULAIR ) 10 MG tablet Take 1 tablet (10 mg total) by mouth every morning. 07/03/23  Yes Denson Flake, MD  oseltamivir (TAMIFLU) 75 MG capsule Take 75 mg by mouth daily. 10/05/23  Yes [provider]  polyethylene glycol (MIRALAX  / GLYCOLAX ) 17 g packet Take 17 g by mouth daily as needed for mild constipation. 11/27/22  Yes Krishnan, Sendil K, MD  pravastatin  (PRAVACHOL ) 10 MG tablet TAKE ONE TABLET BY MOUTH ONE TIME DAILY 08/04/23  Yes Byrum, Robert S, MD  traZODone  (DESYREL ) 100 MG tablet Take 1 tablet (100 mg total) by mouth at bedtime. 11/27/22  Yes Krishnan, Sendil K, MD  albuterol  (PROVENTIL ) (2.5 MG/3ML) 0.083% nebulizer solution Take 3 mLs (2.5 mg total) by nebulization every 6 (six) hours as needed for wheezing or shortness of breath. Patient not taking: Reported on 10/12/2023 11/27/22   Krishnan, Sendil K, MD  celecoxib (CELEBREX) 100 MG capsule TAKE ONE CAPSULE BY MOUTH ONE TIME DAILY WITH FOOD AS NEEDED Patient not taking: Reported on 10/12/2023 08/31/23   Byrum, Robert S, MD  Vitamin D, Ergocalciferol, (DRISDOL) 1.25 MG (50000 UNIT) CAPS capsule Take 50,000 Units by mouth once a week. Patient not taking: Reported on 10/12/2023 09/29/23   [provider]  Allergies    Neurontin [gabapentin], Tramadol , and Codeine    Review of Systems   Review of Systems A 10 point review of systems was performed and is negative unless otherwise reported in HPI.  Physical Exam Updated Vital Signs BP 129/73   Pulse 80   Temp 98.7 F (37.1 C) (Oral)   Resp 18   Ht 5\' 1"  (1.549 m)   Wt 66.9 kg   SpO2 98%   BMI 27.87 kg/m  Physical Exam General: Normal appearing elderly female, lying in bed.  HEENT: PERRLA, Sclera anicteric, MMM, trachea midline.  Cardiology: RRR, no murmurs/rubs/gallops.   Resp: Normal respiratory rate and effort. BL  expiratory wheezing and rhonchi.  Abd: Soft, non-tender, non-distended. No rebound tenderness or guarding.  GU: Deferred. MSK: No peripheral edema or signs of trauma. Extremities without deformity or TTP. No cyanosis or clubbing. Skin: warm, dry.  Neuro: A&Ox0, CNs II-XII grossly intact. MAEs. Sensation grossly intact.  Psych: Pleasant mood and affect.   ED Results / Procedures / Treatments   Labs (all labs ordered are listed, but only abnormal results are displayed) Labs Reviewed  CBC WITH DIFFERENTIAL/PLATELET - Abnormal; Notable for the following components:      Result Value   Hemoglobin 15.2 (*)    HCT 46.2 (*)    All other components within normal limits  COMPREHENSIVE METABOLIC PANEL - Abnormal; Notable for the following components:   Potassium 3.2 (*)    Glucose, Bld 112 (*)    Calcium 7.9 (*)    Albumin 3.0 (*)    All other components within normal limits  MAGNESIUM - Abnormal; Notable for the following components:   Magnesium 2.9 (*)    All other components within normal limits  BRAIN NATRIURETIC PEPTIDE - Abnormal; Notable for the following components:   B Natriuretic Peptide 129.1 (*)    All other components within normal limits  RESP PANEL BY RT-PCR (RSV, FLU A&B, COVID)  [id number]    EKG EKG Interpretation Date/Time:  Monday October 12 2023 15:20:11 EST Ventricular Rate:  85 PR Interval:  139 QRS Duration:  84 QT Interval:  456 QTC Calculation: 543 R Axis:   -30  Text Interpretation: Sinus rhythm Left axis deviation Prolonged QT interval Confirmed by Annita Kindle [phone]) on 10/12/2023 4:33:34 PM  Radiology No results found.  Procedures Procedures    Medications Ordered in ED Medications  ipratropium-albuterol  (DUONEB) 0.5-2.5 (3) MG/3ML nebulizer solution 3 mL (has no administration in time range)  ipratropium-albuterol  (DUONEB) 0.5-2.5 (3) MG/3ML nebulizer solution 3 mL (3 mLs Nebulization Given 10/12/23 1602)  methylPREDNISolone  sodium succinate  (SOLU-MEDROL ) 125 mg/2 mL injection 125 mg (125 mg Intravenous Given 10/12/23 1558)    ED Course/ Medical Decision Making/ A&P                          Medical Decision Making Amount and/or Complexity of Data Reviewed Labs: ordered. Decision-making details documented in ED Course. Radiology: ordered.  Risk Prescription drug management. Decision regarding hospitalization.    This patient presents to the ED for concern of hypoxia, weakness, cough; this involves an extensive number of treatment options, and is a complaint that carries with it a high risk of complications and morbidity.  I considered the following differential and admission for this acute, potentially life threatening condition. Patient is satting 94% on 3L [place].   MDM:    Consider COPD exacerbation d/t viral illness with acute hypoxic resp failure. She has  no increased WOB or resp distress, does not need BiPAP at this time, but does desaturate on RA. No leukocytosis or fever to indicate bacterial infection, and CXR doesn't show PNA or pulm edema. Does have mildly elevated BNP but no overt leg swelling/crackles. Neg flu/covid/RSV. With wheezing bilaterally and no signs/sxs of DVT, lower c/f PE at this time.    Clinical Course as of 10/12/23 1650  Mon Oct 12, 2023  1632 CBC with Differential(!) Possible volume contraction/dehydration?  Otherwise unremarkable, no leukocytosis or anemia [HN]  1632 Magnesium(!): 2.9 Mild hypermagnesemia [HN]  1632 Resp panel by RT-PCR (RSV, Flu A&B, Covid) Anterior Nasal Swab Negative [HN]  1632 Potassium(!): 3.2 Mild potassium, likely due to decreased p.o. intake, otherwise unremarkable CMP [HN]  1649 Patient with mildly improved wheezing after DuoNeb and Solu-Medrol , however still drops into mid 80s on room air.  Will consult for admission for COPD exacerbation. [HN]    Clinical Course User Index [HN] Merdis Stalling, MD    Labs: I Ordered, and personally interpreted labs.  The  pertinent results include:  those listed above  Imaging Studies ordered: I ordered imaging studies including CXR I independently visualized and interpreted imaging. I agree with the radiologist interpretation  Additional history obtained from chart review.    Cardiac Monitoring: The patient was maintained on a cardiac monitor.  I personally viewed and interpreted the cardiac monitored which showed an underlying rhythm of: NSR  Reevaluation: After the interventions noted above, I reevaluated the patient and found that they have :improved  Social Determinants of Health: Lives at facility  Disposition:  Admit  Co morbidities that complicate the patient evaluation  Past Medical History:  Diagnosis Date   Anxiety    Asthma    COPD (chronic obstructive pulmonary disease) (HCC)    Panic attacks      Medicines Meds ordered this encounter  Medications   ipratropium-albuterol  (DUONEB) 0.5-2.5 (3) MG/3ML nebulizer solution 3 mL   methylPREDNISolone  sodium succinate (SOLU-MEDROL ) 125 mg/2 mL injection 125 mg    IV methylprednisolone  will be converted to either a q12h or q24h frequency with the same total daily dose (TDD).  Ordered Dose: 1 to 125 mg TDD; convert to: TDD q24h.  Ordered Dose: 126 to 250 mg TDD; convert to: TDD div q12h.  Ordered Dose: >250 mg TDD; DAW.   ipratropium-albuterol  (DUONEB) 0.5-2.5 (3) MG/3ML nebulizer solution 3 mL    I have reviewed the patients home medicines and have made adjustments as needed  Problem List / ED Course: Problem List Items Addressed This Visit   None Visit Diagnoses       COPD exacerbation (HCC)    -  Primary   Relevant Medications   ipratropium-albuterol  (DUONEB) 0.5-2.5 (3) MG/3ML nebulizer solution 3 mL (Completed)   methylPREDNISolone  sodium succinate (SOLU-MEDROL ) 125 mg/2 mL injection 125 mg (Completed)   ipratropium-albuterol  (DUONEB) 0.5-2.5 (3) MG/3ML nebulizer solution 3 mL (Start on 10/12/2023  5:00 PM)     Acute hypoxic  respiratory failure (HCC)                       This note was created using dictation software, which may contain spelling or grammatical errors.    Merdis Stalling, MD 10/12/23 [phone]

## 2023-10-13 DIAGNOSIS — J9621 Acute and chronic respiratory failure with hypoxia: Secondary | ICD-10-CM | POA: Diagnosis not present

## 2023-10-13 LAB — COMPREHENSIVE METABOLIC PANEL
ALT: 12 U/L (ref 0–44)
AST: 15 U/L (ref 15–41)
Albumin: 2.5 g/dL — ABNORMAL LOW (ref 3.5–5.0)
Alkaline Phosphatase: 65 U/L (ref 38–126)
Anion gap: 10 (ref 5–15)
BUN: 16 mg/dL (ref 8–23)
CO2: 27 mmol/L (ref 22–32)
Calcium: 7 mg/dL — ABNORMAL LOW (ref 8.9–10.3)
Chloride: 104 mmol/L (ref 98–111)
Creatinine, Ser: 0.61 mg/dL (ref 0.44–1.00)
GFR, Estimated: >60 mL/min (ref >60)
Glucose, Bld: 124 mg/dL — ABNORMAL HIGH (ref 70–99)
Potassium: 2.8 mmol/L — ABNORMAL LOW (ref 3.5–5.1)
Sodium: 141 mmol/L (ref 135–145)
Total Bilirubin: 1 mg/dL (ref 0.0–1.2)
Total Protein: 6.7 g/dL (ref 6.5–8.1)

## 2023-10-13 LAB — CBC
HCT: 44.8 % (ref 36.0–46.0)
Hemoglobin: 14.4 g/dL (ref 12.0–15.0)
MCH: 30.4 pg (ref 26.0–34.0)
MCHC: 32.1 g/dL (ref 30.0–36.0)
MCV: 94.7 fL (ref 80.0–100.0)
Platelets: 259 10*3/uL (ref 150–400)
RBC: 4.73 MIL/uL (ref 3.87–5.11)
RDW: 12.7 % (ref 11.5–15.5)
WBC: 4.7 10*3/uL (ref 4.0–10.5)
nRBC: 0 % (ref 0.0–0.2)

## 2023-10-13 MED ORDER — BUDESONIDE 0.5 MG/2ML IN SUSP
0.5000 mg | Freq: Two times a day (BID) | RESPIRATORY_TRACT | Status: DC
  Administered 2023-10-13 – 2023-10-19 (×12): 0.5 mg via RESPIRATORY_TRACT
  Filled 2023-10-13 (×13): qty 2

## 2023-10-13 MED ORDER — POTASSIUM CHLORIDE CRYS ER 20 MEQ PO TBCR
30.0000 meq | EXTENDED_RELEASE_TABLET | ORAL | Status: AC
  Administered 2023-10-13 (×2): 30 meq via ORAL
  Filled 2023-10-13 (×2): qty 1

## 2023-10-13 MED ORDER — POTASSIUM CHLORIDE CRYS ER 10 MEQ PO TBCR
30.0000 meq | EXTENDED_RELEASE_TABLET | ORAL | Status: DC
  Administered 2023-10-13: 30 meq via ORAL
  Filled 2023-10-13: qty 1

## 2023-10-13 MED ORDER — IPRATROPIUM-ALBUTEROL 0.5-2.5 (3) MG/3ML IN SOLN
3.0000 mL | Freq: Three times a day (TID) | RESPIRATORY_TRACT | Status: DC
  Administered 2023-10-14 – 2023-10-19 (×16): 3 mL via RESPIRATORY_TRACT
  Filled 2023-10-13 (×17): qty 3

## 2023-10-13 MED ORDER — ARFORMOTEROL TARTRATE 15 MCG/2ML IN NEBU
15.0000 ug | INHALATION_SOLUTION | Freq: Two times a day (BID) | RESPIRATORY_TRACT | Status: DC
Start: 2023-10-13 — End: 2023-10-19
  Administered 2023-10-13 – 2023-10-19 (×12): 15 ug via RESPIRATORY_TRACT
  Filled 2023-10-13 (×13): qty 2

## 2023-10-13 NOTE — Care Management Obs Status (Signed)
MEDICARE OBSERVATION STATUS NOTIFICATION   Patient Details  Name: Kathy Skinner MRN: [id number] Date of Birth: 04-03-1946   Medicare Observation Status Notification Given:  Yes    Princella Ion, LCSW 10/13/2023, 10:51 AM

## 2023-10-13 NOTE — ED Notes (Signed)
Upon arrival pt unhooked from monitor completely. Pt was repositioned in bed and dry at this time. Pt was placed back on cardiac monitoring and new vitals obtained. 5 am labs were drawn.

## 2023-10-13 NOTE — Progress Notes (Signed)
PROGRESS NOTE    Kathy Skinner  WUJ:[id number] DOB: 02-01-1946 DOA: 10/12/2023 PCP: Laurell Josephs, MD (Inactive)    Brief Narrative:   Kathy Skinner is a 78 y.o. female with past medical history significant for dementia, asthma/COPD, anxiety/panic attacks, osteoporosis who presents to [facility] ED on 2/10 from [facility] memory care unit with weakness, cough and was noted to desaturate to 80% on room air.  Apparently there has been a flu outbreak in the facility.  Patient denied headache, no chest pain, no abdominal pain, no nausea/vomiting/diarrhea.  EMS was activated and patient was brought to the ED for further evaluation and management.  In the ED, temperature 98.7 F, HR 80, RR 18, BP 129/73, SpO2 98% room air.  WBC 6.3, hemoglobin 15.2, platelet count 237.  Sodium 143, potassium 3.2, chloride 102, CO2 29, glucose 112, BUN 13, creatinine 0.47.  AST 17, ALT 14, total bilirubin 1.2.  BNP 129.1.  COVID/influenza/RSV PCR negative.  Respiratory viral panel negative.  Chest x-ray with no acute cardiopulmonary disease process.  Patient received DuoNeb, Solu-Medrol 105 mg IV x 1.  TRH consulted for admission for further evaluation and management of acute hypoxic respite failure secondary to COPD exacerbation.  Assessment & Plan:   Acute hypoxic respiratory failure COPD exacerbation Patient presenting to ED after being found hypoxic at facility with SpO2 80% on room air and placed on supplemental oxygen.  Also has been having generalized weakness over the last several days with known influenza outbreak at the facility; although patient negative for influenza A/B, negative for COVID/RSV and respiratory viral panel were negative.  Chest x-ray with no acute cardiopulmonary disease process. -- Brovana neb twice daily -- Pulmicort neb twice daily -- Prednisone 40 mg p.o. daily -- Singulair 10 mg p.o. daily -- DuoNebs scheduled every 6 hours -- Continue supplemental oxygen, maintain  SpO2 greater than 88% -- Ambulatory O2 screen in a.m.  Hypokalemia Potassium 2.8, will replete. --Repeat electrolytes in a.m. to include magnesium  Anxiety/panic attacks -- Lexapro 10 mg p.o. daily -- BuSpar 5 mg p.o. 3 times daily  Hyperlipidemia -- Pravastatin 10 mg p.o. daily  Dementia -- Delirium precautions -- Get up during the day -- Encourage a familiar face to remain present throughout the day -- Keep blinds open and lights on during daylight hours -- Minimize the use of opioids/benzodiazepines -- Namenda 10 mg p.o. twice daily -- Donepezil 5 mg p.o. daily  Weakness/debility/deconditioning: -- PT/OT evaluation in the a.m.   DVT prophylaxis: enoxaparin (LOVENOX) injection 40 mg Start: 10/12/23 2200    Code Status: Full Code Family Communication: No family present at bedside this morning  Disposition Plan:  Level of care: Progressive Status is: Observation The patient remains OBS appropriate and will d/c before 2 midnights.    Consultants:  None  Procedures:  None  Antimicrobials:  None   Subjective: Patient seen examined bedside, resting calmly.  Lying in bed.  Remains in ED holding area.  Continues with shortness of breath, wheezing.  No family present.  Continues with low potassium, repleted.  No other specific questions, concerns or complaints at this time.  Denies headache, no dizziness, no chest pain, no palpitations, no abdominal pain, no fever/chills/night sweats, no nausea/vomiting/diarrhea, no focal weakness, no fatigue, no paresthesias.  No acute events overnight per nurse staff.  Objective: Vitals:   10/13/23 1216 10/13/23 1300 10/13/23 1407 10/13/23 1521  BP:  127/72 124/68 134/72  Pulse:  81 79 78  Resp:   20  20  Temp: 99 F (37.2 C)  98.7 F (37.1 C) 98.5 F (36.9 C)  TempSrc: Oral  Oral Oral  SpO2:  97% 95% 90%  Weight:      Height:       No intake or output data in the 24 hours ending 10/13/23 1624 Filed Weights   10/12/23  1432  Weight: 66.9 kg    Examination:  Physical Exam: GEN: NAD, alert and oriented x 3, elderly in appearance HEENT: NCAT, PERRL, EOMI, sclera clear, MMM PULM: Breath sounds slight diminished bilateral bases with wheezing, no crackles, normal respiratory effort without accessory muscle use, on 2 L nasal cannula with SpO2's 97% at rest CV: RRR w/o M/G/R GI: abd soft, NTND, NABS, no R/G/M MSK: no peripheral edema, moves lower extremities independently NEURO: No focal neurological deficits PSYCH: normal mood/affect Integumentary: No rashes/lesions/wounds noted on exposed skin surfaces    Data Reviewed: I have personally reviewed following labs and imaging studies  CBC: Recent Labs  Lab 10/12/23 1542 10/13/23 0720  WBC 6.3 4.7  NEUTROABS 4.9  --   HGB 15.2* 14.4  HCT 46.2* 44.8  MCV 94.7 94.7  PLT 237 259   Basic Metabolic Panel: Recent Labs  Lab 10/12/23 1542 10/13/23 0602  NA 143 141  K 3.2* 2.8*  CL 102 104  CO2 29 27  GLUCOSE 112* 124*  BUN 13 16  CREATININE 0.47 0.61  CALCIUM 7.9* 7.0*  MG 2.9*  --    GFR: Estimated Creatinine Clearance: 53.1 mL/min (by C-G formula based on SCr of 0.61 mg/dL). Liver Function Tests: Recent Labs  Lab 10/12/23 1542 10/13/23 0602  AST 17 15  ALT 14 12  ALKPHOS 78 65  BILITOT 1.2 1.0  PROT 7.8 6.7  ALBUMIN 3.0* 2.5*   No results for input(s): "LIPASE", "AMYLASE" in the last 168 hours. No results for input(s): "AMMONIA" in the last 168 hours. Coagulation Profile: No results for input(s): "INR", "PROTIME" in the last 168 hours. Cardiac Enzymes: No results for input(s): "CKTOTAL", "CKMB", "CKMBINDEX", "TROPONINI" in the last 168 hours. BNP (last 3 results) No results for input(s): "PROBNP" in the last 8760 hours. HbA1C: No results for input(s): "HGBA1C" in the last 72 hours. CBG: No results for input(s): "GLUCAP" in the last 168 hours. Lipid Profile: No results for input(s): "CHOL", "HDL", "LDLCALC", "TRIG",  "CHOLHDL", "LDLDIRECT" in the last 72 hours. Thyroid Function Tests: No results for input(s): "TSH", "T4TOTAL", "FREET4", "T3FREE", "THYROIDAB" in the last 72 hours. Anemia Panel: No results for input(s): "VITAMINB12", "FOLATE", "FERRITIN", "TIBC", "IRON", "RETICCTPCT" in the last 72 hours. Sepsis Labs: No results for input(s): "PROCALCITON", "LATICACIDVEN" in the last 168 hours.  Recent Results (from the past 240 hours)  Resp panel by RT-PCR (RSV, Flu A&B, Covid) Anterior Nasal Swab     Status: None   Collection Time: 10/12/23  2:53 PM   Specimen: Anterior Nasal Swab  Result Value Ref Range Status   SARS Coronavirus 2 by RT PCR NEGATIVE NEGATIVE Final    Comment: (NOTE) SARS-CoV-2 target nucleic acids are NOT DETECTED.  The SARS-CoV-2 RNA is generally detectable in upper respiratory specimens during the acute phase of infection. The lowest concentration of SARS-CoV-2 viral copies this assay can detect is 138 copies/mL. A negative result does not preclude SARS-Cov-2 infection and should not be used as the sole basis for treatment or other patient management decisions. A negative result may occur with  improper specimen collection/handling, submission of specimen other than nasopharyngeal swab, presence of  viral mutation(s) within the areas targeted by this assay, and inadequate number of viral copies(<138 copies/mL). A negative result must be combined with clinical observations, patient history, and epidemiological information. The expected result is Negative.  Fact Sheet for Patients:  BloggerCourse.com  Fact Sheet for Healthcare Providers:  SeriousBroker.it  This test is no t yet approved or cleared by the [place] FDA and  has been authorized for detection and/or diagnosis of SARS-CoV-2 by FDA under an Emergency Use Authorization (EUA). This EUA will remain  in effect (meaning this test can be used) for the duration of  the COVID-19 declaration under Section 564(b)(1) of the Act, 21 U.S.C.section 360bbb-3(b)(1), unless the authorization is terminated  or revoked sooner.       Influenza A by PCR NEGATIVE NEGATIVE Final   Influenza B by PCR NEGATIVE NEGATIVE Final    Comment: (NOTE) The Xpert Xpress SARS-CoV-2/FLU/RSV plus assay is intended as an aid in the diagnosis of influenza from Nasopharyngeal swab specimens and should not be used as a sole basis for treatment. Nasal washings and aspirates are unacceptable for Xpert Xpress SARS-CoV-2/FLU/RSV testing.  Fact Sheet for Patients: BloggerCourse.com  Fact Sheet for Healthcare Providers: SeriousBroker.it  This test is not yet approved or cleared by the [place] FDA and has been authorized for detection and/or diagnosis of SARS-CoV-2 by FDA under an Emergency Use Authorization (EUA). This EUA will remain in effect (meaning this test can be used) for the duration of the COVID-19 declaration under Section 564(b)(1) of the Act, 21 U.S.C. section 360bbb-3(b)(1), unless the authorization is terminated or revoked.     Resp Syncytial Virus by PCR NEGATIVE NEGATIVE Final    Comment: (NOTE) Fact Sheet for Patients: BloggerCourse.com  Fact Sheet for Healthcare Providers: SeriousBroker.it  This test is not yet approved or cleared by the [place] FDA and has been authorized for detection and/or diagnosis of SARS-CoV-2 by FDA under an Emergency Use Authorization (EUA). This EUA will remain in effect (meaning this test can be used) for the duration of the COVID-19 declaration under Section 564(b)(1) of the Act, 21 U.S.C. section 360bbb-3(b)(1), unless the authorization is terminated or revoked.  Performed at [facility], [address] [place], [place] [address]   Respiratory (~20 pathogens) panel by PCR     Status: None    Collection Time: 10/12/23  2:53 PM   Specimen: Nasopharyngeal Swab; Respiratory  Result Value Ref Range Status   Adenovirus NOT DETECTED NOT DETECTED Final   Coronavirus 229E NOT DETECTED NOT DETECTED Final    Comment: (NOTE) The Coronavirus on the Respiratory Panel, DOES NOT test for the novel  Coronavirus (2019 nCoV)    Coronavirus HKU1 NOT DETECTED NOT DETECTED Final   Coronavirus NL63 NOT DETECTED NOT DETECTED Final   Coronavirus OC43 NOT DETECTED NOT DETECTED Final   Metapneumovirus NOT DETECTED NOT DETECTED Final   Rhinovirus / Enterovirus NOT DETECTED NOT DETECTED Final   Influenza A NOT DETECTED NOT DETECTED Final   Influenza B NOT DETECTED NOT DETECTED Final   Parainfluenza Virus 1 NOT DETECTED NOT DETECTED Final   Parainfluenza Virus 2 NOT DETECTED NOT DETECTED Final   Parainfluenza Virus 3 NOT DETECTED NOT DETECTED Final   Parainfluenza Virus 4 NOT DETECTED NOT DETECTED Final   Respiratory Syncytial Virus NOT DETECTED NOT DETECTED Final   Bordetella pertussis NOT DETECTED NOT DETECTED Final   Bordetella Parapertussis NOT DETECTED NOT DETECTED Final   Chlamydophila pneumoniae NOT DETECTED NOT DETECTED Final  Mycoplasma pneumoniae NOT DETECTED NOT DETECTED Final    Comment: Performed at [facility], [address] [place], [place] [address]         Radiology Studies: DG Chest Portable 1 View Result Date: 10/12/2023 CLINICAL DATA:  Weakness and cough. EXAM: PORTABLE CHEST 1 VIEW COMPARISON:  Chest radiograph dated November 25, 2022. FINDINGS: Limited exam due to suboptimal positioning/patient rotation. Stable borderline cardiomegaly. Aortic atherosclerosis. No appreciable focal consolidation, large pleural effusion, or pneumothorax. The right costophrenic angle is not included within the field of view. No acute osseous abnormality identified. IMPRESSION: 1. No acute cardiopulmonary findings. 2. Borderline cardiomegaly. Electronically Signed   By: Hart Robinsons M.D.    On: 10/12/2023 17:04        Scheduled Meds:  arformoterol  15 mcg Nebulization BID   budesonide (PULMICORT) nebulizer solution  0.5 mg Nebulization BID   busPIRone  5 mg Oral TID   donepezil  5 mg Oral Daily   enoxaparin (LOVENOX) injection  40 mg Subcutaneous Q24H   escitalopram  10 mg Oral Daily   folic acid  1 mg Oral Daily   ipratropium-albuterol  3 mL Nebulization TID   memantine  10 mg Oral BID   montelukast  10 mg Oral q morning   potassium chloride  30 mEq Oral Q3H   pravastatin  10 mg Oral Daily   predniSONE  40 mg Oral Q breakfast   traZODone  100 mg Oral QHS   Continuous Infusions:   LOS: 0 days    Time spent: 56 minutes spent on chart review, discussion with nursing staff, consultants, updating family and interview/physical exam; more than 50% of that time was spent in counseling and/or coordination of care.    Alvira Philips [place], DO [facility] Available via Epic secure chat 7am-7pm After these hours, please refer to coverage provider listed on [email or web address] 10/13/2023, 4:24 PM

## 2023-10-13 NOTE — ED Notes (Signed)
Report given to Leotis Shames, RN.

## 2023-10-13 NOTE — ED Notes (Signed)
ED TO INPATIENT HANDOFF REPORT  ED Nurse Name and Phone #: Wynema Birch Name/Age/Gender Kathy Skinner 78 y.o. female Room/Bed: WA23/WA23  Code Status   Code Status: Full Code  Home/SNF/Other Skilled nursing facility Patient oriented to: self and place Is this baseline? Yes   Triage Complete: Triage complete  Chief Complaint Acute respiratory failure with hypoxia (HCC) [J96.01]  Triage Note Pt reports weakness and cough.O2 dropped to low 80s on RA. 4L of O2 brought her up to 98%. Flu has been going around her facility. HX of COPD   Allergies Allergies  Allergen Reactions   Neurontin [Gabapentin] Other (See Comments)    seizures   Tramadol Swelling   Codeine Nausea Only    Level of Care/Admitting Diagnosis ED Disposition     ED Disposition  Admit   Condition  --   Comment  Hospital Area: [facility] [[id number]]  Level of Care: Progressive [102]  Admit to Progressive based on following criteria: RESPIRATORY PROBLEMS hypoxemic/hypercapnic respiratory failure that is responsive to NIPPV (BiPAP) or High Flow Nasal Cannula (6-80 lpm). Frequent assessment/intervention, no > Q2 hrs < Q4 hrs, to maintain oxygenation and pulmonary hygiene.  May place patient in observation at [facility] or [facility] if equivalent level of care is available:: No  Covid Evaluation: Asymptomatic - no recent exposure (last 10 days) testing not required  Diagnosis: Acute respiratory failure with hypoxia [facility]) [[id number]]  Admitting Physician: Bobette Mo [[id number]]  Attending Physician: Bobette Mo [[id number]]          B Medical/Surgery History Past Medical History:  Diagnosis Date   Anxiety    Asthma    COPD (chronic obstructive pulmonary disease) (HCC)    Panic attacks    Past Surgical History:  Procedure Laterality Date   TUBAL LIGATION       A IV Location/Drains/Wounds Patient Lines/Drains/Airways Status     Active Line/Drains/Airways     Name  Placement date Placement time Site Days   Peripheral IV 10/12/23 20 G Anterior;Proximal;Right Forearm 10/12/23  1543  Forearm  1            Intake/Output Last 24 hours No intake or output data in the 24 hours ending 10/13/23 1402  Labs/Imaging Results for orders placed or performed during the hospital encounter of 10/12/23 (from the past 48 hours)  Resp panel by RT-PCR (RSV, Flu A&B, Covid) Anterior Nasal Swab     Status: None   Collection Time: 10/12/23  2:53 PM   Specimen: Anterior Nasal Swab  Result Value Ref Range   SARS Coronavirus 2 by RT PCR NEGATIVE NEGATIVE    Comment: (NOTE) SARS-CoV-2 target nucleic acids are NOT DETECTED.  The SARS-CoV-2 RNA is generally detectable in upper respiratory specimens during the acute phase of infection. The lowest concentration of SARS-CoV-2 viral copies this assay can detect is 138 copies/mL. A negative result does not preclude SARS-Cov-2 infection and should not be used as the sole basis for treatment or other patient management decisions. A negative result may occur with  improper specimen collection/handling, submission of specimen other than nasopharyngeal swab, presence of viral mutation(s) within the areas targeted by this assay, and inadequate number of viral copies(<138 copies/mL). A negative result must be combined with clinical observations, patient history, and epidemiological information. The expected result is Negative.  Fact Sheet for Patients:  BloggerCourse.com  Fact Sheet for Healthcare Providers:  SeriousBroker.it  This test is no t yet approved or cleared by the [place]  [place] FDA and  has been authorized for detection and/or diagnosis of SARS-CoV-2 by FDA under an Emergency Use Authorization (EUA). This EUA will remain  in effect (meaning this test can be used) for the duration of the COVID-19 declaration under Section 564(b)(1) of the Act, 21 U.S.C.section  360bbb-3(b)(1), unless the authorization is terminated  or revoked sooner.       Influenza A by PCR NEGATIVE NEGATIVE   Influenza B by PCR NEGATIVE NEGATIVE    Comment: (NOTE) The Xpert Xpress SARS-CoV-2/FLU/RSV plus assay is intended as an aid in the diagnosis of influenza from Nasopharyngeal swab specimens and should not be used as a sole basis for treatment. Nasal washings and aspirates are unacceptable for Xpert Xpress SARS-CoV-2/FLU/RSV testing.  Fact Sheet for Patients: BloggerCourse.com  Fact Sheet for Healthcare Providers: SeriousBroker.it  This test is not yet approved or cleared by the [place] FDA and has been authorized for detection and/or diagnosis of SARS-CoV-2 by FDA under an Emergency Use Authorization (EUA). This EUA will remain in effect (meaning this test can be used) for the duration of the COVID-19 declaration under Section 564(b)(1) of the Act, 21 U.S.C. section 360bbb-3(b)(1), unless the authorization is terminated or revoked.     Resp Syncytial Virus by PCR NEGATIVE NEGATIVE    Comment: (NOTE) Fact Sheet for Patients: BloggerCourse.com  Fact Sheet for Healthcare Providers: SeriousBroker.it  This test is not yet approved or cleared by the [place] FDA and has been authorized for detection and/or diagnosis of SARS-CoV-2 by FDA under an Emergency Use Authorization (EUA). This EUA will remain in effect (meaning this test can be used) for the duration of the COVID-19 declaration under Section 564(b)(1) of the Act, 21 U.S.C. section 360bbb-3(b)(1), unless the authorization is terminated or revoked.  Performed at [facility], [address], [place], [place] [address]   Respiratory (~20 pathogens) panel by PCR     Status: None   Collection Time: 10/12/23  2:53 PM   Specimen: Nasopharyngeal Swab; Respiratory  Result Value  Ref Range   Adenovirus NOT DETECTED NOT DETECTED   Coronavirus 229E NOT DETECTED NOT DETECTED    Comment: (NOTE) The Coronavirus on the Respiratory Panel, DOES NOT test for the novel  Coronavirus (2019 nCoV)    Coronavirus HKU1 NOT DETECTED NOT DETECTED   Coronavirus NL63 NOT DETECTED NOT DETECTED   Coronavirus OC43 NOT DETECTED NOT DETECTED   Metapneumovirus NOT DETECTED NOT DETECTED   Rhinovirus / Enterovirus NOT DETECTED NOT DETECTED   Influenza A NOT DETECTED NOT DETECTED   Influenza B NOT DETECTED NOT DETECTED   Parainfluenza Virus 1 NOT DETECTED NOT DETECTED   Parainfluenza Virus 2 NOT DETECTED NOT DETECTED   Parainfluenza Virus 3 NOT DETECTED NOT DETECTED   Parainfluenza Virus 4 NOT DETECTED NOT DETECTED   Respiratory Syncytial Virus NOT DETECTED NOT DETECTED   Bordetella pertussis NOT DETECTED NOT DETECTED   Bordetella Parapertussis NOT DETECTED NOT DETECTED   Chlamydophila pneumoniae NOT DETECTED NOT DETECTED   Mycoplasma pneumoniae NOT DETECTED NOT DETECTED    Comment: Performed at [facility], [address], [place], [place] [address]  CBC with Differential     Status: Abnormal   Collection Time: 10/12/23  3:42 PM  Result Value Ref Range   WBC 6.3 4.0 - 10.5 K/uL   RBC 4.88 3.87 - 5.11 MIL/uL   Hemoglobin 15.2 (H) 12.0 - 15.0 g/dL   HCT 95.6 (H) 21.3 - 08.6 %   MCV 94.7  80.0 - 100.0 fL   MCH 31.1 26.0 - 34.0 pg   MCHC 32.9 30.0 - 36.0 g/dL   RDW 82.9 56.2 - 13.0 %   Platelets 237 150 - 400 K/uL   nRBC 0.0 0.0 - 0.2 %   Neutrophils Relative % 77 %   Neutro Abs 4.9 1.7 - 7.7 K/uL   Lymphocytes Relative 10 %   Lymphs Abs 0.7 0.7 - 4.0 K/uL   Monocytes Relative 12 %   Monocytes Absolute 0.8 0.1 - 1.0 K/uL   Eosinophils Relative 0 %   Eosinophils Absolute 0.0 0.0 - 0.5 K/uL   Basophils Relative 1 %   Basophils Absolute 0.0 0.0 - 0.1 K/uL   Immature Granulocytes 0 %   Abs Immature Granulocytes 0.02 0.00 - 0.07 K/uL    Comment: Performed at [facility], [address], [place], [place] [address]  Comprehensive metabolic panel     Status: Abnormal   Collection Time: 10/12/23  3:42 PM  Result Value Ref Range   Sodium 143 135 - 145 mmol/L   Potassium 3.2 (L) 3.5 - 5.1 mmol/L   Chloride 102 98 - 111 mmol/L   CO2 29 22 - 32 mmol/L   Glucose, Bld 112 (H) 70 - 99 mg/dL    Comment: Glucose reference range applies only to samples taken after fasting for at least 8 hours.   BUN 13 8 - 23 mg/dL   Creatinine, Ser 4.69 0.44 - 1.00 mg/dL   Calcium 7.9 (L) 8.9 - 10.3 mg/dL   Total Protein 7.8 6.5 - 8.1 g/dL   Albumin 3.0 (L) 3.5 - 5.0 g/dL   AST 17 15 - 41 U/L   ALT 14 0 - 44 U/L   Alkaline Phosphatase 78 38 - 126 U/L   Total Bilirubin 1.2 0.0 - 1.2 mg/dL   GFR, Estimated >62 >95 mL/min    Comment: (NOTE) Calculated using the CKD-EPI Creatinine Equation (2021)    Anion gap 12 5 - 15    Comment: Performed at [facility], [address] [place], [place] [address]  Magnesium     Status: Abnormal   Collection Time: 10/12/23  3:42 PM  Result Value Ref Range   Magnesium 2.9 (H) 1.7 - 2.4 mg/dL    Comment: Performed at [facility], [address], [place], [place] [address]  Brain natriuretic peptide     Status: Abnormal   Collection Time: 10/12/23  3:42 PM  Result Value Ref Range   B Natriuretic Peptide 129.1 (H) 0.0 - 100.0 pg/mL    Comment: Performed at [facility], [address] [place], [place] [address]  Comprehensive metabolic panel     Status: Abnormal   Collection Time: 10/13/23  6:02 AM  Result Value Ref Range   Sodium 141 135 - 145 mmol/L   Potassium 2.8 (L) 3.5 - 5.1 mmol/L   Chloride 104 98 - 111 mmol/L   CO2 27 22 - 32 mmol/L   Glucose, Bld 124 (H) 70 - 99 mg/dL    Comment: Glucose reference range applies only to samples taken after fasting for at least 8 hours.   BUN 16 8 - 23 mg/dL   Creatinine, Ser 3.66 0.44 - 1.00 mg/dL   Calcium 7.0 (L) 8.9  - 10.3 mg/dL   Total Protein 6.7 6.5 - 8.1 g/dL   Albumin 2.5 (L) 3.5 - 5.0 g/dL   AST 15 15 - 41 U/L  ALT 12 0 - 44 U/L   Alkaline Phosphatase 65 38 - 126 U/L   Total Bilirubin 1.0 0.0 - 1.2 mg/dL   GFR, Estimated >54 >09 mL/min    Comment: (NOTE) Calculated using the CKD-EPI Creatinine Equation (2021)    Anion gap 10 5 - 15    Comment: Performed at [facility], [address], [place], [place] [address]  CBC     Status: None   Collection Time: 10/13/23  7:20 AM  Result Value Ref Range   WBC 4.7 4.0 - 10.5 K/uL   RBC 4.73 3.87 - 5.11 MIL/uL   Hemoglobin 14.4 12.0 - 15.0 g/dL   HCT 47.8 29.5 - 62.1 %   MCV 94.7 80.0 - 100.0 fL   MCH 30.4 26.0 - 34.0 pg   MCHC 32.1 30.0 - 36.0 g/dL   RDW 30.8 65.7 - 84.6 %   Platelets 259 150 - 400 K/uL   nRBC 0.0 0.0 - 0.2 %    Comment: Performed at [facility], [address] [place], [place] [address]   DG Chest Portable 1 View Result Date: 10/12/2023 CLINICAL DATA:  Weakness and cough. EXAM: PORTABLE CHEST 1 VIEW COMPARISON:  Chest radiograph dated November 25, 2022. FINDINGS: Limited exam due to suboptimal positioning/patient rotation. Stable borderline cardiomegaly. Aortic atherosclerosis. No appreciable focal consolidation, large pleural effusion, or pneumothorax. The right costophrenic angle is not included within the field of view. No acute osseous abnormality identified. IMPRESSION: 1. No acute cardiopulmonary findings. 2. Borderline cardiomegaly. Electronically Signed   By: Hart Robinsons M.D.   On: 10/12/2023 17:04    Pending Labs Unresulted Labs (From admission, onward)    None       Vitals/Pain Today's Vitals   10/13/23 1100 10/13/23 1200 10/13/23 1216 10/13/23 1300  BP: 124/73 125/72  127/72  Pulse: 73 81  81  Resp: 20     Temp:   99 F (37.2 C)   TempSrc:   Oral   SpO2: 94% (!) 89%  97%  Weight:      Height:      PainSc:        Isolation Precautions Droplet  precaution  Medications Medications  enoxaparin (LOVENOX) injection 40 mg (40 mg Subcutaneous Given 10/12/23 2223)  ondansetron (ZOFRAN) tablet 4 mg (has no administration in time range)    Or  ondansetron (ZOFRAN) injection 4 mg (has no administration in time range)  acetaminophen (TYLENOL) tablet 650 mg (has no administration in time range)    Or  acetaminophen (TYLENOL) suppository 650 mg (has no administration in time range)  ipratropium-albuterol (DUONEB) 0.5-2.5 (3) MG/3ML nebulizer solution 3 mL (3 mLs Nebulization Given 10/13/23 1241)  albuterol (PROVENTIL) (2.5 MG/3ML) 0.083% nebulizer solution 2.5 mg (has no administration in time range)  predniSONE (DELTASONE) tablet 40 mg (40 mg Oral Given 10/13/23 0828)  busPIRone (BUSPAR) tablet 5 mg (5 mg Oral Given 10/13/23 0956)  donepezil (ARICEPT) tablet 5 mg (5 mg Oral Given 10/12/23 2223)  escitalopram (LEXAPRO) tablet 10 mg (10 mg Oral Given 10/13/23 0957)  memantine (NAMENDA) tablet 10 mg (10 mg Oral Given 10/13/23 0956)  folic acid (FOLVITE) tablet 1 mg (1 mg Oral Given 10/13/23 0957)  montelukast (SINGULAIR) tablet 10 mg (10 mg Oral Given 10/13/23 0956)  polyethylene glycol (MIRALAX / GLYCOLAX) packet 17 g (has no administration in time range)  pravastatin (PRAVACHOL) tablet 10 mg (10 mg Oral Given 10/13/23 0956)  traZODone (DESYREL) tablet 100 mg (100 mg  Oral Given 10/12/23 2222)  potassium chloride (KLOR-CON M) CR tablet 30 mEq (30 mEq Oral Given 10/13/23 0828)  arformoterol (BROVANA) nebulizer solution 15 mcg (15 mcg Nebulization Given 10/13/23 0952)  budesonide (PULMICORT) nebulizer solution 0.5 mg (0.5 mg Nebulization Given 10/13/23 0958)  ipratropium-albuterol (DUONEB) 0.5-2.5 (3) MG/3ML nebulizer solution 3 mL (3 mLs Nebulization Given 10/12/23 1602)  methylPREDNISolone sodium succinate (SOLU-MEDROL) 125 mg/2 mL injection 125 mg (125 mg Intravenous Given 10/12/23 1558)  ipratropium-albuterol (DUONEB) 0.5-2.5 (3) MG/3ML nebulizer solution 3  mL (3 mLs Nebulization Given 10/12/23 1654)    Mobility walks with device     Focused Assessments Pulmonary Assessment Handoff:  Lung sounds: Bilateral Breath Sounds: Diminished, Expiratory wheezes O2 Device: Nasal Cannula O2 Flow Rate (L/min): 3 L/min    R Recommendations: See Admitting Provider Note  Report given to:   Additional Notes:  Dementia

## 2023-10-13 NOTE — ED Notes (Addendum)
Report given to [facility], Med Tech when she called to check on patient.

## 2023-10-13 NOTE — Progress Notes (Signed)
Pt refused her breathing tx. RT encourage pt to take but she would not. No distress noted at this time.

## 2023-10-13 NOTE — Progress Notes (Addendum)
CSW attempted to contact pt's daughter, Lynnell Dike, to complete MOON. Pt is currently 17h68m Obs. Left HIPAA Compliant voicemail requesting call back. Will try back.

## 2023-10-14 ENCOUNTER — Observation Stay (HOSPITAL_COMMUNITY): Payer: 59

## 2023-10-14 DIAGNOSIS — R0609 Other forms of dyspnea: Secondary | ICD-10-CM

## 2023-10-14 DIAGNOSIS — I700 Atherosclerosis of aorta: Secondary | ICD-10-CM | POA: Diagnosis not present

## 2023-10-14 DIAGNOSIS — J9811 Atelectasis: Secondary | ICD-10-CM | POA: Diagnosis not present

## 2023-10-14 DIAGNOSIS — R0602 Shortness of breath: Secondary | ICD-10-CM | POA: Diagnosis not present

## 2023-10-14 DIAGNOSIS — J9621 Acute and chronic respiratory failure with hypoxia: Secondary | ICD-10-CM | POA: Diagnosis not present

## 2023-10-14 LAB — BASIC METABOLIC PANEL
Anion gap: 9 (ref 5–15)
BUN: 21 mg/dL (ref 8–23)
CO2: 24 mmol/L (ref 22–32)
Calcium: 7.7 mg/dL — ABNORMAL LOW (ref 8.9–10.3)
Chloride: 101 mmol/L (ref 98–111)
Creatinine, Ser: 0.5 mg/dL (ref 0.44–1.00)
GFR, Estimated: >60 mL/min (ref >60)
Glucose, Bld: 95 mg/dL (ref 70–99)
Potassium: 4.1 mmol/L (ref 3.5–5.1)
Sodium: 134 mmol/L — ABNORMAL LOW (ref 135–145)

## 2023-10-14 LAB — ECHOCARDIOGRAM COMPLETE
AR max vel: 2.46 cm2
AV Area VTI: 2.42 cm2
AV Area mean vel: 2.31 cm2
AV Mean grad: 3 mm[Hg]
AV Peak grad: 6.1 mm[Hg]
Ao pk vel: 1.23 m/s
Area-P 1/2: 2.77 cm2
Height: 61 in
MV VTI: 3.2 cm2
S' Lateral: 3.3 cm
Weight: 2359.8 [oz_av]

## 2023-10-14 LAB — BRAIN NATRIURETIC PEPTIDE: B Natriuretic Peptide: 139.3 pg/mL — ABNORMAL HIGH (ref 0.0–100.0)

## 2023-10-14 LAB — MAGNESIUM: Magnesium: 2.8 mg/dL — ABNORMAL HIGH (ref 1.7–2.4)

## 2023-10-14 MED ORDER — METHYLPREDNISOLONE SODIUM SUCC 40 MG IJ SOLR
40.0000 mg | Freq: Two times a day (BID) | INTRAMUSCULAR | Status: DC
  Administered 2023-10-14 – 2023-10-20 (×13): 40 mg via INTRAVENOUS
  Filled 2023-10-14 (×13): qty 1

## 2023-10-14 MED ORDER — FUROSEMIDE 10 MG/ML IJ SOLN
20.0000 mg | Freq: Once | INTRAMUSCULAR | Status: AC
Start: 2023-10-14 — End: 2023-10-14
  Administered 2023-10-14: 20 mg via INTRAVENOUS
  Filled 2023-10-14: qty 2

## 2023-10-14 NOTE — Progress Notes (Addendum)
PROGRESS NOTE    Kathy Skinner  ZOX:[id number] DOB: [birth year] DOA: 10/12/2023 PCP: Laurell Josephs, MD (Inactive)    Brief Narrative:   Kathy Skinner is a 78 y.o. female with past medical history significant for dementia, asthma/COPD, anxiety/panic attacks, osteoporosis who presents to [facility] ED on 2/10 from [facility] memory care unit with weakness, cough and was noted to desaturate to 80% on room air.  Apparently there has been a flu outbreak in the facility.  Patient denied headache, no chest pain, no abdominal pain, no nausea/vomiting/diarrhea.  EMS was activated and patient was brought to the ED for further evaluation and management.  In the ED, temperature 98.7 F, HR 80, RR 18, BP 129/73, SpO2 98% room air.  WBC 6.3, hemoglobin 15.2, platelet count 237.  Sodium 143, potassium 3.2, chloride 102, CO2 29, glucose 112, BUN 13, creatinine 0.47.  AST 17, ALT 14, total bilirubin 1.2.  BNP 129.1.  COVID/influenza/RSV PCR negative.  Respiratory viral panel negative.  Chest x-ray with no acute cardiopulmonary disease process.  Patient received DuoNeb, Solu-Medrol 125 mg IV x 1.  TRH consulted for admission for further evaluation and management of acute hypoxic respite failure secondary to COPD exacerbation.  Assessment & Plan:   Acute hypoxic respiratory failure COPD exacerbation Patient presenting to ED after being found hypoxic at facility with SpO2 80% on room air and placed on supplemental oxygen.  Also has been having generalized weakness over the last several days with known influenza outbreak at the facility; although patient negative for influenza A/B, negative for COVID/RSV and respiratory viral panel were negative.  Chest x-ray with no acute cardiopulmonary disease process. -- Brovana neb twice daily -- Pulmicort neb twice daily -- Solu-Medrol 40 mg IV every 12 hours -- Singulair 10 mg p.o. daily -- DuoNebs scheduled every 6 hours -- Continue supplemental oxygen,  maintain SpO2 greater than 88%; currently on 4.5 L nasal cannula with SpO2 92% at rest -- Repeat chest x-ray today -- Ambulatory O2 screen   Elevated BNP BNP elevated on admission 129.1.  TTE 2018 with preserved LVEF, grade 1 diastolic dysfunction -- Repeat BMP today. -- Update TTE -- Lasix 20 mg IV x 1  Hypokalemia Repleted.  Potassium 4.1 this morning. --Repeat electrolytes in a.m. to include magnesium  Anxiety/panic attacks -- Lexapro 10 mg p.o. daily -- BuSpar 5 mg p.o. 3 times daily  Hyperlipidemia -- Pravastatin 10 mg p.o. daily  Dementia -- Delirium precautions -- Get up during the day -- Encourage a familiar face to remain present throughout the day -- Keep blinds open and lights on during daylight hours -- Minimize the use of opioids/benzodiazepines -- Namenda 10 mg p.o. twice daily -- Donepezil 5 mg p.o. daily  Weakness/debility/deconditioning: -- PT/OT evaluation: pending  Ethics: Discussed with patient's daughter, Susie via telephone on 10/14/2023 regarding CODE STATUS.  Patient's wishes are to be DNR without CPR, intubation or significant resuscitation as this would be "inhumane" given her advanced age, dementia and osteoporotic status.  CODE STATUS changed to DNR.   DVT prophylaxis: enoxaparin (LOVENOX) injection 40 mg Start: 10/12/23 2200    Code Status: Full Code Family Communication: No family present at bedside this morning, updated patient's daughter Susie via telephone this morning  Disposition Plan:  Level of care: Med-Surg Status is: Observation The patient remains OBS appropriate and will d/c before 2 midnights.    Consultants:  None  Procedures:  TTE: Pending  Antimicrobials:  None   Subjective: Patient seen examined  bedside, resting calmly.  Lying in bed.  Pleasantly confused.  No family present at bedside, updated patient's daughter Lynnell Dike via telephone this morning.  Remains on 5 L nasal cannula, not oxygen dependent at baseline.   Repeating chest x-ray, BNP.  Will give dose of IV Lasix. No other specific questions, concerns or complaints at this time.  Denies headache, no dizziness, no chest pain,no abdominal pain, no fever, no nausea/vomiting/diarrhea.  No acute events overnight per nurse staff.  Objective: Vitals:   10/13/23 2102 10/14/23 0049 10/14/23 0421 10/14/23 0940  BP: 117/65 (!) 104/59 113/64   Pulse: 68 65 69   Resp:      Temp: 98.6 F (37 C) 98.4 F (36.9 C) (!) 97.5 F (36.4 C)   TempSrc: Oral Oral Oral   SpO2: 97% 92% (!) 87% 92%  Weight:      Height:        Intake/Output Summary (Last 24 hours) at 10/14/2023 1201 Last data filed at 10/13/2023 1800 Gross per 24 hour  Intake 120 ml  Output --  Net 120 ml   Filed Weights   10/12/23 1432  Weight: 66.9 kg    Examination:  Physical Exam: GEN: NAD, alert, oriented to self but not to person, place, time or situation, elderly in appearance HEENT: NCAT, PERRL, EOMI, sclera clear, MMM PULM: Breath sounds slight diminished bilateral bases with wheezing, no crackles, normal respiratory effort without accessory muscle use, on 5 L nasal cannula with SpO2's 92% at rest CV: RRR w/o M/G/R GI: abd soft, NTND, NABS, no R/G/M MSK: no peripheral edema, moves lower extremities independently NEURO: No focal neurological deficits PSYCH: normal mood/affect Integumentary: No rashes/lesions/wounds noted on exposed skin surfaces    Data Reviewed: I have personally reviewed following labs and imaging studies  CBC: Recent Labs  Lab 10/12/23 1542 10/13/23 0720  WBC 6.3 4.7  NEUTROABS 4.9  --   HGB 15.2* 14.4  HCT 46.2* 44.8  MCV 94.7 94.7  PLT 237 259   Basic Metabolic Panel: Recent Labs  Lab 10/12/23 1542 10/13/23 0602 10/14/23 0536  NA 143 141 134*  K 3.2* 2.8* 4.1  CL 102 104 101  CO2 29 27 24   GLUCOSE 112* 124* 95  BUN 13 16 21   CREATININE 0.47 0.61 0.50  CALCIUM 7.9* 7.0* 7.7*  MG 2.9*  --  2.8*   GFR: Estimated Creatinine  Clearance: 53.1 mL/min (by C-G formula based on SCr of 0.5 mg/dL). Liver Function Tests: Recent Labs  Lab 10/12/23 1542 10/13/23 0602  AST 17 15  ALT 14 12  ALKPHOS 78 65  BILITOT 1.2 1.0  PROT 7.8 6.7  ALBUMIN 3.0* 2.5*   No results for input(s): "LIPASE", "AMYLASE" in the last 168 hours. No results for input(s): "AMMONIA" in the last 168 hours. Coagulation Profile: No results for input(s): "INR", "PROTIME" in the last 168 hours. Cardiac Enzymes: No results for input(s): "CKTOTAL", "CKMB", "CKMBINDEX", "TROPONINI" in the last 168 hours. BNP (last 3 results) No results for input(s): "PROBNP" in the last 8760 hours. HbA1C: No results for input(s): "HGBA1C" in the last 72 hours. CBG: No results for input(s): "GLUCAP" in the last 168 hours. Lipid Profile: No results for input(s): "CHOL", "HDL", "LDLCALC", "TRIG", "CHOLHDL", "LDLDIRECT" in the last 72 hours. Thyroid Function Tests: No results for input(s): "TSH", "T4TOTAL", "FREET4", "T3FREE", "THYROIDAB" in the last 72 hours. Anemia Panel: No results for input(s): "VITAMINB12", "FOLATE", "FERRITIN", "TIBC", "IRON", "RETICCTPCT" in the last 72 hours. Sepsis Labs: No results for  input(s): "PROCALCITON", "LATICACIDVEN" in the last 168 hours.  Recent Results (from the past 240 hours)  Resp panel by RT-PCR (RSV, Flu A&B, Covid) Anterior Nasal Swab     Status: None   Collection Time: 10/12/23  2:53 PM   Specimen: Anterior Nasal Swab  Result Value Ref Range Status   SARS Coronavirus 2 by RT PCR NEGATIVE NEGATIVE Final    Comment: (NOTE) SARS-CoV-2 target nucleic acids are NOT DETECTED.  The SARS-CoV-2 RNA is generally detectable in upper respiratory specimens during the acute phase of infection. The lowest concentration of SARS-CoV-2 viral copies this assay can detect is 138 copies/mL. A negative result does not preclude SARS-Cov-2 infection and should not be used as the sole basis for treatment or other patient management  decisions. A negative result may occur with  improper specimen collection/handling, submission of specimen other than nasopharyngeal swab, presence of viral mutation(s) within the areas targeted by this assay, and inadequate number of viral copies(<138 copies/mL). A negative result must be combined with clinical observations, patient history, and epidemiological information. The expected result is Negative.  Fact Sheet for Patients:  BloggerCourse.com  Fact Sheet for Healthcare Providers:  SeriousBroker.it  This test is no t yet approved or cleared by the [place] FDA and  has been authorized for detection and/or diagnosis of SARS-CoV-2 by FDA under an Emergency Use Authorization (EUA). This EUA will remain  in effect (meaning this test can be used) for the duration of the COVID-19 declaration under Section 564(b)(1) of the Act, 21 U.S.C.section 360bbb-3(b)(1), unless the authorization is terminated  or revoked sooner.       Influenza A by PCR NEGATIVE NEGATIVE Final   Influenza B by PCR NEGATIVE NEGATIVE Final    Comment: (NOTE) The Xpert Xpress SARS-CoV-2/FLU/RSV plus assay is intended as an aid in the diagnosis of influenza from Nasopharyngeal swab specimens and should not be used as a sole basis for treatment. Nasal washings and aspirates are unacceptable for Xpert Xpress SARS-CoV-2/FLU/RSV testing.  Fact Sheet for Patients: BloggerCourse.com  Fact Sheet for Healthcare Providers: SeriousBroker.it  This test is not yet approved or cleared by the [place] FDA and has been authorized for detection and/or diagnosis of SARS-CoV-2 by FDA under an Emergency Use Authorization (EUA). This EUA will remain in effect (meaning this test can be used) for the duration of the COVID-19 declaration under Section 564(b)(1) of the Act, 21 U.S.C. section 360bbb-3(b)(1), unless the  authorization is terminated or revoked.     Resp Syncytial Virus by PCR NEGATIVE NEGATIVE Final    Comment: (NOTE) Fact Sheet for Patients: BloggerCourse.com  Fact Sheet for Healthcare Providers: SeriousBroker.it  This test is not yet approved or cleared by the [place] FDA and has been authorized for detection and/or diagnosis of SARS-CoV-2 by FDA under an Emergency Use Authorization (EUA). This EUA will remain in effect (meaning this test can be used) for the duration of the COVID-19 declaration under Section 564(b)(1) of the Act, 21 U.S.C. section 360bbb-3(b)(1), unless the authorization is terminated or revoked.  Performed at [facility], [address] [place], [place] [address]   Respiratory (~20 pathogens) panel by PCR     Status: None   Collection Time: 10/12/23  2:53 PM   Specimen: Nasopharyngeal Swab; Respiratory  Result Value Ref Range Status   Adenovirus NOT DETECTED NOT DETECTED Final   Coronavirus 229E NOT DETECTED NOT DETECTED Final    Comment: (NOTE) The Coronavirus on the Respiratory Panel, DOES NOT  test for the novel  Coronavirus (2019 nCoV)    Coronavirus HKU1 NOT DETECTED NOT DETECTED Final   Coronavirus NL63 NOT DETECTED NOT DETECTED Final   Coronavirus OC43 NOT DETECTED NOT DETECTED Final   Metapneumovirus NOT DETECTED NOT DETECTED Final   Rhinovirus / Enterovirus NOT DETECTED NOT DETECTED Final   Influenza A NOT DETECTED NOT DETECTED Final   Influenza B NOT DETECTED NOT DETECTED Final   Parainfluenza Virus 1 NOT DETECTED NOT DETECTED Final   Parainfluenza Virus 2 NOT DETECTED NOT DETECTED Final   Parainfluenza Virus 3 NOT DETECTED NOT DETECTED Final   Parainfluenza Virus 4 NOT DETECTED NOT DETECTED Final   Respiratory Syncytial Virus NOT DETECTED NOT DETECTED Final   Bordetella pertussis NOT DETECTED NOT DETECTED Final   Bordetella Parapertussis NOT DETECTED NOT DETECTED  Final   Chlamydophila pneumoniae NOT DETECTED NOT DETECTED Final   Mycoplasma pneumoniae NOT DETECTED NOT DETECTED Final    Comment: Performed at [facility], [address], [place], [place] [address]         Radiology Studies: DG Chest Portable 1 View Result Date: 10/12/2023 CLINICAL DATA:  Weakness and cough. EXAM: PORTABLE CHEST 1 VIEW COMPARISON:  Chest radiograph dated November 25, 2022. FINDINGS: Limited exam due to suboptimal positioning/patient rotation. Stable borderline cardiomegaly. Aortic atherosclerosis. No appreciable focal consolidation, large pleural effusion, or pneumothorax. The right costophrenic angle is not included within the field of view. No acute osseous abnormality identified. IMPRESSION: 1. No acute cardiopulmonary findings. 2. Borderline cardiomegaly. Electronically Signed   By: Hart Robinsons M.D.   On: 10/12/2023 17:04        Scheduled Meds:  arformoterol  15 mcg Nebulization BID   budesonide (PULMICORT) nebulizer solution  0.5 mg Nebulization BID   busPIRone  5 mg Oral TID   donepezil  5 mg Oral Daily   enoxaparin (LOVENOX) injection  40 mg Subcutaneous Q24H   escitalopram  10 mg Oral Daily   folic acid  1 mg Oral Daily   furosemide  20 mg Intravenous Once   ipratropium-albuterol  3 mL Nebulization TID   memantine  10 mg Oral BID   montelukast  10 mg Oral q morning   pravastatin  10 mg Oral Daily   predniSONE  40 mg Oral Q breakfast   traZODone  100 mg Oral QHS   Continuous Infusions:   LOS: 0 days    Time spent: 54 minutes spent on chart review, discussion with nursing staff, consultants, updating family and interview/physical exam; more than 50% of that time was spent in counseling and/or coordination of care.    Alvira Philips [place], DO [facility] Available via Epic secure chat 7am-7pm After these hours, please refer to coverage provider listed on [email or web address] 10/14/2023, 12:01 PM

## 2023-10-14 NOTE — NC FL2 (Signed)
[place] MEDICAID FL2 LEVEL OF CARE FORM     IDENTIFICATION  Patient Name: Kathy Skinner Birthdate: 05/09/1946 Sex: female Admission Date (Current Location): 10/12/2023  [facility] and [place] Number:  Producer, television/film/video and Address:  [facility],  [address], [place] [address]      Provider Number: [id number]  Attending Physician Name and Address:  [place], Eric J, DO  Relative Name and Phone Number:  Thayer Ohm (Daughter)  [phone]    Current Level of Care: Hospital Recommended Level of Care: Memory Care Prior Approval Number:    Date Approved/Denied:   PASRR Number:    Discharge Plan: Other (Comment) [facility]-memory care)    Current Diagnoses: Patient Active Problem List   Diagnosis Date Noted   COPD with acute exacerbation (HCC) 10/12/2023   Acute on chronic respiratory failure with hypoxia (HCC) 10/12/2023   OP (osteoporosis) 01/19/2023   Obesity (BMI 30-39.9) 11/26/2022   Dizziness 11/26/2022   Mild TBI (HCC) 11/25/2022   Dementia (HCC) 11/25/2022   MDD (major depressive disorder) 02/04/2019   Tobacco use 07/08/2018   Chronic respiratory failure (HCC) 11/04/2016   GERD (gastroesophageal reflux disease) 12/17/2015   Hypersomnolence 02/13/2015   COPD GOLD D 02/03/2013   Allergic rhinitis 02/03/2013   Generalized anxiety disorder 02/03/2013    Orientation RESPIRATION BLADDER Height & Weight     Self  O2 Continent Weight: 66.9 kg Height:  5\' 1"  (154.9 cm)  BEHAVIORAL SYMPTOMS/MOOD NEUROLOGICAL BOWEL NUTRITION STATUS      Continent Diet (Heart Healthy)  AMBULATORY STATUS COMMUNICATION OF NEEDS Skin   Limited Assist Verbally Normal                       Personal Care Assistance Level of Assistance  Bathing, Feeding, Dressing Bathing Assistance: Limited assistance Feeding assistance: Limited assistance Dressing Assistance: Limited assistance     Functional Limitations Info  Sight, Hearing, Speech   Hearing Info:  Adequate Speech Info: Impaired (Dentures-top)    SPECIAL CARE FACTORS FREQUENCY                       Contractures Contractures Info: Not present    Additional Factors Info  Code Status, Allergies Code Status Info: DNR Allergies Info: Neurontin (Gabapentin), Tramadol, Codeine           Current Medications (10/14/2023):  This is the current hospital active medication list Current Facility-Administered Medications  Medication Dose Route Frequency Provider Last Rate Last Admin   acetaminophen (TYLENOL) tablet 650 mg  650 mg Oral Q6H PRN Bobette Mo, MD       Or   acetaminophen (TYLENOL) suppository 650 mg  650 mg Rectal Q6H PRN Bobette Mo, MD       albuterol (PROVENTIL) (2.5 MG/3ML) 0.083% nebulizer solution 2.5 mg  2.5 mg Nebulization Q4H PRN Bobette Mo, MD       arformoterol [facility]) nebulizer solution 15 mcg  15 mcg Nebulization BID [place], Eric J, DO   15 mcg at 10/14/23 0941   budesonide (PULMICORT) nebulizer solution 0.5 mg  0.5 mg Nebulization BID [place], Eric J, DO   0.5 mg at 10/14/23 7846   busPIRone (BUSPAR) tablet 5 mg  5 mg Oral TID Bobette Mo, MD   5 mg at 10/14/23 0900   donepezil (ARICEPT) tablet 5 mg  5 mg Oral Daily Bobette Mo, MD   5 mg at 10/13/23 2125   enoxaparin (LOVENOX) injection  40 mg  40 mg Subcutaneous Q24H Bobette Mo, MD   40 mg at 10/13/23 2124   escitalopram (LEXAPRO) tablet 10 mg  10 mg Oral Daily Bobette Mo, MD   10 mg at 10/14/23 0900   folic acid (FOLVITE) tablet 1 mg  1 mg Oral Daily Bobette Mo, MD   1 mg at 10/14/23 0900   ipratropium-albuterol (DUONEB) 0.5-2.5 (3) MG/3ML nebulizer solution 3 mL  3 mL Nebulization TID [place], Eric J, DO   3 mL at 10/14/23 1428   memantine (NAMENDA) tablet 10 mg  10 mg Oral BID Bobette Mo, MD   10 mg at 10/14/23 0900   methylPREDNISolone sodium succinate (SOLU-MEDROL) 40 mg/mL injection 40 mg  40 mg Intravenous BID [place],  Eric J, DO   40 mg at 10/14/23 1249   montelukast (SINGULAIR) tablet 10 mg  10 mg Oral q morning Bobette Mo, MD   10 mg at 10/14/23 0900   ondansetron (ZOFRAN) tablet 4 mg  4 mg Oral Q6H PRN Bobette Mo, MD       Or   ondansetron [facility]) injection 4 mg  4 mg Intravenous Q6H PRN Bobette Mo, MD       polyethylene glycol [facility]) packet 17 g  17 g Oral Daily PRN Bobette Mo, MD       pravastatin (PRAVACHOL) tablet 10 mg  10 mg Oral Daily Bobette Mo, MD   10 mg at 10/14/23 0900   traZODone (DESYREL) tablet 100 mg  100 mg Oral QHS Bobette Mo, MD   100 mg at 10/13/23 2124     Discharge Medications: Please see discharge summary for a list of discharge medications.  Relevant Imaging Results:  Relevant Lab Results:   Additional Information ss#[id number]  Shailey Butterbaugh, Olegario Messier, [place]

## 2023-10-14 NOTE — Evaluation (Addendum)
Physical Therapy Evaluation Patient Details Name: Kathy Skinner MRN: [id number] DOB: [birth year] Today's Date: 10/14/2023  History of Present Illness  78 yo female admitted with acute on chronic respiratory failure. PMH: anxiety, panic attacks, osteoporosis,COPD, chronic respiratory failure, dementia, vertigo  Clinical Impression  On eval, pt required Min A for mobility. She was able to stand and step over to recliner using a RW. Pt c/o dizziness/spinning during session-"I've been dizzy for so long"- did not observe any nystagmus. Lunch tray in room but pt declined stating she wasn't hungry;only interested in drinking water/tea. No family present during session. Recommend return to ALF/memory care once medically ready.         If plan is discharge home, recommend the following: A little help with walking and/or transfers;A lot of help with walking and/or transfers;Direct supervision/assist for medications management;Assist for transportation;Direct supervision/assist for financial management   Can travel by private vehicle        Equipment Recommendations None recommended by PT  Recommendations for Other Services       Functional Status Assessment Patient has had a recent decline in their functional status and demonstrates the ability to make significant improvements in function in a reasonable and predictable amount of time.     Precautions / Restrictions Precautions Precautions: Fall Precaution/Restrictions Comments: monitor O2 Restrictions Weight Bearing Restrictions Per Provider Order: No      Mobility  Bed Mobility Overal bed mobility: Needs Assistance Bed Mobility: Supine to Sit     Supine to sit: Min assist     General bed mobility comments: Increased time. Assist for trunk-pt propping on L elbow against elevated HOB while sitting EOB.    Transfers Overall transfer level: Needs assistance Equipment used: Rolling walker (2 wheels) Transfers: Sit to/from Stand, Bed  to chair/wheelchair/BSC Sit to Stand: Min assist   Step pivot transfers: Min assist       General transfer comment: x 2. Assist to rise, steady, control descent. Cues for safety, hand placement. Pt c/o dizziness    Ambulation/Gait                  Stairs            Wheelchair Mobility     Tilt Bed    Modified Rankin (Stroke Patients Only)       Balance Overall balance assessment: Needs assistance         Standing balance support: Reliant on assistive device for balance Standing balance-Leahy Scale: Poor                               Pertinent Vitals/Pain Pain Assessment Pain Assessment: No/denies pain    Home Living Family/patient expects to be discharged to:: Assisted living (memory care)                   Additional Comments: [facility] memory care    Prior Function Prior Level of Function : Patient poor historian/Family not available                     Extremity/Trunk Assessment   Upper Extremity Assessment Upper Extremity Assessment: Defer to OT evaluation    Lower Extremity Assessment Lower Extremity Assessment: Generalized weakness    Cervical / Trunk Assessment Cervical / Trunk Assessment: Normal  Communication   Communication Communication: No apparent difficulties    Cognition Arousal: Alert Behavior During Therapy: WFL for tasks assessed/performed   PT -  Cognitive impairments: Memory, History of cognitive impairments                         Following commands: Impaired Following commands impaired: Only follows one step commands consistently     Cueing Cueing Techniques: Verbal cues     General Comments      Exercises     Assessment/Plan    PT Assessment Patient needs continued PT services  PT Problem List Decreased strength;Decreased range of motion;Decreased activity tolerance;Decreased balance;Decreased mobility;Decreased knowledge of use of DME;Decreased  cognition;Decreased safety awareness       PT Treatment Interventions DME instruction;Gait training;Functional mobility training;Therapeutic activities;Balance training;Therapeutic exercise;Patient/family education    PT Goals (Current goals can be found in the Care Plan section)  Acute Rehab PT Goals Patient Stated Goal: none stated PT Goal Formulation: Patient unable to participate in goal setting Time For Goal Achievement: 10/28/23 Potential to Achieve Goals: Fair    Frequency Min 1X/week     Co-evaluation               AM-PAC PT "6 Clicks" Mobility  Outcome Measure Help needed turning from your back to your side while in a flat bed without using bedrails?: A Little Help needed moving from lying on your back to sitting on the side of a flat bed without using bedrails?: A Little Help needed moving to and from a bed to a chair (including a wheelchair)?: A Little Help needed standing up from a chair using your arms (e.g., wheelchair or bedside chair)?: A Little Help needed to walk in hospital room?: A Little Help needed climbing 3-5 steps with a railing? : A Lot 6 Click Score: 17    End of Session   Activity Tolerance: Patient tolerated treatment well Patient left: in chair;with call bell/phone within reach;with chair alarm set   PT Visit Diagnosis: Muscle weakness (generalized) (M62.81);Difficulty in walking, not elsewhere classified (R26.2);History of falling (Z91.81)    Time: 1610-9604 PT Time Calculation (min) (ACUTE ONLY): 20 min   Charges:   PT Evaluation $PT Eval Low Complexity: 1 Low   PT General Charges $$ ACUTE PT VISIT: 1 Visit           Faye Ramsay, PT Acute Rehabilitation  Office: [phone]

## 2023-10-14 NOTE — Progress Notes (Signed)
Pt transferred to 3E form 4E.

## 2023-10-14 NOTE — TOC Initial Note (Addendum)
Transition of Care [facility]) - Initial/Assessment Note    Patient Details  Name: Kathy Skinner MRN: [id number] Date of Birth: 09-Jun-1946  Transition [facility] Care [facility]) CM/SW Contact:    Lanier Clam, RN Phone Number: 10/14/2023, 4:19 PM  Clinical Narrative:  Sherron Monday to dtr Susie-return back to [facility] care;rep [facility] resident care coordinator [phone].await signature of fl2 prior faxing to [facility].  HHPT facility has contract.               Expected Discharge Plan: Memory Care Barriers to Discharge: Continued Medical Work up   Patient Goals and CMS Choice Patient states their goals for this hospitalization and ongoing recovery are:: [facility]-memory care CMS Medicare.gov Compare Post Acute Care list provided to:: Patient Represenative (must comment) (Susie(dtr)) Choice offered to / list presented to : Adult Children [place] ownership interest in [facility].provided to:: Adult Children    Expected Discharge Plan and Services   Discharge Planning Services: CM Consult Post Acute Care Choice:  (memory care/HHPT)                                        Prior Living Arrangements/Services   Lives with:: Facility Resident   Do you feel safe going back to the place where you live?: Yes          Current home services: DME (rw)    Activities of Daily Living   ADL Screening (condition at time of admission) Independently performs ADLs?: No Does the patient have a NEW difficulty with bathing/dressing/toileting/self-feeding that is expected to last >3 days?: No Does the patient have a NEW difficulty with getting in/out of bed, walking, or climbing stairs that is expected to last >3 days?: No Does the patient have a NEW difficulty with communication that is expected to last >3 days?: No Is the patient deaf or have difficulty hearing?: No Does the patient have difficulty seeing, even when wearing glasses/contacts?: No Does the  patient have difficulty concentrating, remembering, or making decisions?: Yes  Permission Sought/Granted Permission sought to share information with : Case Manager                Emotional Assessment              Admission diagnosis:  COPD exacerbation (HCC) [J44.1] Acute respiratory failure with hypoxia (HCC) [J96.01] Acute hypoxic respiratory failure (HCC) [J96.01] Patient Active Problem List   Diagnosis Date Noted   COPD with acute exacerbation (HCC) 10/12/2023   Acute on chronic respiratory failure with hypoxia (HCC) 10/12/2023   OP (osteoporosis) 01/19/2023   Obesity (BMI 30-39.9) 11/26/2022   Dizziness 11/26/2022   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 - [place], [place] - [address], [address] [place] [address] Phone: [phone] Fax: [phone]     Social Drivers of Health (SDOH) Social History: SDOH Screenings   Food Insecurity: No Food Insecurity (10/13/2023)  Housing: Low Risk  (  10/13/2023)  Transportation Needs: No Transportation Needs (10/13/2023)  Utilities: Not At Risk (10/13/2023)  Social Connections: Patient Unable To Answer (10/13/2023)  Tobacco Use: Medium Risk (10/12/2023)   SDOH Interventions:     Readmission Risk Interventions     No data to display

## 2023-10-14 NOTE — NC FL2 (Signed)
MEDICAID FL2 LEVEL OF CARE FORM     IDENTIFICATION  Patient Name: Kathy Skinner Birthdate: Sep 27, 1945 Sex: female Admission Date (Current Location): 10/12/2023  [facility] and [place] Number:  Producer, television/film/video and Address:  [facility]  [address], [place] [address]      Provider Number: [id number]  Attending Physician Name and Address:  [place], Eric J, DO  Relative Name and Phone Number:  Thayer Ohm (Daughter)  [phone]    Current Level of Care: Hospital Recommended Level of Care: Memory Care Prior Approval Number:    Date Approved/Denied:   PASRR Number:    Discharge Plan: Other (Comment) [facility]-memory care)    Current Diagnoses: Patient Active Problem List   Diagnosis Date Noted   COPD with acute exacerbation (HCC) 10/12/2023   Acute on chronic respiratory failure with hypoxia (HCC) 10/12/2023   OP (osteoporosis) 01/19/2023   Obesity (BMI 30-39.9) 11/26/2022   Dizziness 11/26/2022   Mild TBI (HCC) 11/25/2022   Dementia (HCC) 11/25/2022   MDD (major depressive disorder) 02/04/2019   Tobacco use 07/08/2018   Chronic respiratory failure (HCC) 11/04/2016   GERD (gastroesophageal reflux disease) 12/17/2015   Hypersomnolence 02/13/2015   COPD GOLD D 02/03/2013   Allergic rhinitis 02/03/2013   Generalized anxiety disorder 02/03/2013    Orientation RESPIRATION BLADDER Height & Weight     Self  Normal Continent Weight: 66.9 kg Height:  5\' 1"  (154.9 cm)  BEHAVIORAL SYMPTOMS/MOOD NEUROLOGICAL BOWEL NUTRITION STATUS      Continent Diet (Heart Healthy)  AMBULATORY STATUS COMMUNICATION OF NEEDS Skin   Limited Assist Verbally Normal                       Personal Care Assistance Level of Assistance  Bathing, Feeding, Dressing Bathing Assistance: Limited assistance Feeding assistance: Limited assistance Dressing Assistance: Limited assistance     Functional Limitations Info  Sight, Hearing, Speech   Hearing  Info: Adequate Speech Info: Impaired (Dentures-top)    SPECIAL CARE FACTORS FREQUENCY                       Contractures Contractures Info: Not present    Additional Factors Info  Code Status, Allergies Code Status Info: DNR Allergies Info: Neurontin (Gabapentin), Tramadol, Codeine           Current Medications (10/14/2023):  This is the current hospital active medication list Current Facility-Administered Medications  Medication Dose Route Frequency Provider Last Rate Last Admin   acetaminophen (TYLENOL) tablet 650 mg  650 mg Oral Q6H PRN Bobette Mo, MD       Or   acetaminophen (TYLENOL) suppository 650 mg  650 mg Rectal Q6H PRN Bobette Mo, MD       albuterol (PROVENTIL) (2.5 MG/3ML) 0.083% nebulizer solution 2.5 mg  2.5 mg Nebulization Q4H PRN Bobette Mo, MD       arformoterol [facility]) nebulizer solution 15 mcg  15 mcg Nebulization BID [place], Eric J, DO   15 mcg at 10/14/23 0941   budesonide (PULMICORT) nebulizer solution 0.5 mg  0.5 mg Nebulization BID [place], Eric J, DO   0.5 mg at 10/14/23 5621   busPIRone (BUSPAR) tablet 5 mg  5 mg Oral TID Bobette Mo, MD   5 mg at 10/14/23 0900   donepezil (ARICEPT) tablet 5 mg  5 mg Oral Daily Bobette Mo, MD   5 mg at 10/13/23 2125   enoxaparin (LOVENOX) injection  40 mg  40 mg Subcutaneous Q24H Bobette Mo, MD   40 mg at 10/13/23 2124   escitalopram (LEXAPRO) tablet 10 mg  10 mg Oral Daily Bobette Mo, MD   10 mg at 10/14/23 0900   folic acid (FOLVITE) tablet 1 mg  1 mg Oral Daily Bobette Mo, MD   1 mg at 10/14/23 0900   ipratropium-albuterol (DUONEB) 0.5-2.5 (3) MG/3ML nebulizer solution 3 mL  3 mL Nebulization TID [place], Eric J, DO   3 mL at 10/14/23 1428   memantine (NAMENDA) tablet 10 mg  10 mg Oral BID Bobette Mo, MD   10 mg at 10/14/23 0900   methylPREDNISolone sodium succinate (SOLU-MEDROL) 40 mg/mL injection 40 mg  40 mg Intravenous BID  [place], Eric J, DO   40 mg at 10/14/23 1249   montelukast (SINGULAIR) tablet 10 mg  10 mg Oral q morning Bobette Mo, MD   10 mg at 10/14/23 0900   ondansetron (ZOFRAN) tablet 4 mg  4 mg Oral Q6H PRN Bobette Mo, MD       Or   ondansetron [facility]) injection 4 mg  4 mg Intravenous Q6H PRN Bobette Mo, MD       polyethylene glycol [facility]) packet 17 g  17 g Oral Daily PRN Bobette Mo, MD       pravastatin (PRAVACHOL) tablet 10 mg  10 mg Oral Daily Bobette Mo, MD   10 mg at 10/14/23 0900   traZODone (DESYREL) tablet 100 mg  100 mg Oral QHS Bobette Mo, MD   100 mg at 10/13/23 2124     Discharge Medications: Please see discharge summary for a list of discharge medications.  Relevant Imaging Results:  Relevant Lab Results:   Additional Information ss#[id number]  Ashtin Rosner, Olegario Messier, [place]

## 2023-10-14 NOTE — Evaluation (Signed)
Occupational Therapy Evaluation Patient Details Name: Kathy Skinner MRN: [id number] DOB: August 19, 1946 Today's Date: 10/14/2023   History of Present Illness   History of Present Illness: Pt is a 78 yr old female who presents due to increase in weakness and hypoxia. PMH: anxiety, panic attacks, osteoporosis, hypersomnolence, asthma, COPD, chronic respiratory failure     Clinical Impressions Pt at this time presented in bed with nursing care. Pt could not recall name at this time. She was able to follow one step commands inconsistently as she would often say "no thank you." She was able to complete bed mobility with min assist. While at the EOB attempted to engage pt in transfers but declined. Pt then attempted then also engage in self feeding with hand in hand and only took a couple of sips of coffee and juice. Pt then started to go back into bed. At this time also attempted to call daughter to see PLOF but no answer at this time. Will continue to follow as pt refused transfers at this time.     If plan is discharge home, recommend the following:   A lot of help with walking and/or transfers;A lot of help with bathing/dressing/bathroom;Assistance with feeding;Assistance with cooking/housework;Direct supervision/assist for medications management;Direct supervision/assist for financial management;Assist for transportation;Supervision due to cognitive status     Functional Status Assessment   Patient has had a recent decline in their functional status and demonstrates the ability to make significant improvements in function in a reasonable and predictable amount of time.     Equipment Recommendations   Other (comment) (no family present to determine)     Recommendations for Other Services         Precautions/Restrictions   Precautions Precautions: Fall Restrictions Weight Bearing Restrictions Per Provider Order: No     Mobility Bed Mobility Overal bed mobility: Needs  Assistance Bed Mobility: Supine to Sit     Supine to sit: Min assist          Transfers                   General transfer comment: attempted to transfer but declined and would say no thank you      Balance Overall balance assessment: Needs assistance Sitting-balance support: Feet supported, Single extremity supported Sitting balance-Leahy Scale: Fair                                     ADL either performed or assessed with clinical judgement   ADL Overall ADL's : Needs assistance/impaired Eating/Feeding: Minimal assistance;Sitting;Bed level   Grooming: Wash/dry face;Set up;Sitting   Upper Body Bathing: Minimal assistance;Sitting   Lower Body Bathing: Maximal assistance;Sit to/from stand   Upper Body Dressing : Minimal assistance;Sitting   Lower Body Dressing: Maximal assistance;Sit to/from stand                       Vision         Perception         Praxis         Pertinent Vitals/Pain Pain Assessment Pain Assessment: No/denies pain     Extremity/Trunk Assessment Upper Extremity Assessment Upper Extremity Assessment: Overall WFL for tasks assessed;Generalized weakness   Lower Extremity Assessment Lower Extremity Assessment: Defer to PT evaluation       Communication Communication Communication: No apparent difficulties   Cognition Arousal: Alert Behavior During Therapy: [facility] for tasks  assessed/performed Cognition: History of cognitive impairments, No family/caregiver present to determine baseline             OT - Cognition Comments: unclear on baselinee of cognition but could not even report name                 Following commands: Impaired Following commands impaired: Only follows one step commands consistently     Cueing  General Comments   Cueing Techniques: Verbal cues      Exercises     Shoulder Instructions      Home Living Family/patient expects to be discharged to:: Assisted  living (memory care)                                 Additional Comments: pt lives at [facility] memory care but unclear on PLOF      Prior Functioning/Environment Prior Level of Function : Patient poor historian/Family not available                    OT Problem List: Decreased strength;Decreased activity tolerance;Impaired balance (sitting and/or standing);Decreased safety awareness;Decreased knowledge of use of DME or AE   OT Treatment/Interventions: Self-care/ADL training;Therapeutic exercise;DME and/or AE instruction;Therapeutic activities;Patient/family education;Balance training      OT Goals(Current goals can be found in the care plan section)   Acute Rehab OT Goals Patient Stated Goal: none reported OT Goal Formulation: With patient Time For Goal Achievement: 10/28/23 Potential to Achieve Goals: Fair   OT Frequency:  Min 1X/week    Co-evaluation              AM-PAC OT "6 Clicks" Daily Activity     Outcome Measure Help from another person eating meals?: A Little Help from another person taking care of personal grooming?: A Little Help from another person toileting, which includes using toliet, bedpan, or urinal?: A Lot Help from another person bathing (including washing, rinsing, drying)?: A Lot Help from another person to put on and taking off regular upper body clothing?: A Little Help from another person to put on and taking off regular lower body clothing?: A Lot 6 Click Score: 15   End of Session Equipment Utilized During Treatment: Gait belt Nurse Communication: Mobility status  Activity Tolerance: Other (comment) (Pt decliing to complete further activity) Patient left: in bed;with call bell/phone within reach;with bed alarm set  OT Visit Diagnosis: Unsteadiness on feet (R26.81);Other abnormalities of gait and mobility (R26.89);Repeated falls (R29.6);Muscle weakness (generalized) (M62.81)                Time: 1610-9604 OT  Time Calculation (min): 24 min Charges:  OT General Charges $OT Visit: 1 Visit OT Evaluation $OT Eval Low Complexity: 1 Low OT Treatments $Self Care/Home Management : 8-22 mins  Presley Raddle OTR/L  Acute Rehab Services  [phone] office number   Alphia Moh 10/14/2023, 9:43 AM

## 2023-10-15 DIAGNOSIS — R627 Adult failure to thrive: Secondary | ICD-10-CM | POA: Diagnosis present

## 2023-10-15 DIAGNOSIS — J189 Pneumonia, unspecified organism: Secondary | ICD-10-CM | POA: Diagnosis present

## 2023-10-15 DIAGNOSIS — R531 Weakness: Secondary | ICD-10-CM | POA: Diagnosis present

## 2023-10-15 DIAGNOSIS — F411 Generalized anxiety disorder: Secondary | ICD-10-CM | POA: Diagnosis present

## 2023-10-15 DIAGNOSIS — J9601 Acute respiratory failure with hypoxia: Secondary | ICD-10-CM | POA: Diagnosis present

## 2023-10-15 DIAGNOSIS — Z79899 Other long term (current) drug therapy: Secondary | ICD-10-CM | POA: Diagnosis not present

## 2023-10-15 DIAGNOSIS — Z7951 Long term (current) use of inhaled steroids: Secondary | ICD-10-CM | POA: Diagnosis not present

## 2023-10-15 DIAGNOSIS — K219 Gastro-esophageal reflux disease without esophagitis: Secondary | ICD-10-CM | POA: Diagnosis present

## 2023-10-15 DIAGNOSIS — Z1152 Encounter for screening for COVID-19: Secondary | ICD-10-CM | POA: Diagnosis not present

## 2023-10-15 DIAGNOSIS — J9621 Acute and chronic respiratory failure with hypoxia: Secondary | ICD-10-CM | POA: Diagnosis present

## 2023-10-15 DIAGNOSIS — F03A0 Unspecified dementia, mild, without behavioral disturbance, psychotic disturbance, mood disturbance, and anxiety: Secondary | ICD-10-CM | POA: Diagnosis not present

## 2023-10-15 DIAGNOSIS — Z7401 Bed confinement status: Secondary | ICD-10-CM | POA: Diagnosis not present

## 2023-10-15 DIAGNOSIS — Z743 Need for continuous supervision: Secondary | ICD-10-CM | POA: Diagnosis not present

## 2023-10-15 DIAGNOSIS — Z66 Do not resuscitate: Secondary | ICD-10-CM | POA: Diagnosis not present

## 2023-10-15 DIAGNOSIS — R059 Cough, unspecified: Secondary | ICD-10-CM | POA: Diagnosis not present

## 2023-10-15 DIAGNOSIS — R64 Cachexia: Secondary | ICD-10-CM | POA: Diagnosis present

## 2023-10-15 DIAGNOSIS — F0393 Unspecified dementia, unspecified severity, with mood disturbance: Secondary | ICD-10-CM | POA: Diagnosis present

## 2023-10-15 DIAGNOSIS — E876 Hypokalemia: Secondary | ICD-10-CM | POA: Diagnosis present

## 2023-10-15 DIAGNOSIS — I7121 Aneurysm of the ascending aorta, without rupture: Secondary | ICD-10-CM | POA: Diagnosis not present

## 2023-10-15 DIAGNOSIS — J441 Chronic obstructive pulmonary disease with (acute) exacerbation: Secondary | ICD-10-CM | POA: Diagnosis present

## 2023-10-15 DIAGNOSIS — E119 Type 2 diabetes mellitus without complications: Secondary | ICD-10-CM | POA: Diagnosis present

## 2023-10-15 DIAGNOSIS — E785 Hyperlipidemia, unspecified: Secondary | ICD-10-CM | POA: Diagnosis present

## 2023-10-15 DIAGNOSIS — E43 Unspecified severe protein-calorie malnutrition: Secondary | ICD-10-CM | POA: Diagnosis present

## 2023-10-15 DIAGNOSIS — I700 Atherosclerosis of aorta: Secondary | ICD-10-CM | POA: Diagnosis present

## 2023-10-15 DIAGNOSIS — J440 Chronic obstructive pulmonary disease with acute lower respiratory infection: Secondary | ICD-10-CM | POA: Diagnosis present

## 2023-10-15 DIAGNOSIS — J9690 Respiratory failure, unspecified, unspecified whether with hypoxia or hypercapnia: Secondary | ICD-10-CM | POA: Diagnosis not present

## 2023-10-15 DIAGNOSIS — F329 Major depressive disorder, single episode, unspecified: Secondary | ICD-10-CM | POA: Diagnosis present

## 2023-10-15 DIAGNOSIS — R339 Retention of urine, unspecified: Secondary | ICD-10-CM | POA: Diagnosis not present

## 2023-10-15 DIAGNOSIS — I5033 Acute on chronic diastolic (congestive) heart failure: Secondary | ICD-10-CM | POA: Diagnosis not present

## 2023-10-15 DIAGNOSIS — J439 Emphysema, unspecified: Secondary | ICD-10-CM | POA: Diagnosis present

## 2023-10-15 DIAGNOSIS — I2510 Atherosclerotic heart disease of native coronary artery without angina pectoris: Secondary | ICD-10-CM | POA: Diagnosis not present

## 2023-10-15 DIAGNOSIS — F0394 Unspecified dementia, unspecified severity, with anxiety: Secondary | ICD-10-CM | POA: Diagnosis present

## 2023-10-15 LAB — CBC
HCT: 45.4 % (ref 36.0–46.0)
Hemoglobin: 14.5 g/dL (ref 12.0–15.0)
MCH: 30.9 pg (ref 26.0–34.0)
MCHC: 31.9 g/dL (ref 30.0–36.0)
MCV: 96.6 fL (ref 80.0–100.0)
Platelets: 264 10*3/uL (ref 150–400)
RBC: 4.7 MIL/uL (ref 3.87–5.11)
RDW: 12.8 % (ref 11.5–15.5)
WBC: 8.6 10*3/uL (ref 4.0–10.5)
nRBC: 0 % (ref 0.0–0.2)

## 2023-10-15 LAB — BRAIN NATRIURETIC PEPTIDE: B Natriuretic Peptide: 140.7 pg/mL — ABNORMAL HIGH (ref 0.0–100.0)

## 2023-10-15 LAB — BASIC METABOLIC PANEL
Anion gap: 10 (ref 5–15)
BUN: 16 mg/dL (ref 8–23)
CO2: 28 mmol/L (ref 22–32)
Calcium: 8 mg/dL — ABNORMAL LOW (ref 8.9–10.3)
Chloride: 101 mmol/L (ref 98–111)
Creatinine, Ser: 0.35 mg/dL — ABNORMAL LOW (ref 0.44–1.00)
GFR, Estimated: >60 mL/min (ref >60)
Glucose, Bld: 134 mg/dL — ABNORMAL HIGH (ref 70–99)
Potassium: 4.3 mmol/L (ref 3.5–5.1)
Sodium: 139 mmol/L (ref 135–145)

## 2023-10-15 LAB — MAGNESIUM: Magnesium: 2.7 mg/dL — ABNORMAL HIGH (ref 1.7–2.4)

## 2023-10-15 LAB — MRSA NEXT GEN BY PCR, NASAL: MRSA by PCR Next Gen: DETECTED — AB

## 2023-10-15 MED ORDER — FUROSEMIDE 10 MG/ML IJ SOLN
40.0000 mg | Freq: Two times a day (BID) | INTRAMUSCULAR | Status: DC
  Administered 2023-10-15 – 2023-10-16 (×3): 40 mg via INTRAVENOUS
  Filled 2023-10-15 (×3): qty 4

## 2023-10-15 NOTE — Progress Notes (Signed)
PROGRESS NOTE    Kathy Skinner  ZOX:[id number] DOB: 08-28-1946 DOA: 10/12/2023 PCP: Laurell Josephs, MD (Inactive)    Brief Narrative:   Kathy Skinner is a 78 y.o. female with past medical history significant for dementia, asthma/COPD, anxiety/panic attacks, osteoporosis who presents to [facility] ED on 2/10 from [facility] memory care unit with weakness, cough and was noted to desaturate to 80% on room air.  Apparently there has been a flu outbreak in the facility.  Patient denied headache, no chest pain, no abdominal pain, no nausea/vomiting/diarrhea.  EMS was activated and patient was brought to the ED for further evaluation and management.  In the ED, temperature 98.7 F, HR 80, RR 18, BP 129/73, SpO2 98% room air.  WBC 6.3, hemoglobin 15.2, platelet count 237.  Sodium 143, potassium 3.2, chloride 102, CO2 29, glucose 112, BUN 13, creatinine 0.47.  AST 17, ALT 14, total bilirubin 1.2.  BNP 129.1.  COVID/influenza/RSV PCR negative.  Respiratory viral panel negative.  Chest x-ray with no acute cardiopulmonary disease process.  Patient received DuoNeb, Solu-Medrol 125 mg IV x 1.  TRH consulted for admission for further evaluation and management of acute hypoxic respite failure secondary to COPD exacerbation.  Assessment & Plan:   Acute hypoxic respiratory failure COPD exacerbation Patient presenting to ED after being found hypoxic at facility with SpO2 80% on room air and placed on supplemental oxygen.  Also has been having generalized weakness over the last several days with known influenza outbreak at the facility; although patient negative for influenza A/B, negative for COVID/RSV and respiratory viral panel were negative.  Chest x-ray with no acute cardiopulmonary disease process. -- Brovana neb twice daily -- Pulmicort neb twice daily -- Solu-Medrol 40 mg IV every 12 hours -- Singulair 10 mg p.o. daily -- DuoNebs scheduled every 6 hours -- Continue supplemental oxygen,  maintain SpO2 > 88%; currently on 5 L [place] with SpO2 96% at rest -- Ambulatory O2 screen   Acute on chronic diastolic congestive heart failure BNP elevated on admission 129.1.  TTE with LVEF 65 to 70%, grade 1 diastolic dysfunction, LA mildly dilated. -- Lasix 40 mg IV q12h -- Strict I's and O's and daily weights -- BMP/BNP daily  Concern for intermittent urinary retention RN reports intermittent urinary retention, patient has been urinating independently. -- Continue to monitor urine output closely, bladder scan PRN  Hypokalemia Repleted.  Potassium 4.3 this morning -- Repeat electrolytes in a.m. to include magnesium  Anxiety/panic attacks -- Lexapro 10 mg p.o. daily -- BuSpar 5 mg p.o. 3 times daily  Hyperlipidemia -- Pravastatin 10 mg p.o. daily  Dementia -- Delirium precautions -- Get up during the day -- Encourage a familiar face to remain present throughout the day -- Keep blinds open and lights on during daylight hours -- Minimize the use of opioids/benzodiazepines -- Namenda 10 mg p.o. twice daily -- Donepezil 5 mg p.o. daily  Weakness/debility/deconditioning: -- PT/OT evaluation: Recommend home health on return to memory care unit  Ethics: Discussed with patient's daughter, Kathy Skinner via telephone on 10/14/2023 regarding CODE STATUS.  Patient's wishes are to be DNR without CPR, intubation or significant resuscitation as this would be "inhumane" given her advanced age, dementia and osteoporotic status.  CODE STATUS changed to DNR.   DVT prophylaxis: enoxaparin (LOVENOX) injection 40 mg Start: 10/12/23 2200    Code Status: Limited: Do not attempt resuscitation (DNR) -DNR-LIMITED -Do Not Intubate/DNI  Family Communication: No family present at bedside this morning, updated patient's  daughter Kathy Skinner via telephone this morning  Disposition Plan:  Level of care: Med-Surg Status is: Observation The patient remains OBS appropriate and will d/c before 2 midnights.     Consultants:  None  Procedures:  TTE: Pending  Antimicrobials:  None   Subjective: Patient seen examined bedside, resting calmly.  Lying in bed.  Pleasantly confused.  No family present at bedside.  Remains on 5 L nasal cannula, not oxygen dependent at baseline.  Starting scheduled IV Lasix. No other specific questions, concerns or complaints at this time.  Denies headache, no dizziness, no chest pain, no abdominal pain, no fever, no nausea/vomiting/diarrhea.  RN reports intermittent concern for urinary retention although patient urinating independently, discussed will continue to BladderScan as needed and In-N-Out cath if needed.  Otherwise, no acute events overnight per nurse staff.  Objective: Vitals:   10/14/23 2114 10/15/23 0002 10/15/23 0550 10/15/23 0734  BP:  131/72 118/71   Pulse:  69 (!) 56   Resp:  (!) 21 14   Temp:  98.3 F (36.8 C) 98.4 F (36.9 C)   TempSrc:  Oral    SpO2: 95% 97% 98% 96%  Weight:      Height:        Intake/Output Summary (Last 24 hours) at 10/15/2023 1107 Last data filed at 10/15/2023 1030 Gross per 24 hour  Intake 220 ml  Output 550 ml  Net -330 ml   Filed Weights   10/12/23 1432  Weight: 66.9 kg    Examination:  Physical Exam: GEN: NAD, alert, oriented to self but not to person, place, time or situation, elderly in appearance HEENT: NCAT, PERRL, EOMI, sclera clear, MMM PULM: Breath sounds slight diminished bilateral bases with wheezing, no crackles, normal respiratory effort without accessory muscle use, on 5 L nasal cannula with SpO2's 92% at rest CV: RRR w/o M/G/R GI: abd soft, NTND, NABS, no R/G/M MSK: no peripheral edema, moves lower extremities independently NEURO: No focal neurological deficits PSYCH: normal mood/affect Integumentary: No rashes/lesions/wounds noted on exposed skin surfaces    Data Reviewed: I have personally reviewed following labs and imaging studies  CBC: Recent Labs  Lab 10/12/23 1542  10/13/23 0720 10/15/23 0501  WBC 6.3 4.7 8.6  NEUTROABS 4.9  --   --   HGB 15.2* 14.4 14.5  HCT 46.2* 44.8 45.4  MCV 94.7 94.7 96.6  PLT 237 259 264   Basic Metabolic Panel: Recent Labs  Lab 10/12/23 1542 10/13/23 0602 10/14/23 0536 10/15/23 0501  NA 143 141 134* 139  K 3.2* 2.8* 4.1 4.3  CL 102 104 101 101  CO2 29 27 24 28   GLUCOSE 112* 124* 95 134*  BUN 13 16 21 16   CREATININE 0.47 0.61 0.50 0.35*  CALCIUM 7.9* 7.0* 7.7* 8.0*  MG 2.9*  --  2.8* 2.7*   GFR: Estimated Creatinine Clearance: 53.1 mL/min (A) (by C-G formula based on SCr of 0.35 mg/dL (L)). Liver Function Tests: Recent Labs  Lab 10/12/23 1542 10/13/23 0602  AST 17 15  ALT 14 12  ALKPHOS 78 65  BILITOT 1.2 1.0  PROT 7.8 6.7  ALBUMIN 3.0* 2.5*   No results for input(s): "LIPASE", "AMYLASE" in the last 168 hours. No results for input(s): "AMMONIA" in the last 168 hours. Coagulation Profile: No results for input(s): "INR", "PROTIME" in the last 168 hours. Cardiac Enzymes: No results for input(s): "CKTOTAL", "CKMB", "CKMBINDEX", "TROPONINI" in the last 168 hours. BNP (last 3 results) No results for input(s): "PROBNP" in the last  8760 hours. HbA1C: No results for input(s): "HGBA1C" in the last 72 hours. CBG: No results for input(s): "GLUCAP" in the last 168 hours. Lipid Profile: No results for input(s): "CHOL", "HDL", "LDLCALC", "TRIG", "CHOLHDL", "LDLDIRECT" in the last 72 hours. Thyroid Function Tests: No results for input(s): "TSH", "T4TOTAL", "FREET4", "T3FREE", "THYROIDAB" in the last 72 hours. Anemia Panel: No results for input(s): "VITAMINB12", "FOLATE", "FERRITIN", "TIBC", "IRON", "RETICCTPCT" in the last 72 hours. Sepsis Labs: No results for input(s): "PROCALCITON", "LATICACIDVEN" in the last 168 hours.  Recent Results (from the past 240 hours)  Resp panel by RT-PCR (RSV, Flu A&B, Covid) Anterior Nasal Swab     Status: None   Collection Time: 10/12/23  2:53 PM   Specimen: Anterior Nasal  Swab  Result Value Ref Range Status   SARS Coronavirus 2 by RT PCR NEGATIVE NEGATIVE Final    Comment: (NOTE) SARS-CoV-2 target nucleic acids are NOT DETECTED.  The SARS-CoV-2 RNA is generally detectable in upper respiratory specimens during the acute phase of infection. The lowest concentration of SARS-CoV-2 viral copies this assay can detect is 138 copies/mL. A negative result does not preclude SARS-Cov-2 infection and should not be used as the sole basis for treatment or other patient management decisions. A negative result may occur with  improper specimen collection/handling, submission of specimen other than nasopharyngeal swab, presence of viral mutation(s) within the areas targeted by this assay, and inadequate number of viral copies(<138 copies/mL). A negative result must be combined with clinical observations, patient history, and epidemiological information. The expected result is Negative.  Fact Sheet for Patients:  BloggerCourse.com  Fact Sheet for Healthcare Providers:  SeriousBroker.it  This test is no t yet approved or cleared by the [place] FDA and  has been authorized for detection and/or diagnosis of SARS-CoV-2 by FDA under an Emergency Use Authorization (EUA). This EUA will remain  in effect (meaning this test can be used) for the duration of the COVID-19 declaration under Section 564(b)(1) of the Act, 21 U.S.C.section 360bbb-3(b)(1), unless the authorization is terminated  or revoked sooner.       Influenza A by PCR NEGATIVE NEGATIVE Final   Influenza B by PCR NEGATIVE NEGATIVE Final    Comment: (NOTE) The Xpert Xpress SARS-CoV-2/FLU/RSV plus assay is intended as an aid in the diagnosis of influenza from Nasopharyngeal swab specimens and should not be used as a sole basis for treatment. Nasal washings and aspirates are unacceptable for Xpert Xpress SARS-CoV-2/FLU/RSV testing.  Fact Sheet for  Patients: BloggerCourse.com  Fact Sheet for Healthcare Providers: SeriousBroker.it  This test is not yet approved or cleared by the [place] FDA and has been authorized for detection and/or diagnosis of SARS-CoV-2 by FDA under an Emergency Use Authorization (EUA). This EUA will remain in effect (meaning this test can be used) for the duration of the COVID-19 declaration under Section 564(b)(1) of the Act, 21 U.S.C. section 360bbb-3(b)(1), unless the authorization is terminated or revoked.     Resp Syncytial Virus by PCR NEGATIVE NEGATIVE Final    Comment: (NOTE) Fact Sheet for Patients: BloggerCourse.com  Fact Sheet for Healthcare Providers: SeriousBroker.it  This test is not yet approved or cleared by the [place] FDA and has been authorized for detection and/or diagnosis of SARS-CoV-2 by FDA under an Emergency Use Authorization (EUA). This EUA will remain in effect (meaning this test can be used) for the duration of the COVID-19 declaration under Section 564(b)(1) of the Act, 21 U.S.C. section 360bbb-3(b)(1), unless the authorization is terminated  or revoked.  Performed at [facility], [address] [place], [place] [address]   Respiratory (~20 pathogens) panel by PCR     Status: None   Collection Time: 10/12/23  2:53 PM   Specimen: Nasopharyngeal Swab; Respiratory  Result Value Ref Range Status   Adenovirus NOT DETECTED NOT DETECTED Final   Coronavirus 229E NOT DETECTED NOT DETECTED Final    Comment: (NOTE) The Coronavirus on the Respiratory Panel, DOES NOT test for the novel  Coronavirus (2019 nCoV)    Coronavirus HKU1 NOT DETECTED NOT DETECTED Final   Coronavirus NL63 NOT DETECTED NOT DETECTED Final   Coronavirus OC43 NOT DETECTED NOT DETECTED Final   Metapneumovirus NOT DETECTED NOT DETECTED Final   Rhinovirus / Enterovirus NOT  DETECTED NOT DETECTED Final   Influenza A NOT DETECTED NOT DETECTED Final   Influenza B NOT DETECTED NOT DETECTED Final   Parainfluenza Virus 1 NOT DETECTED NOT DETECTED Final   Parainfluenza Virus 2 NOT DETECTED NOT DETECTED Final   Parainfluenza Virus 3 NOT DETECTED NOT DETECTED Final   Parainfluenza Virus 4 NOT DETECTED NOT DETECTED Final   Respiratory Syncytial Virus NOT DETECTED NOT DETECTED Final   Bordetella pertussis NOT DETECTED NOT DETECTED Final   Bordetella Parapertussis NOT DETECTED NOT DETECTED Final   Chlamydophila pneumoniae NOT DETECTED NOT DETECTED Final   Mycoplasma pneumoniae NOT DETECTED NOT DETECTED Final    Comment: Performed at [facility], [address], [place], [place] [address]  MRSA Next Gen by PCR, Nasal     Status: Abnormal   Collection Time: 10/15/23 12:06 AM   Specimen: Nasal Mucosa; Nasal Swab  Result Value Ref Range Status   MRSA by PCR Next Gen DETECTED (A) NOT DETECTED Final    Comment: (NOTE) The GeneXpert MRSA Assay (FDA approved for NASAL specimens only), is one component of a comprehensive MRSA colonization surveillance program. It is not intended to diagnose MRSA infection nor to guide or monitor treatment for MRSA infections. Test performance is not FDA approved in patients less than 60 years old. Performed at [facility], [address] [place], [place] [address]          Radiology Studies: ECHOCARDIOGRAM COMPLETE Result Date: 10/14/2023    ECHOCARDIOGRAM REPORT   Patient Name:   Kathy Skinner Date of Exam: 10/14/2023 Medical Rec #:  [id number]     Height:       61.0 in Accession #:    [id number]    Weight:       147.5 lb Date of Birth:  Jan 20, 1946     BSA:          1.659 m Patient Age:    77 years      BP:           113/64 mmHg Patient Gender: F             HR:           80 bpm. Exam Location:  Inpatient Procedure: Cardiac Doppler, Color Doppler and 2D Echo (Both Spectral and Color            Flow Doppler were  utilized during procedure). Indications:    Dyspnea  History:        Patient has prior history of Echocardiogram examinations, most                 recent 11/05/2016. COPD; Risk Factors:Current Smoker.  Sonographer:    Amy Chionchio Referring Phys: [id number]  Karizma Cheek J [place]  Sonographer Comments: Suboptimal apical window, suboptimal parasternal window and Technically difficult study due to poor echo windows. Image acquisition challenging due to COPD. IMPRESSIONS  1. Very technically difficult study - based on subcostal images, the LVEF appears hyperdynamic - no gross focal wall motion abnormalities.  2. Left ventricular ejection fraction, by estimation, is 65 to 70%. The left ventricle has normal function. The left ventricle has no regional wall motion abnormalities. Left ventricular diastolic parameters are consistent with Grade I diastolic dysfunction (impaired relaxation).  3. Right ventricular systolic function is normal. The right ventricular size is normal.  4. Left atrial size was mildly dilated.  5. The mitral valve was not well visualized. No evidence of mitral valve regurgitation.  6. The aortic valve was not well visualized. Aortic valve regurgitation is not visualized. FINDINGS  Left Ventricle: Left ventricular ejection fraction, by estimation, is 65 to 70%. The left ventricle has normal function. The left ventricle has no regional wall motion abnormalities. Strain imaging was not performed. The left ventricular internal cavity  size was normal in size. There is no left ventricular hypertrophy. Left ventricular diastolic parameters are consistent with Grade I diastolic dysfunction (impaired relaxation). Indeterminate filling pressures. Right Ventricle: The right ventricular size is normal. No increase in right ventricular wall thickness. Right ventricular systolic function is normal. Left Atrium: Left atrial size was mildly dilated. Right Atrium: Right atrial size was normal in size. Pericardium: There is no  evidence of pericardial effusion. Mitral Valve: The mitral valve was not well visualized. No evidence of mitral valve regurgitation. MV peak gradient, 1.7 mmHg. The mean mitral valve gradient is 1.0 mmHg. Tricuspid Valve: The tricuspid valve is not well visualized. Tricuspid valve regurgitation is not demonstrated. Aortic Valve: The aortic valve was not well visualized. Aortic valve regurgitation is not visualized. Aortic valve mean gradient measures 3.0 mmHg. Aortic valve peak gradient measures 6.1 mmHg. Aortic valve area, by VTI measures 2.42 cm. Pulmonic Valve: The pulmonic valve was not well visualized. Pulmonic valve regurgitation is not visualized. Aorta: The aortic root was not well visualized and the ascending aorta was not well visualized. Venous: The inferior vena cava was not well visualized. IAS/Shunts: No atrial level shunt detected by color flow Doppler. Additional Comments: 3D imaging was not performed.  LEFT VENTRICLE PLAX 2D LVIDd:         4.30 cm   Diastology LVIDs:         3.30 cm   LV e' medial:    4.35 cm/s LV PW:         1.00 cm   LV E/e' medial:  11.3 LV IVS:        0.90 cm   LV e' lateral:   6.85 cm/s LVOT diam:     2.10 cm   LV E/e' lateral: 7.2 LV SV:         52 LV SV Index:   31 LVOT Area:     3.46 cm  RIGHT VENTRICLE         IVC TAPSE (M-mode): 1.6 cm  IVC diam: 1.40 cm AORTIC VALVE                    PULMONIC VALVE AV Area (Vmax):    2.46 cm     PV Vmax:       0.91 m/s AV Area (Vmean):   2.31 cm     PV Peak grad:  3.3 mmHg AV Area (VTI):  2.42 cm AV Vmax:           123.00 cm/s AV Vmean:          89.600 cm/s AV VTI:            0.215 m AV Peak Grad:      6.1 mmHg AV Mean Grad:      3.0 mmHg LVOT Vmax:         87.50 cm/s LVOT Vmean:        59.800 cm/s LVOT VTI:          0.150 m LVOT/AV VTI ratio: 0.70 MITRAL VALVE MV Area (PHT): 2.77 cm    SHUNTS MV Area VTI:   3.20 cm    Systemic VTI:  0.15 m MV Peak grad:  1.7 mmHg    Systemic Diam: 2.10 cm MV Mean grad:  1.0 mmHg MV Vmax:        0.66 m/s MV Vmean:      39.8 cm/s MV Decel Time: 274 msec MV E velocity: 49.20 cm/s MV A velocity: 56.90 cm/s MV E/A ratio:  0.86 Zoila Shutter MD Electronically signed by Zoila Shutter MD Signature Date/Time: 10/14/2023/5:11:21 PM    Final    DG CHEST PORT 1 VIEW Result Date: 10/14/2023 CLINICAL DATA:  Shortness of breath. EXAM: PORTABLE CHEST 1 VIEW COMPARISON:  Radiograph 10/11/2022. FINDINGS: Stable heart size and mediastinal contours. Aortic atherosclerosis. Mild bibasilar atelectasis without confluent airspace disease. No pulmonary edema. No pleural effusion or pneumothorax. Rotated exam with scoliosis. IMPRESSION: Mild bibasilar atelectasis. Electronically Signed   By: Narda Rutherford M.D.   On: 10/14/2023 15:46        Scheduled Meds:  arformoterol  15 mcg Nebulization BID   budesonide (PULMICORT) nebulizer solution  0.5 mg Nebulization BID   busPIRone  5 mg Oral TID   donepezil  5 mg Oral Daily   enoxaparin (LOVENOX) injection  40 mg Subcutaneous Q24H   escitalopram  10 mg Oral Daily   folic acid  1 mg Oral Daily   furosemide  40 mg Intravenous Q12H   ipratropium-albuterol  3 mL Nebulization TID   memantine  10 mg Oral BID   methylPREDNISolone (SOLU-MEDROL) injection  40 mg Intravenous BID   montelukast  10 mg Oral q morning   pravastatin  10 mg Oral Daily   traZODone  100 mg Oral QHS   Continuous Infusions:   LOS: 0 days    Time spent: 54 minutes spent on chart review, discussion with nursing staff, consultants, updating family and interview/physical exam; more than 50% of that time was spent in counseling and/or coordination of care.    Alvira Philips [place], DO [facility] Available via Epic secure chat 7am-7pm After these hours, please refer to coverage provider listed on [email or web address] 10/15/2023, 11:07 AM

## 2023-10-16 DIAGNOSIS — J9621 Acute and chronic respiratory failure with hypoxia: Secondary | ICD-10-CM | POA: Diagnosis not present

## 2023-10-16 LAB — BASIC METABOLIC PANEL
Anion gap: 14 (ref 5–15)
BUN: 23 mg/dL (ref 8–23)
CO2: 27 mmol/L (ref 22–32)
Calcium: 7.8 mg/dL — ABNORMAL LOW (ref 8.9–10.3)
Chloride: 96 mmol/L — ABNORMAL LOW (ref 98–111)
Creatinine, Ser: 0.62 mg/dL (ref 0.44–1.00)
GFR, Estimated: >60 mL/min (ref >60)
Glucose, Bld: 127 mg/dL — ABNORMAL HIGH (ref 70–99)
Potassium: 4 mmol/L (ref 3.5–5.1)
Sodium: 137 mmol/L (ref 135–145)

## 2023-10-16 LAB — CBC
HCT: 46.2 % — ABNORMAL HIGH (ref 36.0–46.0)
Hemoglobin: 15 g/dL (ref 12.0–15.0)
MCH: 30.7 pg (ref 26.0–34.0)
MCHC: 32.5 g/dL (ref 30.0–36.0)
MCV: 94.5 fL (ref 80.0–100.0)
Platelets: 272 10*3/uL (ref 150–400)
RBC: 4.89 MIL/uL (ref 3.87–5.11)
RDW: 12.7 % (ref 11.5–15.5)
WBC: 10.7 10*3/uL — ABNORMAL HIGH (ref 4.0–10.5)
nRBC: 0 % (ref 0.0–0.2)

## 2023-10-16 LAB — BRAIN NATRIURETIC PEPTIDE: B Natriuretic Peptide: 100.4 pg/mL — ABNORMAL HIGH (ref 0.0–100.0)

## 2023-10-16 LAB — MAGNESIUM: Magnesium: 2.6 mg/dL — ABNORMAL HIGH (ref 1.7–2.4)

## 2023-10-16 MED ORDER — MUPIROCIN 2 % EX OINT
1.0000 | TOPICAL_OINTMENT | Freq: Two times a day (BID) | CUTANEOUS | Status: AC
  Administered 2023-10-16 – 2023-10-20 (×10): 1 via NASAL
  Filled 2023-10-16: qty 22

## 2023-10-16 MED ORDER — CHLORHEXIDINE GLUCONATE CLOTH 2 % EX PADS
6.0000 | MEDICATED_PAD | Freq: Every day | CUTANEOUS | Status: AC
  Administered 2023-10-16 – 2023-10-20 (×5): 6 via TOPICAL

## 2023-10-16 NOTE — TOC Progression Note (Signed)
Transition of Care [facility]) - Progression Note    Patient Details  Name: Kathy Skinner MRN: [id number] Date of Birth: November 16, 1945  [facility] CM/SW Contact  Amada Jupiter, LCSW Phone Number: 10/16/2023, 2:58 PM  Clinical Narrative:    Have spoken with Grover Canavan - Resident Care Director at [facility] today to review baseline status for pt.  Of note, this is not a SNF but a memory care unit.  Per facility, her baseline is independent with mobility (without device) and was not requiring O2. Pt did have assist with bathing and some hygiene. Facility has said she will need to be close to baseline for return meaning independent or mod independent with mobility. Have alerted MD and PT.  Reviewed above with pt's daughter as well.  She is hopeful pt can return to memory care but aware not yet medically ready for this.     Expected Discharge Plan: Memory Care Barriers to Discharge: Continued Medical Work up  Expected Discharge Plan and Services   Discharge Planning Services: CM Consult Post Acute Care Choice:  (memory care/HHPT)                                         Social Determinants of Health (SDOH) Interventions SDOH Screenings   Food Insecurity: No Food Insecurity (10/13/2023)  Housing: Low Risk  (10/13/2023)  Transportation Needs: No Transportation Needs (10/13/2023)  Utilities: Not At Risk (10/13/2023)  Social Connections: Patient Unable To Answer (10/13/2023)  Tobacco Use: Medium Risk (10/12/2023)    Readmission Risk Interventions     No data to display

## 2023-10-16 NOTE — Progress Notes (Signed)
Physical Therapy Treatment Patient Details Name: Kathy Skinner MRN: [id number] DOB: [birth year] Today's Date: 10/16/2023   History of Present Illness 78 yo female admitted with acute on chronic respiratory failure. PMH: anxiety, panic attacks, osteoporosis,COPD, chronic respiratory failure, dementia, vertigo    PT Comments  Pt admitted with above diagnosis.  Pt currently with functional limitations due to the deficits listed below (see PT Problem List). PT arrived and pt positioned in bed at an angle with B feet off EOB. Pt on 8 L/min and 90% pre and post mobility tasks. Pt required encouragement for participation with therapy and to get OOB. Pt declined gait tasks today. Pt required A for ADLs with gown soiled. Pt required mod A for supine to sit, min A with HHA for SPT bed to recliner. Pt left in recliner, all needs in place and on 8 L/min. Pt set up for lunch however declined anything but tea. Pt will benefit from acute skilled PT to increase their independence and safety with mobility to allow discharge.      If plan is discharge home, recommend the following: A lot of help with walking and/or transfers;Direct supervision/assist for medications management;Assist for transportation;Direct supervision/assist for financial management;A lot of help with bathing/dressing/bathroom;Assistance with cooking/housework;Supervision due to cognitive status   Can travel by private vehicle        Equipment Recommendations  None recommended by PT    Recommendations for Other Services       Precautions / Restrictions Precautions Precautions: Fall Precaution/Restrictions Comments: monitor O2 Restrictions Weight Bearing Restrictions Per Provider Order: No     Mobility  Bed Mobility Overal bed mobility: Needs Assistance Bed Mobility: Supine to Sit     Supine to sit: Mod assist, HOB elevated     General bed mobility comments: Increased time cues for encouragement    Transfers Overall  transfer level: Needs assistance Equipment used: 1 person hand held assist Transfers: Sit to/from Stand, Bed to chair/wheelchair/BSC Sit to Stand: Min assist   Step pivot transfers: Min assist       General transfer comment: no reports of dizziness, cues and increased time with mild instability and pt demonstrating unsafe technique    Ambulation/Gait               General Gait Details: pt declined today   Stairs             Wheelchair Mobility     Tilt Bed    Modified Rankin (Stroke Patients Only)       Balance Overall balance assessment: Needs assistance Sitting-balance support: Feet supported, Single extremity supported Sitting balance-Leahy Scale: Fair     Standing balance support: Single extremity supported, During functional activity Standing balance-Leahy Scale: Poor                              Communication Communication Communication: No apparent difficulties  Cognition Arousal: Alert Behavior During Therapy: WFL for tasks assessed/performed   PT - Cognitive impairments: Memory, History of cognitive impairments                         Following commands: Impaired Following commands impaired: Follows one step commands with increased time    Cueing Cueing Techniques: Verbal cues  Exercises      General Comments General comments (skin integrity, edema, etc.): pt on 8 L/min during threapy session and 90% pre and post activity  Pertinent Vitals/Pain Pain Assessment Pain Assessment: No/denies pain (no indication of pain response with mobility tasks)    Home Living                          Prior Function            PT Goals (current goals can now be found in the care plan section) Acute Rehab PT Goals Patient Stated Goal: none stated PT Goal Formulation: Patient unable to participate in goal setting Time For Goal Achievement: 10/28/23 Potential to Achieve Goals: Fair Progress towards PT  goals: Progressing toward goals (slowly)    Frequency    Min 1X/week      PT Plan      Co-evaluation              AM-PAC PT "6 Clicks" Mobility   Outcome Measure  Help needed turning from your back to your side while in a flat bed without using bedrails?: A Little Help needed moving from lying on your back to sitting on the side of a flat bed without using bedrails?: A Little Help needed moving to and from a bed to a chair (including a wheelchair)?: A Little Help needed standing up from a chair using your arms (e.g., wheelchair or bedside chair)?: A Little Help needed to walk in hospital room?: Total Help needed climbing 3-5 steps with a railing? : Total 6 Click Score: 14    End of Session Equipment Utilized During Treatment: Oxygen Activity Tolerance: Patient limited by fatigue Patient left: in chair;with call bell/phone within reach;with chair alarm set Nurse Communication: Mobility status PT Visit Diagnosis: Muscle weakness (generalized) (M62.81);Difficulty in walking, not elsewhere classified (R26.2);History of falling (Z91.81)     Time: 4034-7425 PT Time Calculation (min) (ACUTE ONLY): 23 min  Charges:    $Therapeutic Activity: 23-37 mins PT General Charges $$ ACUTE PT VISIT: 1 Visit                     Kathy Skinner, PT Acute Rehab    Kathy Skinner 10/16/2023, 1:32 PM

## 2023-10-16 NOTE — Progress Notes (Signed)
  Progress Note   Patient: Kathy Skinner WUJ:[id number] DOB: April 21, 1946 DOA: 10/12/2023     1 DOS: the patient was seen and examined on 10/16/2023   Brief hospital course:  78 year old female patient with past medical history significant for dementia, asthma/COPD, osteoporosis, anxiety/depression presented to [facility] long hospital on 2/10 from [facility] memory care unit with weakness cough and hypoxia.  Patient was found to have severe COPD exacerbation and hypoxic respiratory failure.  Workup showed COVID/influenza/RSV PCR negative.  Respiratory viral panel negative. Assessment and Plan:  Acute hypoxic respiratory failure Acute COPD exacerbation -Continue to require 6-7 L of oxygen at rest -Plan to continue current treatment regimen of ICS, LABA -DuoNebs and as needed albuterol -IV Solu-Medrol every 12 hours -Incentive spirometry and flutter valve  Elevated BNP -Euvolemic, patient does have a history of chronic diastolic CHF -Received IV Lasix x 1 yesterday now appears to be euvolemic.  Monitor off diuresis today  Hypokalemia -Resolved  Anxiety/depression -Mentation at baseline, continue Lexapro and BuSpar  Advanced dementia -Sleep hygiene -Encourage activity during daytime -Continue Namenda and Aricept  Deconditioning -PT OT evaluation     Subjective: Patient has no complaints, continue to have respiratory distress and cough, denies any chest pain, no fever or chills.  No overnight event  Physical Exam: Vitals:   10/16/23 0738 10/16/23 0741 10/16/23 1328 10/16/23 1413  BP:   122/78   Pulse:   80   Resp:   18   Temp:   98.1 F (36.7 C)   TempSrc:   Oral   SpO2: (!) 88% 90% 90% 90%  Weight:      Height:       Eyes: PERRL, lids and conjunctivae normal ENMT: Mucous membranes are moist. Posterior pharynx clear of any exudate or lesions.Normal dentition.  Neck: normal, supple, no masses, no thyromegaly Respiratory: clear to auscultation bilaterally, scattered  wheezing, scattered crackles bilaterally, increasing respiratory effort. No accessory muscle use.  Cardiovascular: Regular rate and rhythm, no murmurs / rubs / gallops. No extremity edema. 2+ pedal pulses. No carotid bruits.  Abdomen: no tenderness, no masses palpated. No hepatosplenomegaly. Bowel sounds positive.  Musculoskeletal: no clubbing / cyanosis. No joint deformity upper and lower extremities. Good ROM, no contractures. Normal muscle tone.  Skin: no rashes, lesions, ulcers. No induration Neurologic: CN 2-12 grossly intact. Sensation intact, DTR normal.  Muscle strength 5/5 on both sides Psychiatric: Normal judgment and insight. Alert and oriented x 3. Normal mood.    Data Reviewed: Today's CBC BMP revealed  Family Communication: None at bedside  Disposition: Status is: Inpatient Remains inpatient appropriate because: Patient came in with severe COPD exacerbation requiring supplemental oxygen and IV steroid treatment, continues to need inpatient care  Planned Discharge Destination: Skilled nursing facility    Time spent: 35 minutes  Author: Emeline General, MD 10/16/2023 2:39 PM  For on call review [email or web address].

## 2023-10-16 NOTE — Progress Notes (Signed)
Occupational Therapy Treatment Patient Details Name: Kathy Skinner MRN: [id number] DOB: 11-15-1945 Today's Date: 10/16/2023   History of present illness 78 yo female admitted with acute on chronic respiratory failure. PMH: anxiety, panic attacks, osteoporosis,COPD, chronic respiratory failure, dementia, vertigo   OT comments  Pt. Seen for skilled OT treatment session.  Pt. Found with nasal canula off, assisted placing it back on for pt.  Set up for self feeding beverages.  Able to manage cups with no spillage.  Min a for set up for food items with encouragement for self feeding of food with use of spoon. Pt. Not completing full chew/swallow and removing each item of food after it was placed in her mouth.  Only wanting cold items.  Grooming task set up wash face without cues.  Cont. With acute OT POC.        If plan is discharge home, recommend the following:  A lot of help with walking and/or transfers;A lot of help with bathing/dressing/bathroom;Assistance with feeding;Assistance with cooking/housework;Direct supervision/assist for medications management;Direct supervision/assist for financial management;Assist for transportation;Supervision due to cognitive status   Equipment Recommendations       Recommendations for Other Services      Precautions / Restrictions Precautions Precautions: Fall Precaution/Restrictions Comments: monitor O2       Mobility Bed Mobility Overal bed mobility: Needs Assistance Bed Mobility: Supine to Sit, Sit to Supine     Supine to sit: Supervision Sit to supine: Supervision        Transfers                         Balance                                           ADL either performed or assessed with clinical judgement   ADL Overall ADL's : Needs assistance/impaired Eating/Feeding: Sitting;Bed level;Set up;Minimal assistance Eating/Feeding Details (indicate cue type and reason): pt. initially at bed leve with hob  elevated, able to manage beverage containers without spillage.  transitioned to eob un supported sitting, no lob and able to manage beverage containers and plate and spoon. only wanting cold beverages.  attempted all items on tray. pt. taking food out of her mouth and not even fullly chewing before making "yuck" comments and removing it from her mouth. Grooming: Therapist, nutritional;Set up;Sitting                                      Extremity/Trunk Assessment              Vision       Perception     Praxis     Communication Communication Communication: No apparent difficulties   Cognition Arousal: Alert Behavior During Therapy: WFL for tasks assessed/performed Cognition: History of cognitive impairments, No family/caregiver present to determine baseline             OT - Cognition Comments: unclear on baselinee of cognition but could not even report name                 Following commands: Impaired Following commands impaired: Only follows one step commands consistently, Follows one step commands with increased time, Follows one step commands inconsistently      Cueing   Cueing Techniques: Verbal  cues  Exercises      Shoulder Instructions       General Comments      Pertinent Vitals/ Pain       Pain Assessment Pain Assessment: No/denies pain  Home Living                                          Prior Functioning/Environment              Frequency  Min 1X/week        Progress Toward Goals  OT Goals(current goals can now be found in the care plan section)  Progress towards OT goals: Progressing toward goals     Plan      Co-evaluation                 AM-PAC OT "6 Clicks" Daily Activity     Outcome Measure   Help from another person eating meals?: A Little Help from another person taking care of personal grooming?: A Little Help from another person toileting, which includes using toliet, bedpan,  or urinal?: A Lot Help from another person bathing (including washing, rinsing, drying)?: A Lot Help from another person to put on and taking off regular upper body clothing?: A Little Help from another person to put on and taking off regular lower body clothing?: A Lot 6 Click Score: 15    End of Session    OT Visit Diagnosis: Unsteadiness on feet (R26.81);Other abnormalities of gait and mobility (R26.89);Repeated falls (R29.6);Muscle weakness (generalized) (M62.81)   Activity Tolerance Patient tolerated treatment well   Patient Left in bed;with call bell/phone within reach;with bed alarm set;Other (comment) (all 4 rails up, as found when entering room)   Nurse Communication Other (comment) (reviewed with CNA pt.s request for cold items to eat/drink. also reviewed pt. had taken nasal canula off and was not wearing it upon arrival. place back on pt. at beginning of session.)        Time: 0902-0920 OT Time Calculation (min): 18 min  Charges: OT General Charges $OT Visit: 1 Visit OT Treatments $Self Care/Home Management : 8-22 mins  Boneta Lucks, COTA/L Acute Rehabilitation [phone]   Alessandra Bevels Lorraine-COTA/L 10/16/2023, 10:08 AM

## 2023-10-16 NOTE — Progress Notes (Signed)
RT went to the Pt's room to give her a breathing tx. Her oxygen was 80% on 5L. RT increased her O2 to 8 and her SATS are 90%

## 2023-10-17 ENCOUNTER — Inpatient Hospital Stay (HOSPITAL_COMMUNITY): Payer: 59

## 2023-10-17 DIAGNOSIS — J9621 Acute and chronic respiratory failure with hypoxia: Secondary | ICD-10-CM | POA: Diagnosis not present

## 2023-10-17 LAB — CBC
HCT: 47.7 % — ABNORMAL HIGH (ref 36.0–46.0)
Hemoglobin: 15.9 g/dL — ABNORMAL HIGH (ref 12.0–15.0)
MCH: 31.3 pg (ref 26.0–34.0)
MCHC: 33.3 g/dL (ref 30.0–36.0)
MCV: 93.9 fL (ref 80.0–100.0)
Platelets: 270 10*3/uL (ref 150–400)
RBC: 5.08 MIL/uL (ref 3.87–5.11)
RDW: 12.7 % (ref 11.5–15.5)
WBC: 11.3 10*3/uL — ABNORMAL HIGH (ref 4.0–10.5)
nRBC: 0 % (ref 0.0–0.2)

## 2023-10-17 LAB — BASIC METABOLIC PANEL
Anion gap: 13 (ref 5–15)
BUN: 26 mg/dL — ABNORMAL HIGH (ref 8–23)
CO2: 27 mmol/L (ref 22–32)
Calcium: 7.8 mg/dL — ABNORMAL LOW (ref 8.9–10.3)
Chloride: 94 mmol/L — ABNORMAL LOW (ref 98–111)
Creatinine, Ser: 0.56 mg/dL (ref 0.44–1.00)
GFR, Estimated: >60 mL/min (ref >60)
Glucose, Bld: 100 mg/dL — ABNORMAL HIGH (ref 70–99)
Potassium: 3.3 mmol/L — ABNORMAL LOW (ref 3.5–5.1)
Sodium: 134 mmol/L — ABNORMAL LOW (ref 135–145)

## 2023-10-17 LAB — PROCALCITONIN: Procalcitonin: <0.1 ng/mL

## 2023-10-17 LAB — BRAIN NATRIURETIC PEPTIDE: B Natriuretic Peptide: 151.5 pg/mL — ABNORMAL HIGH (ref 0.0–100.0)

## 2023-10-17 LAB — D-DIMER, QUANTITATIVE: D-Dimer, Quant: 0.99 ug{FEU}/mL — ABNORMAL HIGH (ref 0.00–0.50)

## 2023-10-17 MED ORDER — SODIUM CHLORIDE (PF) 0.9 % IJ SOLN
INTRAMUSCULAR | Status: AC
  Filled 2023-10-17: qty 50

## 2023-10-17 MED ORDER — SODIUM CHLORIDE 0.9 % IV SOLN
2.0000 g | INTRAVENOUS | Status: AC
  Administered 2023-10-17 – 2023-10-21 (×5): 2 g via INTRAVENOUS
  Filled 2023-10-17 (×5): qty 20

## 2023-10-17 MED ORDER — FUROSEMIDE 10 MG/ML IJ SOLN
40.0000 mg | Freq: Every day | INTRAMUSCULAR | Status: DC
  Administered 2023-10-17 – 2023-10-18 (×2): 40 mg via INTRAVENOUS
  Filled 2023-10-17 (×2): qty 4

## 2023-10-17 MED ORDER — AZITHROMYCIN 250 MG PO TABS
500.0000 mg | ORAL_TABLET | Freq: Every day | ORAL | Status: AC
  Administered 2023-10-17 – 2023-10-21 (×5): 500 mg via ORAL
  Filled 2023-10-17 (×5): qty 2

## 2023-10-17 MED ORDER — IOHEXOL 350 MG/ML SOLN
75.0000 mL | Freq: Once | INTRAVENOUS | Status: AC | PRN
  Administered 2023-10-17: 75 mL via INTRAVENOUS

## 2023-10-17 NOTE — Progress Notes (Signed)
 PROGRESS NOTE    Offie Pickron  UXL:[id number] DOB: [birth year] DOA: 10/12/2023 PCP: Laurell Josephs, MD (Inactive)    Brief Narrative:   Neda Willenbring is a 78 y.o. female with past medical history significant for dementia, asthma/COPD, anxiety/panic attacks, osteoporosis who presents to [facility] ED on 2/10 from [facility] memory care unit with weakness, cough and was noted to desaturate to 80% on room air.  Apparently there has been a flu outbreak in the facility.  Patient denied headache, no chest pain, no abdominal pain, no nausea/vomiting/diarrhea.  EMS was activated and patient was brought to the ED for further evaluation and management.  In the ED, temperature 98.7 F, HR 80, RR 18, BP 129/73, SpO2 98% room air.  WBC 6.3, hemoglobin 15.2, platelet count 237.  Sodium 143, potassium 3.2, chloride 102, CO2 29, glucose 112, BUN 13, creatinine 0.47.  AST 17, ALT 14, total bilirubin 1.2.  BNP 129.1.  COVID/influenza/RSV PCR negative.  Respiratory viral panel negative.  Chest x-ray with no acute cardiopulmonary disease process.  Patient received DuoNeb, Solu-Medrol 125 mg IV x 1.  TRH consulted for admission for further evaluation and management of acute hypoxic respite failure secondary to COPD exacerbation.  Assessment & Plan:   Acute hypoxic respiratory failure COPD exacerbation Patient presenting to ED after being found hypoxic at facility with SpO2 80% on room air and placed on supplemental oxygen.  Also has been having generalized weakness over the last several days with known influenza outbreak at the facility; although patient negative for influenza A/B, negative for COVID/RSV and respiratory viral panel were negative.  Chest x-ray with no acute cardiopulmonary disease process. -- Brovana neb twice daily -- Pulmicort neb twice daily -- Solu-Medrol 40 mg IV every 12 hours -- Singulair 10 mg p.o. daily -- DuoNebs scheduled every 6 hours -- Continue supplemental oxygen,  maintain SpO2 > 88%; currently on 4 L [place] with SpO2 93% at rest -- CT angiogram chest to rule out PE or other etiologies for continued hypoxia  Acute on chronic diastolic congestive heart failure BNP elevated on admission 129.1.  TTE with LVEF 65 to 70%, grade 1 diastolic dysfunction, LA mildly dilated. -- Lasix 40 mg IV daily -- Strict I's and O's and daily weights -- BMP/BNP daily  Concern for intermittent urinary retention RN reports intermittent urinary retention, patient has been urinating independently. -- Continue to monitor urine output closely, bladder scan PRN  Hypokalemia Potassium 3.3 this morning, will replete -- Repeat electrolytes in a.m. to include magnesium  Anxiety/panic attacks -- Lexapro 10 mg p.o. daily -- BuSpar 5 mg p.o. 3 times daily  Hyperlipidemia -- Pravastatin 10 mg p.o. daily  Dementia -- Delirium precautions -- Get up during the day -- Encourage a familiar face to remain present throughout the day -- Keep blinds open and lights on during daylight hours -- Minimize the use of opioids/benzodiazepines -- Namenda 10 mg p.o. twice daily -- Donepezil 5 mg p.o. daily  Weakness/debility/deconditioning: -- PT/OT evaluation: Recommend home health on return to memory care unit  Ethics: Discussed with patient's daughter, Susie via telephone on 10/14/2023 regarding CODE STATUS.  Patient's wishes are to be DNR without CPR, intubation or significant resuscitation as this would be "inhumane" given her advanced age, dementia and osteoporotic status.  CODE STATUS changed to DNR.   DVT prophylaxis: enoxaparin (LOVENOX) injection 40 mg Start: 10/12/23 2200    Code Status: Limited: Do not attempt resuscitation (DNR) -DNR-LIMITED -Do Not Intubate/DNI  Family Communication:  No family present at bedside this morning, updated patient's daughter Lynnell Dike via telephone this morning  Disposition Plan:  Level of care: Med-Surg Status is: Inpatient Remains inpatient  appropriate because: Needs further weaning from supplemental oxygen      Consultants:  None  Procedures:  TTE: Pending  Antimicrobials:  None   Subjective: Patient seen examined bedside, resting calmly.  Lying in bed.  Pleasantly confused.  No family present at bedside.  Patient has pulled off her oxygen, oxygen check was satting 77% and replaced on supplemental oxygen.  Denies headache, no chest pain, no abdominal pain, no fever, no nausea/vomiting/diarrhea.  Given that she continues to require supplemental oxygen which is not on baseline, and elevated D-dimer we will check CT angiogram chest to further evaluate causes of hypoxemia.  No further acute concerns per nursing staff.   Objective: Vitals:   10/17/23 0500 10/17/23 0756 10/17/23 1252 10/17/23 1409  BP:   125/70   Pulse:   80   Resp:   20   Temp:   99.3 F (37.4 C)   TempSrc:   Oral   SpO2:  95% (!) 87% 93%  Weight: 57.3 kg     Height:        Intake/Output Summary (Last 24 hours) at 10/17/2023 1444 Last data filed at 10/17/2023 1249 Gross per 24 hour  Intake 440 ml  Output 0 ml  Net 440 ml   Filed Weights   10/12/23 1432 10/17/23 0500  Weight: 66.9 kg 57.3 kg    Examination:  Physical Exam: GEN: NAD, alert, oriented to self but not to person, place, time or situation, elderly in appearance HEENT: NCAT, PERRL, EOMI, sclera clear, MMM PULM: Breath sounds slight diminished bilateral bases with wheezing, no crackles, normal respiratory effort without accessory muscle use, on 4 L nasal cannula with SpO2's 93% at rest CV: RRR w/o M/G/R GI: abd soft, NTND, NABS, no R/G/M MSK: no peripheral edema, moves lower extremities independently NEURO: No focal neurological deficits PSYCH: normal mood/affect Integumentary: No rashes/lesions/wounds noted on exposed skin surfaces    Data Reviewed: I have personally reviewed following labs and imaging studies  CBC: Recent Labs  Lab 10/12/23 1542 10/13/23 0720  10/15/23 0501 10/16/23 0503 10/17/23 0524  WBC 6.3 4.7 8.6 10.7* 11.3*  NEUTROABS 4.9  --   --   --   --   HGB 15.2* 14.4 14.5 15.0 15.9*  HCT 46.2* 44.8 45.4 46.2* 47.7*  MCV 94.7 94.7 96.6 94.5 93.9  PLT 237 259 264 272 270   Basic Metabolic Panel: Recent Labs  Lab 10/12/23 1542 10/13/23 0602 10/14/23 0536 10/15/23 0501 10/16/23 0503 10/17/23 0902  NA 143 141 134* 139 137 134*  K 3.2* 2.8* 4.1 4.3 4.0 3.3*  CL 102 104 101 101 96* 94*  CO2 29 27 24 28 27 27   GLUCOSE 112* 124* 95 134* 127* 100*  BUN 13 16 21 16 23  26*  CREATININE 0.47 0.61 0.50 0.35* 0.62 0.56  CALCIUM 7.9* 7.0* 7.7* 8.0* 7.8* 7.8*  MG 2.9*  --  2.8* 2.7* 2.6*  --    GFR: Estimated Creatinine Clearance: 45.8 mL/min (by C-G formula based on SCr of 0.56 mg/dL). Liver Function Tests: Recent Labs  Lab 10/12/23 1542 10/13/23 0602  AST 17 15  ALT 14 12  ALKPHOS 78 65  BILITOT 1.2 1.0  PROT 7.8 6.7  ALBUMIN 3.0* 2.5*   No results for input(s): "LIPASE", "AMYLASE" in the last 168 hours. No results for input(s): "  AMMONIA" in the last 168 hours. Coagulation Profile: No results for input(s): "INR", "PROTIME" in the last 168 hours. Cardiac Enzymes: No results for input(s): "CKTOTAL", "CKMB", "CKMBINDEX", "TROPONINI" in the last 168 hours. BNP (last 3 results) No results for input(s): "PROBNP" in the last 8760 hours. HbA1C: No results for input(s): "HGBA1C" in the last 72 hours. CBG: No results for input(s): "GLUCAP" in the last 168 hours. Lipid Profile: No results for input(s): "CHOL", "HDL", "LDLCALC", "TRIG", "CHOLHDL", "LDLDIRECT" in the last 72 hours. Thyroid Function Tests: No results for input(s): "TSH", "T4TOTAL", "FREET4", "T3FREE", "THYROIDAB" in the last 72 hours. Anemia Panel: No results for input(s): "VITAMINB12", "FOLATE", "FERRITIN", "TIBC", "IRON", "RETICCTPCT" in the last 72 hours. Sepsis Labs: Recent Labs  Lab 10/17/23 0902  PROCALCITON <0.10    Recent Results (from the past  240 hours)  Resp panel by RT-PCR (RSV, Flu A&B, Covid) Anterior Nasal Swab     Status: None   Collection Time: 10/12/23  2:53 PM   Specimen: Anterior Nasal Swab  Result Value Ref Range Status   SARS Coronavirus 2 by RT PCR NEGATIVE NEGATIVE Final    Comment: (NOTE) SARS-CoV-2 target nucleic acids are NOT DETECTED.  The SARS-CoV-2 RNA is generally detectable in upper respiratory specimens during the acute phase of infection. The lowest concentration of SARS-CoV-2 viral copies this assay can detect is 138 copies/mL. A negative result does not preclude SARS-Cov-2 infection and should not be used as the sole basis for treatment or other patient management decisions. A negative result may occur with  improper specimen collection/handling, submission of specimen other than nasopharyngeal swab, presence of viral mutation(s) within the areas targeted by this assay, and inadequate number of viral copies(<138 copies/mL). A negative result must be combined with clinical observations, patient history, and epidemiological information. The expected result is Negative.  Fact Sheet for Patients:  BloggerCourse.com  Fact Sheet for Healthcare Providers:  SeriousBroker.it  This test is no t yet approved or cleared by the [place] FDA and  has been authorized for detection and/or diagnosis of SARS-CoV-2 by FDA under an Emergency Use Authorization (EUA). This EUA will remain  in effect (meaning this test can be used) for the duration of the COVID-19 declaration under Section 564(b)(1) of the Act, 21 U.S.C.section 360bbb-3(b)(1), unless the authorization is terminated  or revoked sooner.       Influenza A by PCR NEGATIVE NEGATIVE Final   Influenza B by PCR NEGATIVE NEGATIVE Final    Comment: (NOTE) The Xpert Xpress SARS-CoV-2/FLU/RSV plus assay is intended as an aid in the diagnosis of influenza from Nasopharyngeal swab specimens and should  not be used as a sole basis for treatment. Nasal washings and aspirates are unacceptable for Xpert Xpress SARS-CoV-2/FLU/RSV testing.  Fact Sheet for Patients: BloggerCourse.com  Fact Sheet for Healthcare Providers: SeriousBroker.it  This test is not yet approved or cleared by the [place] FDA and has been authorized for detection and/or diagnosis of SARS-CoV-2 by FDA under an Emergency Use Authorization (EUA). This EUA will remain in effect (meaning this test can be used) for the duration of the COVID-19 declaration under Section 564(b)(1) of the Act, 21 U.S.C. section 360bbb-3(b)(1), unless the authorization is terminated or revoked.     Resp Syncytial Virus by PCR NEGATIVE NEGATIVE Final    Comment: (NOTE) Fact Sheet for Patients: BloggerCourse.com  Fact Sheet for Healthcare Providers: SeriousBroker.it  This test is not yet approved or cleared by the [place] FDA and has been authorized for detection  and/or diagnosis of SARS-CoV-2 by FDA under an Emergency Use Authorization (EUA). This EUA will remain in effect (meaning this test can be used) for the duration of the COVID-19 declaration under Section 564(b)(1) of the Act, 21 U.S.C. section 360bbb-3(b)(1), unless the authorization is terminated or revoked.  Performed at [facility], [address], [place], [place] [address]   Respiratory (~20 pathogens) panel by PCR     Status: None   Collection Time: 10/12/23  2:53 PM   Specimen: Nasopharyngeal Swab; Respiratory  Result Value Ref Range Status   Adenovirus NOT DETECTED NOT DETECTED Final   Coronavirus 229E NOT DETECTED NOT DETECTED Final    Comment: (NOTE) The Coronavirus on the Respiratory Panel, DOES NOT test for the novel  Coronavirus (2019 nCoV)    Coronavirus HKU1 NOT DETECTED NOT DETECTED Final   Coronavirus NL63 NOT DETECTED NOT  DETECTED Final   Coronavirus OC43 NOT DETECTED NOT DETECTED Final   Metapneumovirus NOT DETECTED NOT DETECTED Final   Rhinovirus / Enterovirus NOT DETECTED NOT DETECTED Final   Influenza A NOT DETECTED NOT DETECTED Final   Influenza B NOT DETECTED NOT DETECTED Final   Parainfluenza Virus 1 NOT DETECTED NOT DETECTED Final   Parainfluenza Virus 2 NOT DETECTED NOT DETECTED Final   Parainfluenza Virus 3 NOT DETECTED NOT DETECTED Final   Parainfluenza Virus 4 NOT DETECTED NOT DETECTED Final   Respiratory Syncytial Virus NOT DETECTED NOT DETECTED Final   Bordetella pertussis NOT DETECTED NOT DETECTED Final   Bordetella Parapertussis NOT DETECTED NOT DETECTED Final   Chlamydophila pneumoniae NOT DETECTED NOT DETECTED Final   Mycoplasma pneumoniae NOT DETECTED NOT DETECTED Final    Comment: Performed at [facility], [address], [place], [place] [address]  MRSA Next Gen by PCR, Nasal     Status: Abnormal   Collection Time: 10/15/23 12:06 AM   Specimen: Nasal Mucosa; Nasal Swab  Result Value Ref Range Status   MRSA by PCR Next Gen DETECTED (A) NOT DETECTED Final    Comment: (NOTE) The GeneXpert MRSA Assay (FDA approved for NASAL specimens only), is one component of a comprehensive MRSA colonization surveillance program. It is not intended to diagnose MRSA infection nor to guide or monitor treatment for MRSA infections. Test performance is not FDA approved in patients less than 41 years old. Performed at [facility], [address], [place], [place] [address]          Radiology Studies: No results found.       Scheduled Meds:  arformoterol  15 mcg Nebulization BID   budesonide (PULMICORT) nebulizer solution  0.5 mg Nebulization BID   busPIRone  5 mg Oral TID   Chlorhexidine Gluconate Cloth  6 each Topical Daily   donepezil  5 mg Oral Daily   enoxaparin (LOVENOX) injection  40 mg Subcutaneous Q24H   escitalopram  10 mg Oral Daily   folic acid  1  mg Oral Daily   furosemide  40 mg Intravenous Daily   ipratropium-albuterol  3 mL Nebulization TID   memantine  10 mg Oral BID   methylPREDNISolone (SOLU-MEDROL) injection  40 mg Intravenous BID   montelukast  10 mg Oral q morning   mupirocin ointment  1 Application Nasal BID   pravastatin  10 mg Oral Daily   traZODone  100 mg Oral QHS   Continuous Infusions:   LOS: 2 days    Time spent: 54 minutes spent on chart review, discussion with nursing  staff, consultants, updating family and interview/physical exam; more than 50% of that time was spent in counseling and/or coordination of care.    Alvira Philips [place], DO [facility] Available via Epic secure chat 7am-7pm After these hours, please refer to coverage provider listed on [email or web address] 10/17/2023, 2:44 PM

## 2023-10-17 NOTE — Plan of Care (Signed)
  Problem: Clinical Measurements: Goal: Respiratory complications will improve Outcome: Progressing   Problem: Activity: Goal: Risk for activity intolerance will decrease Outcome: Not Progressing   Problem: Nutrition: Goal: Adequate nutrition will be maintained Outcome: Not Progressing   

## 2023-10-18 DIAGNOSIS — J9621 Acute and chronic respiratory failure with hypoxia: Secondary | ICD-10-CM | POA: Diagnosis not present

## 2023-10-18 LAB — CBC
HCT: 49.4 % — ABNORMAL HIGH (ref 36.0–46.0)
Hemoglobin: 16 g/dL — ABNORMAL HIGH (ref 12.0–15.0)
MCH: 30.8 pg (ref 26.0–34.0)
MCHC: 32.4 g/dL (ref 30.0–36.0)
MCV: 95 fL (ref 80.0–100.0)
Platelets: 253 10*3/uL (ref 150–400)
RBC: 5.2 MIL/uL — ABNORMAL HIGH (ref 3.87–5.11)
RDW: 12.7 % (ref 11.5–15.5)
WBC: 8.6 10*3/uL (ref 4.0–10.5)
nRBC: 0 % (ref 0.0–0.2)

## 2023-10-18 LAB — MAGNESIUM: Magnesium: 2.7 mg/dL — ABNORMAL HIGH (ref 1.7–2.4)

## 2023-10-18 LAB — BASIC METABOLIC PANEL
Anion gap: 12 (ref 5–15)
BUN: 23 mg/dL (ref 8–23)
CO2: 25 mmol/L (ref 22–32)
Calcium: 7.6 mg/dL — ABNORMAL LOW (ref 8.9–10.3)
Chloride: 95 mmol/L — ABNORMAL LOW (ref 98–111)
Creatinine, Ser: 0.48 mg/dL (ref 0.44–1.00)
GFR, Estimated: >60 mL/min (ref >60)
Glucose, Bld: 121 mg/dL — ABNORMAL HIGH (ref 70–99)
Potassium: 3.7 mmol/L (ref 3.5–5.1)
Sodium: 132 mmol/L — ABNORMAL LOW (ref 135–145)

## 2023-10-18 LAB — BRAIN NATRIURETIC PEPTIDE: B Natriuretic Peptide: 119.2 pg/mL — ABNORMAL HIGH (ref 0.0–100.0)

## 2023-10-18 MED ORDER — BOOST PLUS PO LIQD
237.0000 mL | Freq: Three times a day (TID) | ORAL | Status: DC
  Administered 2023-10-18 – 2023-10-29 (×26): 237 mL via ORAL
  Filled 2023-10-18 (×36): qty 237

## 2023-10-18 MED ORDER — POTASSIUM CHLORIDE CRYS ER 20 MEQ PO TBCR
30.0000 meq | EXTENDED_RELEASE_TABLET | Freq: Once | ORAL | Status: AC
  Administered 2023-10-18: 30 meq via ORAL
  Filled 2023-10-18: qty 1

## 2023-10-18 MED ORDER — BOOST / RESOURCE BREEZE PO LIQD CUSTOM: 1.0000 | Freq: Three times a day (TID) | ORAL | Status: DC

## 2023-10-18 NOTE — Plan of Care (Signed)
  Problem: Health Behavior/Discharge Planning: Goal: Ability to manage health-related needs will improve Outcome: Not Progressing   Problem: Clinical Measurements: Goal: Will remain free from infection Outcome: Not Progressing   Problem: Nutrition: Goal: Adequate nutrition will be maintained Outcome: Not Progressing   

## 2023-10-18 NOTE — Progress Notes (Signed)
 PROGRESS NOTE    Kathy Skinner  YQM:[id number] DOB: 1946/02/29 DOA: 10/12/2023 PCP: Laurell Josephs, MD (Inactive)    Brief Narrative:   Kathy Skinner is a 78 y.o. female with past medical history significant for dementia, asthma/COPD, anxiety/panic attacks, osteoporosis who presents to [facility] ED on 2/10 from [facility] memory care unit with weakness, cough and was noted to desaturate to 80% on room air.  Apparently there has been a flu outbreak in the facility.  Patient denied headache, no chest pain, no abdominal pain, no nausea/vomiting/diarrhea.  EMS was activated and patient was brought to the ED for further evaluation and management.  In the ED, temperature 98.7 F, HR 80, RR 18, BP 129/73, SpO2 98% room air.  WBC 6.3, hemoglobin 15.2, platelet count 237.  Sodium 143, potassium 3.2, chloride 102, CO2 29, glucose 112, BUN 13, creatinine 0.47.  AST 17, ALT 14, total bilirubin 1.2.  BNP 129.1.  COVID/influenza/RSV PCR negative.  Respiratory viral panel negative.  Chest x-ray with no acute cardiopulmonary disease process.  Patient received DuoNeb, Solu-Medrol 125 mg IV x 1.  TRH consulted for admission for further evaluation and management of acute hypoxic respite failure secondary to COPD exacerbation.  Assessment & Plan:   Acute hypoxic respiratory failure COPD exacerbation Patient presenting to ED after being found hypoxic at facility with SpO2 80% on room air and placed on supplemental oxygen.  Also has been having generalized weakness over the last several days with known influenza outbreak at the facility; although patient negative for influenza A/B, negative for COVID/RSV and respiratory viral panel were negative.  Chest x-ray with no acute cardiopulmonary disease process. -- Brovana neb twice daily -- Pulmicort neb twice daily -- Solu-Medrol 40 mg IV every 12 hours -- Singulair 10 mg p.o. daily -- DuoNebs scheduled every 6 hours -- Continue supplemental oxygen,  maintain SpO2 > 88%; currently on 3 L [place] with SpO2 93% at rest  Community-acquired pneumonia CT angiogram chest with no evidence of PE or dissection, noted bilateral lower lobe consolidation with nodular interstitial process consistent with infection versus inflammatory process -- Azithromycin 500 mg PO daily x 5 days -- Ceftriaxone 2g IV q24  -- CBC daily  Acute on chronic diastolic congestive heart failure BNP elevated on admission 129.1.  TTE with LVEF 65 to 70%, grade 1 diastolic dysfunction, LA mildly dilated. -- Lasix 40 mg IV daily -- Strict I's and O's and daily weights -- BMP/BNP daily  Concern for intermittent urinary retention RN reports intermittent urinary retention, patient has been urinating independently. -- Continue to monitor urine output closely, bladder scan PRN  Hypokalemia Potassium 3.7 this morning, will replete -- Repeat electrolytes in a.m. to include magnesium  Anxiety/panic attacks -- Lexapro 10 mg p.o. daily -- BuSpar 5 mg p.o. 3 times daily  Hyperlipidemia -- Pravastatin 10 mg p.o. daily  Dementia -- Delirium precautions -- Get up during the day -- Encourage a familiar face to remain present throughout the day -- Keep blinds open and lights on during daylight hours -- Minimize the use of opioids/benzodiazepines -- Namenda 10 mg p.o. twice daily -- Donepezil 5 mg p.o. daily  Weakness/debility/deconditioning: -- PT/OT evaluation: Recommend home health on return to memory care unit  Adult failure to thrive Patient with poor oral intake, not eating much per RN staff but does drink fluids. -- Nutrition consult for calorie count -- If appetite does not improve may need to consider further goals of care discussions  Ethics: Discussed  with patient's daughter, Kathy Skinner via telephone on 10/14/2023 regarding CODE STATUS.  Patient's wishes are to be DNR without CPR, intubation or significant resuscitation as this would be "inhumane" given her advanced age,  dementia and osteoporotic status.  CODE STATUS changed to DNR.   DVT prophylaxis: enoxaparin (LOVENOX) injection 40 mg Start: 10/12/23 2200    Code Status: Limited: Do not attempt resuscitation (DNR) -DNR-LIMITED -Do Not Intubate/DNI  Family Communication: No family present at bedside this morning, updated patient's daughter Kathy Skinner via telephone this morning  Disposition Plan:  Level of care: Med-Surg Status is: Inpatient Remains inpatient appropriate because: Needs further weaning from supplemental oxygen      Consultants:  None  Procedures:  TTE  Antimicrobials:  None   Subjective: Patient seen examined bedside, resting calmly.  Lying in bed.  Remains pleasantly confused.  No family present at bedside.  RN present at bedside.  O2 requirements slowly improving, down to 3 L nasal cannula this morning.  RN reports patient not eating much, but will drink fluids.  Denies headache, no chest pain, no abdominal pain, no fever, no nausea/vomiting/diarrhea.  No acute concerns per nursing staff.  Updated patient's daughter Kathy Skinner via telephone this morning who will be coming to the bedside later today with patient's Trelegy inhaler and requesting that her mother have boost supplements, chocolate flavored.  Encouraged patient's daughter to be present more often for assistance in the care of her mother, but she reports has been sick recently herself with other family members currently hospitalized.   Objective: Vitals:   10/18/23 0522 10/18/23 0548 10/18/23 0755 10/18/23 0931  BP:  129/78    Pulse:  67    Resp:  20    Temp:  (!) 97.5 F (36.4 C)    TempSrc:  Oral    SpO2:  94% 92% 93%  Weight: 56 kg     Height:        Intake/Output Summary (Last 24 hours) at 10/18/2023 1017 Last data filed at 10/18/2023 0932 Gross per 24 hour  Intake 730 ml  Output --  Net 730 ml   Filed Weights   10/12/23 1432 10/17/23 0500 10/18/23 0522  Weight: 66.9 kg 57.3 kg 56 kg     Examination:  Physical Exam: GEN: NAD, alert, oriented to self but not to person, place, time or situation, elderly in appearance HEENT: NCAT, PERRL, EOMI, sclera clear, MMM PULM: Breath sounds slight diminished bilateral bases without wheezing/crackles, normal respiratory effort without accessory muscle use, on 3 L nasal cannula with SpO2's 93% at rest CV: RRR w/o M/G/R GI: abd soft, NTND, + BS MSK: no peripheral edema, moves lower extremities independently NEURO: No focal neurological deficits PSYCH: normal mood/affect Integumentary: No rashes/lesions/wounds noted on exposed skin surfaces    Data Reviewed: I have personally reviewed following labs and imaging studies  CBC: Recent Labs  Lab 10/12/23 1542 10/13/23 0720 10/15/23 0501 10/16/23 0503 10/17/23 0524 10/18/23 0459  WBC 6.3 4.7 8.6 10.7* 11.3* 8.6  NEUTROABS 4.9  --   --   --   --   --   HGB 15.2* 14.4 14.5 15.0 15.9* 16.0*  HCT 46.2* 44.8 45.4 46.2* 47.7* 49.4*  MCV 94.7 94.7 96.6 94.5 93.9 95.0  PLT 237 259 264 272 270 253   Basic Metabolic Panel: Recent Labs  Lab 10/12/23 1542 10/13/23 0602 10/14/23 0536 10/15/23 0501 10/16/23 0503 10/17/23 0902 10/18/23 0459  NA 143   < > 134* 139 137 134* 132*  K 3.2*   < >  4.1 4.3 4.0 3.3* 3.7  CL 102   < > 101 101 96* 94* 95*  CO2 29   < > 24 28 27 27 25   GLUCOSE 112*   < > 95 134* 127* 100* 121*  BUN 13   < > 21 16 23  26* 23  CREATININE 0.47   < > 0.50 0.35* 0.62 0.56 0.48  CALCIUM 7.9*   < > 7.7* 8.0* 7.8* 7.8* 7.6*  MG 2.9*  --  2.8* 2.7* 2.6*  --  2.7*   < > = values in this interval not displayed.   GFR: Estimated Creatinine Clearance: 45.8 mL/min (by C-G formula based on SCr of 0.48 mg/dL). Liver Function Tests: Recent Labs  Lab 10/12/23 1542 10/13/23 0602  AST 17 15  ALT 14 12  ALKPHOS 78 65  BILITOT 1.2 1.0  PROT 7.8 6.7  ALBUMIN 3.0* 2.5*   No results for input(s): "LIPASE", "AMYLASE" in the last 168 hours. No results for input(s):  "AMMONIA" in the last 168 hours. Coagulation Profile: No results for input(s): "INR", "PROTIME" in the last 168 hours. Cardiac Enzymes: No results for input(s): "CKTOTAL", "CKMB", "CKMBINDEX", "TROPONINI" in the last 168 hours. BNP (last 3 results) No results for input(s): "PROBNP" in the last 8760 hours. HbA1C: No results for input(s): "HGBA1C" in the last 72 hours. CBG: No results for input(s): "GLUCAP" in the last 168 hours. Lipid Profile: No results for input(s): "CHOL", "HDL", "LDLCALC", "TRIG", "CHOLHDL", "LDLDIRECT" in the last 72 hours. Thyroid Function Tests: No results for input(s): "TSH", "T4TOTAL", "FREET4", "T3FREE", "THYROIDAB" in the last 72 hours. Anemia Panel: No results for input(s): "VITAMINB12", "FOLATE", "FERRITIN", "TIBC", "IRON", "RETICCTPCT" in the last 72 hours. Sepsis Labs: Recent Labs  Lab 10/17/23 0902  PROCALCITON <0.10    Recent Results (from the past 240 hours)  Resp panel by RT-PCR (RSV, Flu A&B, Covid) Anterior Nasal Swab     Status: None   Collection Time: 10/12/23  2:53 PM   Specimen: Anterior Nasal Swab  Result Value Ref Range Status   SARS Coronavirus 2 by RT PCR NEGATIVE NEGATIVE Final    Comment: (NOTE) SARS-CoV-2 target nucleic acids are NOT DETECTED.  The SARS-CoV-2 RNA is generally detectable in upper respiratory specimens during the acute phase of infection. The lowest concentration of SARS-CoV-2 viral copies this assay can detect is 138 copies/mL. A negative result does not preclude SARS-Cov-2 infection and should not be used as the sole basis for treatment or other patient management decisions. A negative result may occur with  improper specimen collection/handling, submission of specimen other than nasopharyngeal swab, presence of viral mutation(s) within the areas targeted by this assay, and inadequate number of viral copies(<138 copies/mL). A negative result must be combined with clinical observations, patient history, and  epidemiological information. The expected result is Negative.  Fact Sheet for Patients:  BloggerCourse.com  Fact Sheet for Healthcare Providers:  SeriousBroker.it  This test is no t yet approved or cleared by the [place] FDA and  has been authorized for detection and/or diagnosis of SARS-CoV-2 by FDA under an Emergency Use Authorization (EUA). This EUA will remain  in effect (meaning this test can be used) for the duration of the COVID-19 declaration under Section 564(b)(1) of the Act, 21 U.S.C.section 360bbb-3(b)(1), unless the authorization is terminated  or revoked sooner.       Influenza A by PCR NEGATIVE NEGATIVE Final   Influenza B by PCR NEGATIVE NEGATIVE Final    Comment: (NOTE) The Xpert  Xpress SARS-CoV-2/FLU/RSV plus assay is intended as an aid in the diagnosis of influenza from Nasopharyngeal swab specimens and should not be used as a sole basis for treatment. Nasal washings and aspirates are unacceptable for Xpert Xpress SARS-CoV-2/FLU/RSV testing.  Fact Sheet for Patients: BloggerCourse.com  Fact Sheet for Healthcare Providers: SeriousBroker.it  This test is not yet approved or cleared by the [place] FDA and has been authorized for detection and/or diagnosis of SARS-CoV-2 by FDA under an Emergency Use Authorization (EUA). This EUA will remain in effect (meaning this test can be used) for the duration of the COVID-19 declaration under Section 564(b)(1) of the Act, 21 U.S.C. section 360bbb-3(b)(1), unless the authorization is terminated or revoked.     Resp Syncytial Virus by PCR NEGATIVE NEGATIVE Final    Comment: (NOTE) Fact Sheet for Patients: BloggerCourse.com  Fact Sheet for Healthcare Providers: SeriousBroker.it  This test is not yet approved or cleared by the [place] FDA and has been  authorized for detection and/or diagnosis of SARS-CoV-2 by FDA under an Emergency Use Authorization (EUA). This EUA will remain in effect (meaning this test can be used) for the duration of the COVID-19 declaration under Section 564(b)(1) of the Act, 21 U.S.C. section 360bbb-3(b)(1), unless the authorization is terminated or revoked.  Performed at [facility], [address], [place], [place] [address]   Respiratory (~20 pathogens) panel by PCR     Status: None   Collection Time: 10/12/23  2:53 PM   Specimen: Nasopharyngeal Swab; Respiratory  Result Value Ref Range Status   Adenovirus NOT DETECTED NOT DETECTED Final   Coronavirus 229E NOT DETECTED NOT DETECTED Final    Comment: (NOTE) The Coronavirus on the Respiratory Panel, DOES NOT test for the novel  Coronavirus (2019 nCoV)    Coronavirus HKU1 NOT DETECTED NOT DETECTED Final   Coronavirus NL63 NOT DETECTED NOT DETECTED Final   Coronavirus OC43 NOT DETECTED NOT DETECTED Final   Metapneumovirus NOT DETECTED NOT DETECTED Final   Rhinovirus / Enterovirus NOT DETECTED NOT DETECTED Final   Influenza A NOT DETECTED NOT DETECTED Final   Influenza B NOT DETECTED NOT DETECTED Final   Parainfluenza Virus 1 NOT DETECTED NOT DETECTED Final   Parainfluenza Virus 2 NOT DETECTED NOT DETECTED Final   Parainfluenza Virus 3 NOT DETECTED NOT DETECTED Final   Parainfluenza Virus 4 NOT DETECTED NOT DETECTED Final   Respiratory Syncytial Virus NOT DETECTED NOT DETECTED Final   Bordetella pertussis NOT DETECTED NOT DETECTED Final   Bordetella Parapertussis NOT DETECTED NOT DETECTED Final   Chlamydophila pneumoniae NOT DETECTED NOT DETECTED Final   Mycoplasma pneumoniae NOT DETECTED NOT DETECTED Final    Comment: Performed at [facility], [address], [place], [place] [address]  MRSA Next Gen by PCR, Nasal     Status: Abnormal   Collection Time: 10/15/23 12:06 AM   Specimen: Nasal Mucosa; Nasal Swab  Result Value Ref  Range Status   MRSA by PCR Next Gen DETECTED (A) NOT DETECTED Final    Comment: (NOTE) The GeneXpert MRSA Assay (FDA approved for NASAL specimens only), is one component of a comprehensive MRSA colonization surveillance program. It is not intended to diagnose MRSA infection nor to guide or monitor treatment for MRSA infections. Test performance is not FDA approved in patients less than 27 years old. Performed at [facility], [address], [place], [place] [address]          Radiology Studies: CT Angio Chest  Pulmonary Embolism (PE) W or WO Contrast Result Date: 10/17/2023 CLINICAL DATA:  Weakness, cough, hypoxia. EXAM: CT ANGIOGRAPHY CHEST WITH CONTRAST TECHNIQUE: Multidetector CT imaging of the chest was performed using the standard protocol during bolus administration of intravenous contrast. Multiplanar CT image reconstructions and MIPs were obtained to evaluate the vascular anatomy. RADIATION DOSE REDUCTION: This exam was performed according to the departmental dose-optimization program which includes automated exposure control, adjustment of the mA and/or kV according to patient size and/or use of iterative reconstruction technique. CONTRAST:  75mL OMNIPAQUE IOHEXOL 350 MG/ML SOLN COMPARISON:  02/05/2015. FINDINGS: Cardiovascular: Ascending thoracic aorta aneurysm 4 cm. No dissection. No pulmonary artery filling defects to suggest PE. No cardiomegaly or pericardial effusion. Atheromatous calcifications of the aorta and coronary arteries. Mediastinum/Nodes: No enlarged mediastinal, hilar, or axillary lymph nodes. Thyroid gland, trachea, and esophagus demonstrate no significant findings. Lungs/Pleura: Dense consolidation or volume loss identified in the lung bases bilaterally. Bilateral lower lobe nodular interstitial process consistent with inflammatory process or infection. Follow up recommended to ensure resolution of this finding. Consider repeat CT in 3 months.  Emphysematous scarring identified in the upper lungs. No pneumothorax or pleural effusions. Upper Abdomen: No acute abnormality. Musculoskeletal: Thoracic degenerative disc disease. Osteopenia. Lower thoracic compression deformity is unchanged. Review of the MIP images confirms the above findings. IMPRESSION: 1. No evidence of PE or dissection. 2. 4 cm ascending thoracic aorta aneurysm. 3. Bilateral lower lobe consolidation or volume loss. 4. Bilateral lower lobe nodular interstitial process consistent with inflammatory process or infection. Follow up recommended to ensure resolution of this finding. 5. Aortic and coronary artery atherosclerosis. Aortic Atherosclerosis (ICD10-I70.0) and Emphysema (ICD10-J43.9). Electronically Signed   By: Layla Maw M.D.   On: 10/17/2023 16:12         Scheduled Meds:  arformoterol  15 mcg Nebulization BID   azithromycin  500 mg Oral Daily   budesonide (PULMICORT) nebulizer solution  0.5 mg Nebulization BID   busPIRone  5 mg Oral TID   Chlorhexidine Gluconate Cloth  6 each Topical Daily   donepezil  5 mg Oral Daily   enoxaparin (LOVENOX) injection  40 mg Subcutaneous Q24H   escitalopram  10 mg Oral Daily   folic acid  1 mg Oral Daily   furosemide  40 mg Intravenous Daily   ipratropium-albuterol  3 mL Nebulization TID   memantine  10 mg Oral BID   methylPREDNISolone (SOLU-MEDROL) injection  40 mg Intravenous BID   montelukast  10 mg Oral q morning   mupirocin ointment  1 Application Nasal BID   pravastatin  10 mg Oral Daily   traZODone  100 mg Oral QHS   Continuous Infusions:  cefTRIAXone (ROCEPHIN)  IV 2 g (10/17/23 1728)     LOS: 3 days    Time spent: 54 minutes spent on chart review, discussion with nursing staff, consultants, updating family and interview/physical exam; more than 50% of that time was spent in counseling and/or coordination of care.    Alvira Philips [place], DO [facility] Available via Epic secure chat 7am-7pm After  these hours, please refer to coverage provider listed on [email or web address] 10/18/2023, 10:17 AM

## 2023-10-18 NOTE — Plan of Care (Signed)
  Problem: Pain Managment: Goal: General experience of comfort will improve and/or be controlled 10/18/2023 2305 by Josph Macho, RN Outcome: Progressing 10/18/2023 2305 by Josph Macho, RN Outcome: Progressing   Problem: Safety: Goal: Ability to remain free from injury will improve 10/18/2023 2305 by Josph Macho, RN Outcome: Progressing 10/18/2023 2305 by Josph Macho, RN Outcome: Progressing   Problem: Respiratory: Goal: Ability to maintain a clear airway will improve 10/18/2023 2305 by Josph Macho, RN Outcome: Progressing 10/18/2023 2305 by Alinda Deem D, RN Outcome: Progressing Goal: Levels of oxygenation will improve 10/18/2023 2305 by Josph Macho, RN Outcome: Progressing 10/18/2023 2305 by Josph Macho, RN Outcome: Progressing Goal: Ability to maintain adequate ventilation will improve 10/18/2023 2305 by Josph Macho, RN Outcome: Progressing 10/18/2023 2305 by Josph Macho, RN Outcome: Progressing

## 2023-10-19 DIAGNOSIS — J9621 Acute and chronic respiratory failure with hypoxia: Secondary | ICD-10-CM | POA: Diagnosis not present

## 2023-10-19 DIAGNOSIS — E43 Unspecified severe protein-calorie malnutrition: Secondary | ICD-10-CM | POA: Insufficient documentation

## 2023-10-19 LAB — CBC
HCT: 48.8 % — ABNORMAL HIGH (ref 36.0–46.0)
Hemoglobin: 16.1 g/dL — ABNORMAL HIGH (ref 12.0–15.0)
MCH: 31 pg (ref 26.0–34.0)
MCHC: 33 g/dL (ref 30.0–36.0)
MCV: 94 fL (ref 80.0–100.0)
Platelets: 257 10*3/uL (ref 150–400)
RBC: 5.19 MIL/uL — ABNORMAL HIGH (ref 3.87–5.11)
RDW: 12.6 % (ref 11.5–15.5)
WBC: 8 10*3/uL (ref 4.0–10.5)
nRBC: 0 % (ref 0.0–0.2)

## 2023-10-19 LAB — COMPREHENSIVE METABOLIC PANEL
ALT: 18 U/L (ref 0–44)
AST: 17 U/L (ref 15–41)
Albumin: 2.9 g/dL — ABNORMAL LOW (ref 3.5–5.0)
Alkaline Phosphatase: 69 U/L (ref 38–126)
Anion gap: 12 (ref 5–15)
BUN: 30 mg/dL — ABNORMAL HIGH (ref 8–23)
CO2: 26 mmol/L (ref 22–32)
Calcium: 7.8 mg/dL — ABNORMAL LOW (ref 8.9–10.3)
Chloride: 94 mmol/L — ABNORMAL LOW (ref 98–111)
Creatinine, Ser: 0.48 mg/dL (ref 0.44–1.00)
GFR, Estimated: >60 mL/min (ref >60)
Glucose, Bld: 135 mg/dL — ABNORMAL HIGH (ref 70–99)
Potassium: 3.8 mmol/L (ref 3.5–5.1)
Sodium: 132 mmol/L — ABNORMAL LOW (ref 135–145)
Total Bilirubin: 0.7 mg/dL (ref 0.0–1.2)
Total Protein: 7.1 g/dL (ref 6.5–8.1)

## 2023-10-19 LAB — GLUCOSE, CAPILLARY: Glucose-Capillary: 126 mg/dL — ABNORMAL HIGH (ref 70–99)

## 2023-10-19 LAB — BRAIN NATRIURETIC PEPTIDE: B Natriuretic Peptide: 59.6 pg/mL (ref 0.0–100.0)

## 2023-10-19 MED ORDER — ADULT MULTIVITAMIN W/MINERALS CH
1.0000 | ORAL_TABLET | Freq: Every day | ORAL | Status: DC
  Administered 2023-10-19 – 2023-10-29 (×11): 1 via ORAL
  Filled 2023-10-19 (×11): qty 1

## 2023-10-19 MED ORDER — IPRATROPIUM-ALBUTEROL 0.5-2.5 (3) MG/3ML IN SOLN: 3.0000 mL | Freq: Two times a day (BID) | RESPIRATORY_TRACT | Status: DC

## 2023-10-19 MED ORDER — FLUTICASONE-UMECLIDIN-VILANT 100-62.5-25 MCG/ACT IN AEPB
1.0000 | INHALATION_SPRAY | Freq: Every day | RESPIRATORY_TRACT | Status: DC
  Administered 2023-10-19 – 2023-10-23 (×5): 1 via RESPIRATORY_TRACT
  Administered 2023-10-24: 100 ug via RESPIRATORY_TRACT
  Administered 2023-10-26 – 2023-10-28 (×3): 1 via RESPIRATORY_TRACT
  Administered 2023-10-29: 100 ug via RESPIRATORY_TRACT

## 2023-10-19 NOTE — Progress Notes (Signed)
 PT Cancellation Note  Patient Details Name: Kathy Skinner MRN: [id number] DOB: [birth year]   Cancelled Treatment:    Reason Eval/Treat Not Completed: Fatigue/lethargy limiting ability to participate (pt sleeping soundly, pt stated she's too tired to do PT at present, will check back this afternoon.)  Tamala Ser PT 10/19/2023  Acute Rehabilitation Services  Office [phone]

## 2023-10-19 NOTE — Progress Notes (Signed)
 Initial Nutrition Assessment  DOCUMENTATION CODES:   Severe malnutrition in context of chronic illness  INTERVENTION:   -Boost Plus TID- Each supplement provides 360kcal and 14g protein.     -Multivitamin with minerals daily  -Liberalized diet to regular to promote PO intakes  NUTRITION DIAGNOSIS:   Severe Malnutrition related to chronic illness (dementia, COPD) as evidenced by severe fat depletion, severe muscle depletion.Percent weight loss  GOAL:   Patient will meet greater than or equal to 90% of their needs  MONITOR:   PO intake, Supplement acceptance  REASON FOR ASSESSMENT:   Consult Assessment of nutrition requirement/status  ASSESSMENT:   78 y.o. female with past medical history significant for dementia, asthma/COPD, anxiety/panic attacks, osteoporosis who presents to [facility] ED on 2/10 from [facility] memory care unit with weakness, cough and was noted to desaturate to 80% on room air. Admitted for acute hypoxic respite failure secondary to COPD exacerbation.  Patient in room, kept eyes closed for most of visit. Not very talkative. Pt reports not caring what diet she is on. Reports not having an appetite. Refused all meals yesterday so nothing to calculate for a calorie count. Asked if pt received any Boost, shakes her head  "No". Staff reporting she is refusing them. Placed Boost Plus in the fridge. Asked pt if she would drink a Boost today and she said "yes".  Pt with dementia, from memory care unit. Continue to encourage PO intakes. Liberalized diet to regular for more menu options.    Per weight records, pt has lost  20 lbs since 08/13/23 (13% wt loss x 2 months, significant for time frame).   Medications: Folic acid  Labs reviewed: Low Na   NUTRITION - FOCUSED PHYSICAL EXAM:  Flowsheet Row Most Recent Value  Orbital Region Severe depletion  Upper Arm Region Severe depletion  Thoracic and Lumbar Region Moderate depletion  Buccal  Region Severe depletion  Temple Region Severe depletion  Clavicle Bone Region Severe depletion  Clavicle and Acromion Bone Region Severe depletion  Scapular Bone Region Severe depletion  Dorsal Hand Severe depletion  Patellar Region Moderate depletion  Anterior Thigh Region Moderate depletion  Posterior Calf Region Moderate depletion  Edema (RD Assessment) None  Hair Reviewed  Eyes Reviewed  Mouth Reviewed  Skin Reviewed  Nails Reviewed       Diet Order:   Diet Order             Diet regular Room service appropriate? Yes with Assist; Fluid consistency: Thin  Diet effective now                   EDUCATION NEEDS:   No education needs have been identified at this time  Skin:  Skin Assessment: Reviewed RN Assessment  Last BM:  2/16 -type 6  Height:   Ht Readings from Last 1 Encounters:  10/12/23 5\' 1"  (1.549 m)    Weight:   Wt Readings from Last 1 Encounters:  10/19/23 57.7 kg    BMI:  Body mass index is 24.04 kg/m.  Estimated Nutritional Needs:   Kcal:  1700-1900  Protein:  75-85g  Fluid:  1.9L/day  Tilda Franco, MS, RD, LDN Inpatient Clinical Dietitian Contact via Secure chat

## 2023-10-19 NOTE — Progress Notes (Signed)
 PROGRESS NOTE    Kathy Skinner  ZOX:[id number] DOB: 1946/03/05 DOA: 10/12/2023 PCP: Laurell Josephs, MD (Inactive)    Brief Narrative:   Kathy Skinner is a 78 y.o. female with past medical history significant for dementia, asthma/COPD, anxiety/panic attacks, osteoporosis who presents to [facility] ED on 2/10 from [facility] memory care unit with weakness, cough and was noted to desaturate to 80% on room air.  Apparently there has been a flu outbreak in the facility.  Patient denied headache, no chest pain, no abdominal pain, no nausea/vomiting/diarrhea.  EMS was activated and patient was brought to the ED for further evaluation and management.  In the ED, temperature 98.7 F, HR 80, RR 18, BP 129/73, SpO2 98% room air.  WBC 6.3, hemoglobin 15.2, platelet count 237.  Sodium 143, potassium 3.2, chloride 102, CO2 29, glucose 112, BUN 13, creatinine 0.47.  AST 17, ALT 14, total bilirubin 1.2.  BNP 129.1.  COVID/influenza/RSV PCR negative.  Respiratory viral panel negative.  Chest x-ray with no acute cardiopulmonary disease process.  Patient received DuoNeb, Solu-Medrol 125 mg IV x 1.  TRH consulted for admission for further evaluation and management of acute hypoxic respite failure secondary to COPD exacerbation.  Assessment & Plan:   Acute hypoxic respiratory failure COPD exacerbation Patient presenting to ED after being found hypoxic at facility with SpO2 80% on room air and placed on supplemental oxygen.  Also has been having generalized weakness over the last several days with known influenza outbreak at the facility; although patient negative for influenza A/B, negative for COVID/RSV and respiratory viral panel were negative.  Chest x-ray with no acute cardiopulmonary disease process. -- Brovana neb twice daily -- Pulmicort neb twice daily -- Solu-Medrol 40 mg IV every 12 hours -- Singulair 10 mg p.o. daily -- DuoNebs scheduled every 6 hours -- Continue supplemental oxygen,  maintain SpO2 > 88%; currently on 3 L [place] with SpO2 92% at rest  Community-acquired pneumonia CT angiogram chest with no evidence of PE or dissection, noted bilateral lower lobe consolidation with nodular interstitial process consistent with infection versus inflammatory process -- Azithromycin 500 mg PO daily x 5 days -- Ceftriaxone 2g IV q24  -- CBC daily  Acute on chronic diastolic congestive heart failure BNP elevated on admission 129.1.  TTE with LVEF 65 to 70%, grade 1 diastolic dysfunction, LA mildly dilated. -- Strict I's and O's and daily weights -- Hold further diuresis today -- BMP/BNP daily  Concern for intermittent urinary retention RN reports intermittent urinary retention, patient has been urinating independently. -- Continue to monitor urine output closely, bladder scan PRN  Hypokalemia Potassium 3.7 this morning, will replete -- Repeat electrolytes in a.m. to include magnesium  Anxiety/panic attacks -- Lexapro 10 mg p.o. daily -- BuSpar 5 mg p.o. 3 times daily  Hyperlipidemia -- Pravastatin 10 mg p.o. daily  Dementia -- Delirium precautions -- Get up during the day -- Encourage a familiar face to remain present throughout the day -- Keep blinds open and lights on during daylight hours -- Minimize the use of opioids/benzodiazepines -- Namenda 10 mg p.o. twice daily -- Donepezil 5 mg p.o. daily  Weakness/debility/deconditioning: -- PT/OT evaluation: Recommend home health on return to memory care unit  Adult failure to thrive Patient with poor oral intake, not eating much per RN staff but does drink fluids. -- Nutrition consult for calorie count -- If appetite does not improve may need to consider further goals of care discussions  Ethics: Discussed with  patient's daughter, Kathy Skinner via telephone on 10/14/2023 regarding CODE STATUS.  Patient's wishes are to be DNR without CPR, intubation or significant resuscitation as this would be "inhumane" given her  advanced age, dementia and osteoporotic status.  CODE STATUS changed to DNR.   DVT prophylaxis: enoxaparin (LOVENOX) injection 40 mg Start: 10/12/23 2200    Code Status: Limited: Do not attempt resuscitation (DNR) -DNR-LIMITED -Do Not Intubate/DNI  Family Communication: No family present at bedside this morning, updated patient's daughter Kathy Skinner via telephone yesterday  Disposition Plan:  Level of care: Med-Surg Status is: Inpatient Remains inpatient appropriate because: Needs further weaning from supplemental oxygen      Consultants:  None  Procedures:  TTE  Antimicrobials:  None   Subjective: Patient seen examined bedside, resting calmly.  Lying in bed.  Sleeping.  Responds to simple questions by nodding her head.  No family present at bedside.  Updated patient's daughter via telephone yesterday, she reported that her daughter likes chocolate boost shakes, when nurse attempted to give it to her she refused.  Daughter also reported that she will be bringing in her Trelegy inhaler, no inhaler present at bedside this morning.  Messaged RN and pharmacist, also does not have inhaler in their possession.   Denies headache, no chest pain, no abdominal pain, no fever, no nausea/vomiting/diarrhea.  No acute concerns per nursing staff.     Objective: Vitals:   10/18/23 2250 10/19/23 0500 10/19/23 0530 10/19/23 1042  BP:   124/80   Pulse:   71   Resp:   16   Temp:   97.7 F (36.5 C)   TempSrc:   Oral   SpO2: 91%  92% 93%  Weight:  57.7 kg    Height:        Intake/Output Summary (Last 24 hours) at 10/19/2023 1145 Last data filed at 10/19/2023 4782 Gross per 24 hour  Intake 430.17 ml  Output 0 ml  Net 430.17 ml   Filed Weights   10/17/23 0500 10/18/23 0522 10/19/23 0500  Weight: 57.3 kg 56 kg 57.7 kg    Examination:  Physical Exam: GEN: NAD, alert, elderly in appearance HEENT: NCAT, PERRL, EOMI, sclera clear, MMM PULM: Breath sounds slight diminished bilateral bases  without wheezing/crackles, normal respiratory effort without accessory muscle use, on 3 L nasal cannula with SpO2's 93% at rest CV: RRR w/o M/G/R GI: abd soft, NTND, + BS MSK: no peripheral edema, moves lower extremities independently NEURO: No focal neurological deficits PSYCH: normal mood/affect Integumentary: No rashes/lesions/wounds noted on exposed skin surfaces    Data Reviewed: I have personally reviewed following labs and imaging studies  CBC: Recent Labs  Lab 10/12/23 1542 10/13/23 0720 10/15/23 0501 10/16/23 0503 10/17/23 0524 10/18/23 0459 10/19/23 0505  WBC 6.3   < > 8.6 10.7* 11.3* 8.6 8.0  NEUTROABS 4.9  --   --   --   --   --   --   HGB 15.2*   < > 14.5 15.0 15.9* 16.0* 16.1*  HCT 46.2*   < > 45.4 46.2* 47.7* 49.4* 48.8*  MCV 94.7   < > 96.6 94.5 93.9 95.0 94.0  PLT 237   < > 264 272 270 253 257   < > = values in this interval not displayed.   Basic Metabolic Panel: Recent Labs  Lab 10/12/23 1542 10/13/23 0602 10/14/23 0536 10/15/23 0501 10/16/23 0503 10/17/23 0902 10/18/23 0459 10/19/23 0505  NA 143   < > 134* 139 137 134* 132* 132*  K 3.2*   < > 4.1 4.3 4.0 3.3* 3.7 3.8  CL 102   < > 101 101 96* 94* 95* 94*  CO2 29   < > 24 28 27 27 25 26   GLUCOSE 112*   < > 95 134* 127* 100* 121* 135*  BUN 13   < > 21 16 23  26* 23 30*  CREATININE 0.47   < > 0.50 0.35* 0.62 0.56 0.48 0.48  CALCIUM 7.9*   < > 7.7* 8.0* 7.8* 7.8* 7.6* 7.8*  MG 2.9*  --  2.8* 2.7* 2.6*  --  2.7*  --    < > = values in this interval not displayed.   GFR: Estimated Creatinine Clearance: 49.7 mL/min (by C-G formula based on SCr of 0.48 mg/dL). Liver Function Tests: Recent Labs  Lab 10/12/23 1542 10/13/23 0602 10/19/23 0505  AST 17 15 17   ALT 14 12 18   ALKPHOS 78 65 69  BILITOT 1.2 1.0 0.7  PROT 7.8 6.7 7.1  ALBUMIN 3.0* 2.5* 2.9*   No results for input(s): "LIPASE", "AMYLASE" in the last 168 hours. No results for input(s): "AMMONIA" in the last 168 hours. Coagulation  Profile: No results for input(s): "INR", "PROTIME" in the last 168 hours. Cardiac Enzymes: No results for input(s): "CKTOTAL", "CKMB", "CKMBINDEX", "TROPONINI" in the last 168 hours. BNP (last 3 results) No results for input(s): "PROBNP" in the last 8760 hours. HbA1C: No results for input(s): "HGBA1C" in the last 72 hours. CBG: No results for input(s): "GLUCAP" in the last 168 hours. Lipid Profile: No results for input(s): "CHOL", "HDL", "LDLCALC", "TRIG", "CHOLHDL", "LDLDIRECT" in the last 72 hours. Thyroid Function Tests: No results for input(s): "TSH", "T4TOTAL", "FREET4", "T3FREE", "THYROIDAB" in the last 72 hours. Anemia Panel: No results for input(s): "VITAMINB12", "FOLATE", "FERRITIN", "TIBC", "IRON", "RETICCTPCT" in the last 72 hours. Sepsis Labs: Recent Labs  Lab 10/17/23 0902  PROCALCITON <0.10    Recent Results (from the past 240 hours)  Resp panel by RT-PCR (RSV, Flu A&B, Covid) Anterior Nasal Swab     Status: None   Collection Time: 10/12/23  2:53 PM   Specimen: Anterior Nasal Swab  Result Value Ref Range Status   SARS Coronavirus 2 by RT PCR NEGATIVE NEGATIVE Final    Comment: (NOTE) SARS-CoV-2 target nucleic acids are NOT DETECTED.  The SARS-CoV-2 RNA is generally detectable in upper respiratory specimens during the acute phase of infection. The lowest concentration of SARS-CoV-2 viral copies this assay can detect is 138 copies/mL. A negative result does not preclude SARS-Cov-2 infection and should not be used as the sole basis for treatment or other patient management decisions. A negative result may occur with  improper specimen collection/handling, submission of specimen other than nasopharyngeal swab, presence of viral mutation(s) within the areas targeted by this assay, and inadequate number of viral copies(<138 copies/mL). A negative result must be combined with clinical observations, patient history, and epidemiological information. The expected result  is Negative.  Fact Sheet for Patients:  BloggerCourse.com  Fact Sheet for Healthcare Providers:  SeriousBroker.it  This test is no t yet approved or cleared by the [place] FDA and  has been authorized for detection and/or diagnosis of SARS-CoV-2 by FDA under an Emergency Use Authorization (EUA). This EUA will remain  in effect (meaning this test can be used) for the duration of the COVID-19 declaration under Section 564(b)(1) of the Act, 21 U.S.C.section 360bbb-3(b)(1), unless the authorization is terminated  or revoked sooner.  Influenza A by PCR NEGATIVE NEGATIVE Final   Influenza B by PCR NEGATIVE NEGATIVE Final    Comment: (NOTE) The Xpert Xpress SARS-CoV-2/FLU/RSV plus assay is intended as an aid in the diagnosis of influenza from Nasopharyngeal swab specimens and should not be used as a sole basis for treatment. Nasal washings and aspirates are unacceptable for Xpert Xpress SARS-CoV-2/FLU/RSV testing.  Fact Sheet for Patients: BloggerCourse.com  Fact Sheet for Healthcare Providers: SeriousBroker.it  This test is not yet approved or cleared by the [place] FDA and has been authorized for detection and/or diagnosis of SARS-CoV-2 by FDA under an Emergency Use Authorization (EUA). This EUA will remain in effect (meaning this test can be used) for the duration of the COVID-19 declaration under Section 564(b)(1) of the Act, 21 U.S.C. section 360bbb-3(b)(1), unless the authorization is terminated or revoked.     Resp Syncytial Virus by PCR NEGATIVE NEGATIVE Final    Comment: (NOTE) Fact Sheet for Patients: BloggerCourse.com  Fact Sheet for Healthcare Providers: SeriousBroker.it  This test is not yet approved or cleared by the [place] FDA and has been authorized for detection and/or diagnosis of  SARS-CoV-2 by FDA under an Emergency Use Authorization (EUA). This EUA will remain in effect (meaning this test can be used) for the duration of the COVID-19 declaration under Section 564(b)(1) of the Act, 21 U.S.C. section 360bbb-3(b)(1), unless the authorization is terminated or revoked.  Performed at [facility], [address] [place], [place] [address]   Respiratory (~20 pathogens) panel by PCR     Status: None   Collection Time: 10/12/23  2:53 PM   Specimen: Nasopharyngeal Swab; Respiratory  Result Value Ref Range Status   Adenovirus NOT DETECTED NOT DETECTED Final   Coronavirus 229E NOT DETECTED NOT DETECTED Final    Comment: (NOTE) The Coronavirus on the Respiratory Panel, DOES NOT test for the novel  Coronavirus (2019 nCoV)    Coronavirus HKU1 NOT DETECTED NOT DETECTED Final   Coronavirus NL63 NOT DETECTED NOT DETECTED Final   Coronavirus OC43 NOT DETECTED NOT DETECTED Final   Metapneumovirus NOT DETECTED NOT DETECTED Final   Rhinovirus / Enterovirus NOT DETECTED NOT DETECTED Final   Influenza A NOT DETECTED NOT DETECTED Final   Influenza B NOT DETECTED NOT DETECTED Final   Parainfluenza Virus 1 NOT DETECTED NOT DETECTED Final   Parainfluenza Virus 2 NOT DETECTED NOT DETECTED Final   Parainfluenza Virus 3 NOT DETECTED NOT DETECTED Final   Parainfluenza Virus 4 NOT DETECTED NOT DETECTED Final   Respiratory Syncytial Virus NOT DETECTED NOT DETECTED Final   Bordetella pertussis NOT DETECTED NOT DETECTED Final   Bordetella Parapertussis NOT DETECTED NOT DETECTED Final   Chlamydophila pneumoniae NOT DETECTED NOT DETECTED Final   Mycoplasma pneumoniae NOT DETECTED NOT DETECTED Final    Comment: Performed at [facility], [address], [place], [place] [address]  MRSA Next Gen by PCR, Nasal     Status: Abnormal   Collection Time: 10/15/23 12:06 AM   Specimen: Nasal Mucosa; Nasal Swab  Result Value Ref Range Status   MRSA by PCR Next Gen DETECTED  (A) NOT DETECTED Final    Comment: (NOTE) The GeneXpert MRSA Assay (FDA approved for NASAL specimens only), is one component of a comprehensive MRSA colonization surveillance program. It is not intended to diagnose MRSA infection nor to guide or monitor treatment for MRSA infections. Test performance is not FDA approved in patients less than 79 years old. Performed at [facility], [address], [place], [place] [address]          Radiology Studies: CT Angio Chest Pulmonary Embolism (PE) W or WO Contrast Result Date: 10/17/2023 CLINICAL DATA:  Weakness, cough, hypoxia. EXAM: CT ANGIOGRAPHY CHEST WITH CONTRAST TECHNIQUE: Multidetector CT imaging of the chest was performed using the standard protocol during bolus administration of intravenous contrast. Multiplanar CT image reconstructions and MIPs were obtained to evaluate the vascular anatomy. RADIATION DOSE REDUCTION: This exam was performed according to the departmental dose-optimization program which includes automated exposure control, adjustment of the mA and/or kV according to patient size and/or use of iterative reconstruction technique. CONTRAST:  75mL OMNIPAQUE IOHEXOL 350 MG/ML SOLN COMPARISON:  02/05/2015. FINDINGS: Cardiovascular: Ascending thoracic aorta aneurysm 4 cm. No dissection. No pulmonary artery filling defects to suggest PE. No cardiomegaly or pericardial effusion. Atheromatous calcifications of the aorta and coronary arteries. Mediastinum/Nodes: No enlarged mediastinal, hilar, or axillary lymph nodes. Thyroid gland, trachea, and esophagus demonstrate no significant findings. Lungs/Pleura: Dense consolidation or volume loss identified in the lung bases bilaterally. Bilateral lower lobe nodular interstitial process consistent with inflammatory process or infection. Follow up recommended to ensure resolution of this finding. Consider repeat CT in 3 months. Emphysematous scarring identified in the upper lungs.  No pneumothorax or pleural effusions. Upper Abdomen: No acute abnormality. Musculoskeletal: Thoracic degenerative disc disease. Osteopenia. Lower thoracic compression deformity is unchanged. Review of the MIP images confirms the above findings. IMPRESSION: 1. No evidence of PE or dissection. 2. 4 cm ascending thoracic aorta aneurysm. 3. Bilateral lower lobe consolidation or volume loss. 4. Bilateral lower lobe nodular interstitial process consistent with inflammatory process or infection. Follow up recommended to ensure resolution of this finding. 5. Aortic and coronary artery atherosclerosis. Aortic Atherosclerosis (ICD10-I70.0) and Emphysema (ICD10-J43.9). Electronically Signed   By: Layla Maw M.D.   On: 10/17/2023 16:12         Scheduled Meds:  arformoterol  15 mcg Nebulization BID   azithromycin  500 mg Oral Daily   budesonide (PULMICORT) nebulizer solution  0.5 mg Nebulization BID   busPIRone  5 mg Oral TID   Chlorhexidine Gluconate Cloth  6 each Topical Daily   donepezil  5 mg Oral Daily   enoxaparin (LOVENOX) injection  40 mg Subcutaneous Q24H   escitalopram  10 mg Oral Daily   folic acid  1 mg Oral Daily   ipratropium-albuterol  3 mL Nebulization BID   lactose free nutrition  237 mL Oral TID BM   memantine  10 mg Oral BID   methylPREDNISolone (SOLU-MEDROL) injection  40 mg Intravenous BID   montelukast  10 mg Oral q morning   multivitamin with minerals  1 tablet Oral Daily   mupirocin ointment  1 Application Nasal BID   pravastatin  10 mg Oral Daily   traZODone  100 mg Oral QHS   Continuous Infusions:  cefTRIAXone (ROCEPHIN)  IV Stopped (10/18/23 1725)     LOS: 4 days    Time spent: 54 minutes spent on chart review, discussion with nursing staff, consultants, updating family and interview/physical exam; more than 50% of that time was spent in counseling and/or coordination of care.    Alvira Philips [place], DO [facility] Available via Epic secure chat  7am-7pm After these hours, please refer to coverage provider listed on [email or web address] 10/19/2023, 11:45 AM

## 2023-10-19 NOTE — Progress Notes (Signed)
 Fluticasone- Umeclidin-Vilanterol, aka, Trelegy (shows on RT Worklist but not MAR). This medication is scheduled to start 10/19/23 @ 1600. This med will not be given at 1600- Duoneb (Atrovent Anticholinergic), Pulmicort (Corticosteroid), Brovana (LABA) was given previously.

## 2023-10-20 DIAGNOSIS — J9621 Acute and chronic respiratory failure with hypoxia: Secondary | ICD-10-CM | POA: Diagnosis not present

## 2023-10-20 LAB — BASIC METABOLIC PANEL
Anion gap: 7 (ref 5–15)
BUN: 33 mg/dL — ABNORMAL HIGH (ref 8–23)
CO2: 30 mmol/L (ref 22–32)
Calcium: 8.8 mg/dL — ABNORMAL LOW (ref 8.9–10.3)
Chloride: 94 mmol/L — ABNORMAL LOW (ref 98–111)
Creatinine, Ser: 0.5 mg/dL (ref 0.44–1.00)
GFR, Estimated: >60 mL/min (ref >60)
Glucose, Bld: 136 mg/dL — ABNORMAL HIGH (ref 70–99)
Potassium: 4 mmol/L (ref 3.5–5.1)
Sodium: 131 mmol/L — ABNORMAL LOW (ref 135–145)

## 2023-10-20 MED ORDER — PREDNISONE 20 MG PO TABS
40.0000 mg | ORAL_TABLET | Freq: Every day | ORAL | Status: DC
  Administered 2023-10-21: 40 mg via ORAL
  Filled 2023-10-20: qty 2

## 2023-10-20 NOTE — Progress Notes (Signed)
 Oxygen Saturations  2LNC: 90%  Room Air: 85%

## 2023-10-20 NOTE — Progress Notes (Addendum)
 Occupational Therapy Treatment Patient Details Name: Kathy Skinner MRN: [id number] DOB: 11-Jun-1946 Today's Date: 10/20/2023   History of present illness 78 yo female admitted with acute on chronic respiratory failure. PMH: anxiety, panic attacks, osteoporosis,COPD, chronic respiratory failure, dementia, vertigo   OT comments  Pt in bed upon arrival and initially resistive to getting OOB, agreeable after much encouragement. Pt sat EOB and participated in UB dressing (gown wet) and grooming tasks, sit- stand x 3 attempts mod A to come to a stand, min A to step pivot to chair. Pt refused walking to bathroom with OT. Pt becoming agitated with OT and began cursing and pinching/squeezing OT's arm. OT able to calm pt down to continue with functional tasks. Pt O2 SATs remained stable throughout this activity.       If plan is discharge home, recommend the following:  A lot of help with walking and/or transfers;A lot of help with bathing/dressing/bathroom;Assistance with feeding;Assistance with cooking/housework;Direct supervision/assist for medications management;Direct supervision/assist for financial management;Assist for transportation;Supervision due to cognitive status   Equipment Recommendations  Other (comment) (TBD at next venue of care)    Recommendations for Other Services      Precautions / Restrictions Precautions Precautions: Fall Precaution/Restrictions Comments: monitor O2 Restrictions Weight Bearing Restrictions Per Provider Order: No       Mobility Bed Mobility Overal bed mobility: Needs Assistance Bed Mobility: Supine to Sit     Supine to sit: Mod assist, HOB elevated     General bed mobility comments: pt resistive intially    Transfers Overall transfer level: Needs assistance Equipment used: 1 person hand held assist, Rolling walker (2 wheels) Transfers: Sit to/from Stand, Bed to chair/wheelchair/BSC Sit to Stand: Mod assist     Step pivot transfers: Min  assist     General transfer comment: mulitimodal cues for hand placement, self assist; initial 3 attempts to come to stand     Balance Overall balance assessment: Needs assistance Sitting-balance support: Feet supported, No upper extremity supported Sitting balance-Leahy Scale: Fair     Standing balance support: Bilateral upper extremity supported, Single extremity supported Standing balance-Leahy Scale: Poor                             ADL either performed or assessed with clinical judgement   ADL Overall ADL's : Needs assistance/impaired Eating/Feeding: Sitting;Set up   Grooming: Wash/dry hands;Wash/dry face;Contact guard assist;Sitting           Upper Body Dressing : Minimal assistance;Sitting       Toilet Transfer: Moderate assistance;Minimal assistance;Stand-pivot Statistician Details (indicate cue type and reason): simulated to recliner, pt refused walking to bathroom                Extremity/Trunk Assessment Upper Extremity Assessment Upper Extremity Assessment: Generalized weakness   Lower Extremity Assessment Lower Extremity Assessment: Defer to PT evaluation        Vision Ability to See in Adequate Light: 0 Adequate Patient Visual Report: No change from baseline     Perception     Praxis     Communication Communication Communication: No apparent difficulties   Cognition Arousal: Alert Behavior During Therapy: Flat affect, Agitated Cognition: History of cognitive impairments, No family/caregiver present to determine baseline             OT - Cognition Comments: pt cursing at therapist and grabbing therapist's arm and squeezing it  Following commands: Impaired Following commands impaired: Follows one step commands with increased time      Cueing   Cueing Techniques: Verbal cues, Gestural cues, Tactile cues  Exercises      Shoulder Instructions       General Comments      Pertinent  Vitals/ Pain       Pain Assessment Pain Assessment: No/denies pain Faces Pain Scale: No hurt  Home Living                                          Prior Functioning/Environment              Frequency  Min 1X/week        Progress Toward Goals  OT Goals(current goals can now be found in the care plan section)  Progress towards OT goals: OT to reassess next treatment     Plan      Co-evaluation                 AM-PAC OT "6 Clicks" Daily Activity     Outcome Measure   Help from another person eating meals?: A Little Help from another person taking care of personal grooming?: A Little Help from another person toileting, which includes using toliet, bedpan, or urinal?: A Lot Help from another person bathing (including washing, rinsing, drying)?: A Lot Help from another person to put on and taking off regular upper body clothing?: A Little Help from another person to put on and taking off regular lower body clothing?: A Lot 6 Click Score: 15    End of Session Equipment Utilized During Treatment: Gait belt  OT Visit Diagnosis: Unsteadiness on feet (R26.81);Other abnormalities of gait and mobility (R26.89);Repeated falls (R29.6);Muscle weakness (generalized) (M62.81)   Activity Tolerance Treatment limited secondary to agitation   Patient Left in bed;with call bell/phone within reach;with bed alarm set;Other (comment)   Nurse Communication Other (comment) (pt participation and behavior)        Time: 1043-1106 OT Time Calculation (min): 23 min  Charges: OT General Charges $OT Visit: 1 Visit OT Treatments $Self Care/Home Management : 8-22 mins $Therapeutic Activity: 8-22 mins    Galen Manila 10/20/2023, 1:55 PM

## 2023-10-20 NOTE — Progress Notes (Signed)
 SATURATION QUALIFICATIONS: (This note is used to comply with regulatory documentation for home oxygen)  Patient Saturations on Room Air at Rest = 85%  Patient Saturations on Room Air while Ambulating/mobilizing = 85%  Patient Saturations on 3 Liters of oxygen while Ambulating/mobilizing = 90%  Please briefly explain why patient needs home oxygen:requires 2L O2 at rest, 3L with activity to maintain adequate O2 saturation.   Delice Bison, PT  Acute Rehab Dept [facility]) [phone]  10/20/2023

## 2023-10-20 NOTE — Progress Notes (Signed)
 PROGRESS NOTE    Kathy Skinner  BJY:[id number] DOB: 12-25-1945 DOA: 10/12/2023 PCP: Laurell Josephs, MD (Inactive)    Brief Narrative:   Kathy Skinner is a 78 y.o. female with past medical history significant for dementia, asthma/COPD, anxiety/panic attacks, osteoporosis who presents to [facility] ED on 2/10 from [facility] memory care unit with weakness, cough and was noted to desaturate to 80% on room air.  Apparently there has been a flu outbreak in the facility.  Patient denied headache, no chest pain, no abdominal pain, no nausea/vomiting/diarrhea.  EMS was activated and patient was brought to the ED for further evaluation and management.  In the ED, temperature 98.7 F, HR 80, RR 18, BP 129/73, SpO2 98% room air.  WBC 6.3, hemoglobin 15.2, platelet count 237.  Sodium 143, potassium 3.2, chloride 102, CO2 29, glucose 112, BUN 13, creatinine 0.47.  AST 17, ALT 14, total bilirubin 1.2.  BNP 129.1.  COVID/influenza/RSV PCR negative.  Respiratory viral panel negative.  Chest x-ray with no acute cardiopulmonary disease process.  Patient received DuoNeb, Solu-Medrol 125 mg IV x 1.  TRH consulted for admission for further evaluation and management of acute hypoxic respite failure secondary to COPD exacerbation.  Assessment & Plan:   Acute hypoxic respiratory failure COPD exacerbation Patient presenting to ED after being found hypoxic at facility with SpO2 80% on room air and placed on supplemental oxygen.  Also has been having generalized weakness over the last several days with known influenza outbreak at the facility; although patient negative for influenza A/B, negative for COVID/RSV and respiratory viral panel were negative.  Chest x-ray with no acute cardiopulmonary disease process. -- Trelegy Ellipta 1 puff daily -- Solu-Medrol 40 mg IV x 1 today, transition to prednisone tomorrow -- Singulair 10 mg p.o. daily -- DuoNebs scheduled every 6 hours -- Continue supplemental  oxygen, maintain SpO2 > 88%; currently on 2 L Lexa with SpO2 94% at rest  Community-acquired pneumonia CT angiogram chest with no evidence of PE or dissection, noted bilateral lower lobe consolidation with nodular interstitial process consistent with infection versus inflammatory process -- Azithromycin 500 mg PO daily x 5 days -- Ceftriaxone 2g IV q24 x 5 days -- CBC daily  Acute on chronic diastolic congestive heart failure BNP elevated on admission 129.1.  TTE with LVEF 65 to 70%, grade 1 diastolic dysfunction, LA mildly dilated. -- Strict I's and O's and daily weights  Concern for intermittent urinary retention RN reports intermittent urinary retention, patient has been urinating independently. -- Continue to monitor urine output closely, bladder scan PRN  Hypokalemia Potassium 4.0 this morning  Anxiety/panic attacks -- Lexapro 10 mg p.o. daily -- BuSpar 5 mg p.o. 3 times daily  Hyperlipidemia -- Pravastatin 10 mg p.o. daily  Dementia -- Delirium precautions -- Get up during the day -- Encourage a familiar face to remain present throughout the day -- Keep blinds open and lights on during daylight hours -- Minimize the use of opioids/benzodiazepines -- Namenda 10 mg p.o. twice daily -- Donepezil 5 mg p.o. daily  Weakness/debility/deconditioning: -- PT/OT evaluation: Recommend home health on return to memory care unit  Adult failure to thrive Patient with poor oral intake, not eating much per RN staff but does drink fluids. -- Nutrition consult for calorie count -- If appetite does not improve may need to consider further goals of care discussions  Ethics: Discussed with patient's daughter, Susie via telephone on 10/14/2023 regarding CODE STATUS.  Patient's wishes are to be  DNR without CPR, intubation or significant resuscitation as this would be "inhumane" given her advanced age, dementia and osteoporotic status.  CODE STATUS changed to DNR.   DVT prophylaxis:  enoxaparin (LOVENOX) injection 40 mg Start: 10/12/23 2200    Code Status: Limited: Do not attempt resuscitation (DNR) -DNR-LIMITED -Do Not Intubate/DNI  Family Communication: No family present at bedside this morning  Disposition Plan:  Level of care: Med-Surg Status is: Inpatient Remains inpatient appropriate because: Needs further weaning from supplemental oxygen      Consultants:  None  Procedures:  TTE  Antimicrobials:  Azithromycin 2/15>> Ceftriaxone 2/15>>   Subjective: Patient seen examined bedside, resting calmly.  Lying in bed.  Sleeping; continues to respond to simple questions, mainly answers with "no".  Reports she is not hungry, has been only drinking cold fluids.  Even refusing chocolate boost.  Plan to complete antibiotics tomorrow, transitioning IV steroids to prednisone tomorrow.  Denies headache, no chest pain, no abdominal pain, no fever, no nausea/vomiting/diarrhea.  No acute concerns per nursing staff.     Objective: Vitals:   10/20/23 0500 10/20/23 0532 10/20/23 1020 10/20/23 1026  BP:  112/78    Pulse:  (!) 57    Resp:  19    Temp:  97.8 F (36.6 C)    TempSrc:  Oral    SpO2:  94% 90% (!) 85%  Weight: 54.4 kg     Height:        Intake/Output Summary (Last 24 hours) at 10/20/2023 1150 Last data filed at 10/20/2023 1000 Gross per 24 hour  Intake 360 ml  Output 1 ml  Net 359 ml   Filed Weights   10/18/23 0522 10/19/23 0500 10/20/23 0500  Weight: 56 kg 57.7 kg 54.4 kg    Examination:  Physical Exam: GEN: NAD, alert, elderly/frail in appearance HEENT: NCAT, PERRL, EOMI, sclera clear, MMM PULM: Breath sounds slight diminished bilateral bases without wheezing/crackles, normal respiratory effort without accessory muscle use, on 2 L nasal cannula with SpO2's 94% at rest CV: RRR w/o M/G/R GI: abd soft, NTND, + BS MSK: no peripheral edema, moves extremities independently NEURO: No focal neurological deficits PSYCH: Depressed mood, flat  affect Integumentary: No rashes/lesions/wounds noted on exposed skin surfaces    Data Reviewed: I have personally reviewed following labs and imaging studies  CBC: Recent Labs  Lab 10/15/23 0501 10/16/23 0503 10/17/23 0524 10/18/23 0459 10/19/23 0505  WBC 8.6 10.7* 11.3* 8.6 8.0  HGB 14.5 15.0 15.9* 16.0* 16.1*  HCT 45.4 46.2* 47.7* 49.4* 48.8*  MCV 96.6 94.5 93.9 95.0 94.0  PLT 264 272 270 253 257   Basic Metabolic Panel: Recent Labs  Lab 10/14/23 0536 10/15/23 0501 10/16/23 0503 10/17/23 0902 10/18/23 0459 10/19/23 0505 10/20/23 0453  NA 134* 139 137 134* 132* 132* 131*  K 4.1 4.3 4.0 3.3* 3.7 3.8 4.0  CL 101 101 96* 94* 95* 94* 94*  CO2 24 28 27 27 25 26 30   GLUCOSE 95 134* 127* 100* 121* 135* 136*  BUN 21 16 23  26* 23 30* 33*  CREATININE 0.50 0.35* 0.62 0.56 0.48 0.48 0.50  CALCIUM 7.7* 8.0* 7.8* 7.8* 7.6* 7.8* 8.8*  MG 2.8* 2.7* 2.6*  --  2.7*  --   --    GFR: Estimated Creatinine Clearance: 45.8 mL/min (by C-G formula based on SCr of 0.5 mg/dL). Liver Function Tests: Recent Labs  Lab 10/19/23 0505  AST 17  ALT 18  ALKPHOS 69  BILITOT 0.7  PROT 7.1  ALBUMIN 2.9*   No results for input(s): "LIPASE", "AMYLASE" in the last 168 hours. No results for input(s): "AMMONIA" in the last 168 hours. Coagulation Profile: No results for input(s): "INR", "PROTIME" in the last 168 hours. Cardiac Enzymes: No results for input(s): "CKTOTAL", "CKMB", "CKMBINDEX", "TROPONINI" in the last 168 hours. BNP (last 3 results) No results for input(s): "PROBNP" in the last 8760 hours. HbA1C: No results for input(s): "HGBA1C" in the last 72 hours. CBG: Recent Labs  Lab 10/19/23 1945  GLUCAP 126*   Lipid Profile: No results for input(s): "CHOL", "HDL", "LDLCALC", "TRIG", "CHOLHDL", "LDLDIRECT" in the last 72 hours. Thyroid Function Tests: No results for input(s): "TSH", "T4TOTAL", "FREET4", "T3FREE", "THYROIDAB" in the last 72 hours. Anemia Panel: No results for  input(s): "VITAMINB12", "FOLATE", "FERRITIN", "TIBC", "IRON", "RETICCTPCT" in the last 72 hours. Sepsis Labs: Recent Labs  Lab 10/17/23 0902  PROCALCITON <0.10    Recent Results (from the past 240 hours)  Resp panel by RT-PCR (RSV, Flu A&B, Covid) Anterior Nasal Swab     Status: None   Collection Time: 10/12/23  2:53 PM   Specimen: Anterior Nasal Swab  Result Value Ref Range Status   SARS Coronavirus 2 by RT PCR NEGATIVE NEGATIVE Final    Comment: (NOTE) SARS-CoV-2 target nucleic acids are NOT DETECTED.  The SARS-CoV-2 RNA is generally detectable in upper respiratory specimens during the acute phase of infection. The lowest concentration of SARS-CoV-2 viral copies this assay can detect is 138 copies/mL. A negative result does not preclude SARS-Cov-2 infection and should not be used as the sole basis for treatment or other patient management decisions. A negative result may occur with  improper specimen collection/handling, submission of specimen other than nasopharyngeal swab, presence of viral mutation(s) within the areas targeted by this assay, and inadequate number of viral copies(<138 copies/mL). A negative result must be combined with clinical observations, patient history, and epidemiological information. The expected result is Negative.  Fact Sheet for Patients:  BloggerCourse.com  Fact Sheet for Healthcare Providers:  SeriousBroker.it  This test is no t yet approved or cleared by the [place] FDA and  has been authorized for detection and/or diagnosis of SARS-CoV-2 by FDA under an Emergency Use Authorization (EUA). This EUA will remain  in effect (meaning this test can be used) for the duration of the COVID-19 declaration under Section 564(b)(1) of the Act, 21 U.S.C.section 360bbb-3(b)(1), unless the authorization is terminated  or revoked sooner.       Influenza A by PCR NEGATIVE NEGATIVE Final   Influenza B  by PCR NEGATIVE NEGATIVE Final    Comment: (NOTE) The Xpert Xpress SARS-CoV-2/FLU/RSV plus assay is intended as an aid in the diagnosis of influenza from Nasopharyngeal swab specimens and should not be used as a sole basis for treatment. Nasal washings and aspirates are unacceptable for Xpert Xpress SARS-CoV-2/FLU/RSV testing.  Fact Sheet for Patients: BloggerCourse.com  Fact Sheet for Healthcare Providers: SeriousBroker.it  This test is not yet approved or cleared by the [place] FDA and has been authorized for detection and/or diagnosis of SARS-CoV-2 by FDA under an Emergency Use Authorization (EUA). This EUA will remain in effect (meaning this test can be used) for the duration of the COVID-19 declaration under Section 564(b)(1) of the Act, 21 U.S.C. section 360bbb-3(b)(1), unless the authorization is terminated or revoked.     Resp Syncytial Virus by PCR NEGATIVE NEGATIVE Final    Comment: (NOTE) Fact Sheet for Patients: BloggerCourse.com  Fact Sheet for Healthcare Providers: SeriousBroker.it  This test is not yet approved or cleared by the [place] and has been authorized for detection and/or diagnosis of SARS-CoV-2 by FDA under an Emergency Use Authorization (EUA). This EUA will remain in effect (meaning this test can be used) for the duration of the COVID-19 declaration under Section 564(b)(1) of the Act, 21 U.S.C. section 360bbb-3(b)(1), unless the authorization is terminated or revoked.  Performed at [facility], [address] [place], [place] [address]   Respiratory (~20 pathogens) panel by PCR     Status: None   Collection Time: 10/12/23  2:53 PM   Specimen: Nasopharyngeal Swab; Respiratory  Result Value Ref Range Status   Adenovirus NOT DETECTED NOT DETECTED Final   Coronavirus 229E NOT DETECTED NOT DETECTED Final    Comment:  (NOTE) The Coronavirus on the Respiratory Panel, DOES NOT test for the novel  Coronavirus (2019 nCoV)    Coronavirus HKU1 NOT DETECTED NOT DETECTED Final   Coronavirus NL63 NOT DETECTED NOT DETECTED Final   Coronavirus OC43 NOT DETECTED NOT DETECTED Final   Metapneumovirus NOT DETECTED NOT DETECTED Final   Rhinovirus / Enterovirus NOT DETECTED NOT DETECTED Final   Influenza A NOT DETECTED NOT DETECTED Final   Influenza B NOT DETECTED NOT DETECTED Final   Parainfluenza Virus 1 NOT DETECTED NOT DETECTED Final   Parainfluenza Virus 2 NOT DETECTED NOT DETECTED Final   Parainfluenza Virus 3 NOT DETECTED NOT DETECTED Final   Parainfluenza Virus 4 NOT DETECTED NOT DETECTED Final   Respiratory Syncytial Virus NOT DETECTED NOT DETECTED Final   Bordetella pertussis NOT DETECTED NOT DETECTED Final   Bordetella Parapertussis NOT DETECTED NOT DETECTED Final   Chlamydophila pneumoniae NOT DETECTED NOT DETECTED Final   Mycoplasma pneumoniae NOT DETECTED NOT DETECTED Final    Comment: Performed at [facility], [address], [place], [place] [address]  MRSA Next Gen by PCR, Nasal     Status: Abnormal   Collection Time: 10/15/23 12:06 AM   Specimen: Nasal Mucosa; Nasal Swab  Result Value Ref Range Status   MRSA by PCR Next Gen DETECTED (A) NOT DETECTED Final    Comment: (NOTE) The GeneXpert MRSA Assay (FDA approved for NASAL specimens only), is one component of a comprehensive MRSA colonization surveillance program. It is not intended to diagnose MRSA infection nor to guide or monitor treatment for MRSA infections. Test performance is not FDA approved in patients less than 43 years old. Performed at [facility], [address] [place], [place] [address]          Radiology Studies: No results found.        Scheduled Meds:  azithromycin  500 mg Oral Daily   busPIRone  5 mg Oral TID   donepezil  5 mg Oral Daily   enoxaparin (LOVENOX) injection  40 mg  Subcutaneous Q24H   escitalopram  10 mg Oral Daily   Fluticasone-Umeclidin-Vilant  1 puff Inhalation Daily   folic acid  1 mg Oral Daily   lactose free nutrition  237 mL Oral TID BM   memantine  10 mg Oral BID   methylPREDNISolone (SOLU-MEDROL) injection  40 mg Intravenous BID   montelukast  10 mg Oral q morning   multivitamin with minerals  1 tablet Oral Daily   mupirocin ointment  1 Application Nasal BID   pravastatin  10 mg Oral Daily   traZODone  100 mg Oral QHS   Continuous Infusions:  cefTRIAXone (ROCEPHIN)  IV 2 g (  10/19/23 1750)     LOS: 5 days    Time spent: 51 minutes spent on chart review, discussion with nursing staff, consultants, updating family and interview/physical exam; more than 50% of that time was spent in counseling and/or coordination of care.    Alvira Philips [place], DO [facility] Available via Epic secure chat 7am-7pm After these hours, please refer to coverage provider listed on [email or web address] 10/20/2023, 11:50 AM

## 2023-10-20 NOTE — Progress Notes (Signed)
 Attempted to feed pt breakfast this morning. Pt refused.   Also brought chocolate Boost. Pt took one sip and refused any further.   Pt does love ice water. Brought a fresh cup to her room and will offer frequently throughout the day.

## 2023-10-20 NOTE — Progress Notes (Signed)
 Physical Therapy Treatment Patient Details Name: Kathy Skinner MRN: [id number] DOB: 29-Oct-1945 Today's Date: 10/20/2023   History of Present Illness 78 yo female admitted with acute on chronic respiratory failure. PMH: anxiety, panic attacks, osteoporosis,COPD, chronic respiratory failure, dementia, vertigo    PT Comments  Pt progressing slowly toward goals. Limited participation, requires firm encouragement d/t cognition. Pt is able to follow one step commands during PT session however declines any activity other than repeated standing.  Pt requires mod assist to stand and +2 for safety. Pt was amb without device at facility prior to admission, per TOC ALF unable to provide incr physical assist, therefore pt will need post acute rehab.  Patient will benefit from continued follow up therapy, <3 hours/day at d/c  SpO2=88% at rest on 2L, decr to 84% on RA, requiring 3L to return to >87% during and after activity.   Continue PT in acute setting   If plan is discharge home, recommend the following: A lot of help with walking and/or transfers;Direct supervision/assist for medications management;Assist for transportation;Direct supervision/assist for financial management;A lot of help with bathing/dressing/bathroom;Assistance with cooking/housework;Supervision due to cognitive status;Help with stairs or ramp for entrance   Can travel by private vehicle     No  Equipment Recommendations  None recommended by PT    Recommendations for Other Services       Precautions / Restrictions Precautions Precautions: Fall Precaution/Restrictions Comments: monitor O2 Restrictions Weight Bearing Restrictions Per Provider Order: No     Mobility  Bed Mobility               General bed mobility comments:  (pt in recliner and returned to same)    Transfers Overall transfer level: Needs assistance Equipment used: 1 person hand held assist, 2 person hand held assist, Rolling walker (2  wheels) Transfers: Sit to/from Stand Sit to Stand: Mod assist, +2 safety/equipment, Min assist, +2 physical assistance           General transfer comment: mulit-modal cues for hand placement, self assist;initial 2 attempts pt placing very little effort 3 attempt to come to stand; bed pad used on 3rd attempt to lift hips off chair and transfer wt up and over BOS.  pt able to maintain standing briefly, then sat in recliner and refused to attempt again.    Ambulation/Gait               General Gait Details: pt declined today   Stairs             Wheelchair Mobility     Tilt Bed    Modified Rankin (Stroke Patients Only)       Balance   Sitting-balance support: Feet supported, No upper extremity supported Sitting balance-Leahy Scale: Fair     Standing balance support: Bilateral upper extremity supported, Single extremity supported Standing balance-Leahy Scale: Poor Standing balance comment: briefly able to maintain standing with +2 HHA                            Communication Communication Communication: No apparent difficulties  Cognition Arousal: Alert Behavior During Therapy: Flat affect   PT - Cognitive impairments: History of cognitive impairments                       PT - Cognition Comments: pt is generally uncooperative, follows one step commands with incr time; repeatedly states "I don't care" to most questions/requests Following commands: Impaired  Following commands impaired: Follows one step commands with increased time    Cueing Cueing Techniques: Verbal cues, Gestural cues, Tactile cues  Exercises      General Comments        Pertinent Vitals/Pain Pain Assessment Pain Assessment: Faces Faces Pain Scale: No hurt    Home Living                          Prior Function            PT Goals (current goals can now be found in the care plan section) Acute Rehab PT Goals Patient Stated Goal: none  stated PT Goal Formulation: Patient unable to participate in goal setting Time For Goal Achievement: 10/28/23 Potential to Achieve Goals: Fair Progress towards PT goals: Progressing toward goals (slowly)    Frequency    Min 1X/week      PT Plan      Co-evaluation              AM-PAC PT "6 Clicks" Mobility   Outcome Measure  Help needed turning from your back to your side while in a flat bed without using bedrails?: A Little Help needed moving from lying on your back to sitting on the side of a flat bed without using bedrails?: A Little Help needed moving to and from a bed to a chair (including a wheelchair)?: A Lot Help needed standing up from a chair using your arms (e.g., wheelchair or bedside chair)?: A Lot Help needed to walk in hospital room?: Total Help needed climbing 3-5 steps with a railing? : Total 6 Click Score: 12    End of Session Equipment Utilized During Treatment: Oxygen Activity Tolerance: Other (comment) (limited by cognition) Patient left: in chair;with call bell/phone within reach;with chair alarm set Nurse Communication: Mobility status PT Visit Diagnosis: Muscle weakness (generalized) (M62.81);Difficulty in walking, not elsewhere classified (R26.2);History of falling (Z91.81)     Time: 2130-8657 PT Time Calculation (min) (ACUTE ONLY): 18 min  Charges:    $Therapeutic Activity: 8-22 mins PT General Charges $$ ACUTE PT VISIT: 1 Visit                     Ahmed Inniss, PT  Acute Rehab Dept [facility]) [phone]  10/20/2023    [facility] 10/20/2023, 12:49 PM

## 2023-10-20 NOTE — Plan of Care (Signed)
  Problem: Clinical Measurements: Goal: Will remain free from infection Outcome: Progressing Goal: Respiratory complications will improve Outcome: Progressing   Problem: Health Behavior/Discharge Planning: Goal: Ability to manage health-related needs will improve Outcome: Not Progressing   

## 2023-10-21 DIAGNOSIS — J9621 Acute and chronic respiratory failure with hypoxia: Secondary | ICD-10-CM | POA: Diagnosis not present

## 2023-10-21 MED ORDER — PREDNISONE 5 MG PO TABS
30.0000 mg | ORAL_TABLET | Freq: Every day | ORAL | Status: AC
  Administered 2023-10-23 – 2023-10-24 (×2): 30 mg via ORAL
  Filled 2023-10-21 (×2): qty 2

## 2023-10-21 MED ORDER — PREDNISONE 20 MG PO TABS
40.0000 mg | ORAL_TABLET | Freq: Every day | ORAL | Status: AC
  Administered 2023-10-22: 40 mg via ORAL
  Filled 2023-10-21: qty 2

## 2023-10-21 MED ORDER — PREDNISONE 5 MG PO TABS
10.0000 mg | ORAL_TABLET | Freq: Every day | ORAL | Status: AC
  Administered 2023-10-27 – 2023-10-28 (×2): 10 mg via ORAL
  Filled 2023-10-21 (×2): qty 2

## 2023-10-21 MED ORDER — PREDNISONE 20 MG PO TABS
20.0000 mg | ORAL_TABLET | Freq: Every day | ORAL | Status: AC
  Administered 2023-10-25 – 2023-10-26 (×2): 20 mg via ORAL
  Filled 2023-10-21 (×2): qty 1

## 2023-10-21 NOTE — Progress Notes (Addendum)
 PROGRESS NOTE    Kathy Skinner  ZOX:[id number] DOB: [birth year] DOA: 10/12/2023 PCP: Laurell Josephs, MD (Inactive)    Brief Narrative:   Kathy Skinner is a 78 y.o. female with past medical history significant for dementia, asthma/COPD, anxiety/panic attacks, osteoporosis who presents to [facility] ED on 2/10 from [facility] memory care unit with weakness, cough and was noted to desaturate to 80% on room air.  Apparently there has been a flu outbreak in the facility.  Patient denied headache, no chest pain, no abdominal pain, no nausea/vomiting/diarrhea.  EMS was activated and patient was brought to the ED for further evaluation and management.  In the ED, temperature 98.7 F, HR 80, RR 18, BP 129/73, SpO2 98% room air.  WBC 6.3, hemoglobin 15.2, platelet count 237.  Sodium 143, potassium 3.2, chloride 102, CO2 29, glucose 112, BUN 13, creatinine 0.47.  AST 17, ALT 14, total bilirubin 1.2.  BNP 129.1.  COVID/influenza/RSV PCR negative.  Respiratory viral panel negative.  Chest x-ray with no acute cardiopulmonary disease process.  Patient received DuoNeb, Solu-Medrol 125 mg IV x 1.  TRH consulted for admission for further evaluation and management of acute hypoxic respite failure secondary to COPD exacerbation.  Assessment & Plan:   Acute hypoxic respiratory failure COPD exacerbation Patient presenting to ED after being found hypoxic at facility with SpO2 80% on room air and placed on supplemental oxygen.  Also has been having generalized weakness over the last several days with known influenza outbreak at the facility; although patient negative for influenza A/B, negative for COVID/RSV and respiratory viral panel were negative.  Chest x-ray with no acute cardiopulmonary disease process. -- Trelegy Ellipta 1 puff daily -- Prednisone taper -- Singulair 10 mg p.o. daily -- DuoNebs scheduled every 6 hours -- Continue supplemental oxygen, maintain SpO2 > 88%; currently on 2 L [place] with  SpO2 95% at rest  Community-acquired pneumonia CT angiogram chest with no evidence of PE or dissection, noted bilateral lower lobe consolidation with nodular interstitial process consistent with infection versus inflammatory process -- Azithromycin 500 mg PO daily x 5 days -- Ceftriaxone 2g IV q24 x 5 days -- CBC daily  Acute on chronic diastolic congestive heart failure BNP elevated on admission 129.1.  TTE with LVEF 65 to 70%, grade 1 diastolic dysfunction, LA mildly dilated. -- Strict I's and O's and daily weights  Concern for intermittent urinary retention RN reports intermittent urinary retention, patient has been urinating independently. -- Continue to monitor urine output closely, bladder scan PRN  Hypokalemia Potassium 4.0 this morning  Anxiety/panic attacks -- Lexapro 10 mg p.o. daily -- BuSpar 5 mg p.o. 3 times daily  Hyperlipidemia -- Pravastatin 10 mg p.o. daily  Dementia -- Delirium precautions -- Get up during the day -- Encourage a familiar face to remain present throughout the day -- Keep blinds open and lights on during daylight hours -- Minimize the use of opioids/benzodiazepines -- Namenda 10 mg p.o. twice daily -- Donepezil 5 mg p.o. daily  Weakness/debility/deconditioning: -- PT/OT evaluation: Recommend home health on return to memory care unit  Adult failure to thrive Patient with poor oral intake, not eating much per RN staff but does drink fluids. -- Nutrition consult for calorie count -- If appetite does not improve may need to consider further goals of care discussions  Ethics: Discussed with patient's daughter, Susie via telephone on 10/14/2023 regarding CODE STATUS.  Patient's wishes are to be DNR without CPR, intubation or significant resuscitation as this  would be "inhumane" given her advanced age, dementia and osteoporotic status.  CODE STATUS changed to DNR.   DVT prophylaxis: enoxaparin (LOVENOX) injection 40 mg Start: 10/12/23 2200     Code Status: Limited: Do not attempt resuscitation (DNR) -DNR-LIMITED -Do Not Intubate/DNI  Family Communication: No family present at bedside this morning  Disposition Plan:  Level of care: Med-Surg Status is: Inpatient Remains inpatient appropriate because: Needs further weaning from supplemental oxygen, likely needs SNF placement, TOC consulted   Consultants:  None  Procedures:  TTE  Antimicrobials:  Azithromycin 2/15>> Ceftriaxone 2/15>>   Subjective: Patient seen examined bedside, resting calmly.  Lying in bed.  Alert.  Asking for "chocolate milk".  Brought chocolate boost at bedside.  Denies headache, no chest pain, no shortness of breath, no abdominal pain.  States she is not hungry this morning.  Remains on 2 L nasal cannula. No acute concerns per nursing staff.     Objective: Vitals:   10/20/23 1352 10/20/23 1639 10/20/23 2145 10/21/23 0646  BP: 117/68  112/72 111/75  Pulse: (!) 58  (!) 55 (!) 53  Resp: 20  15 18   Temp: 97.8 F (36.6 C)  97.8 F (36.6 C) 97.7 F (36.5 C)  TempSrc: Oral  Oral   SpO2: 93% 94% 95% 95%  Weight:      Height:        Intake/Output Summary (Last 24 hours) at 10/21/2023 1114 Last data filed at 10/21/2023 1000 Gross per 24 hour  Intake 680.07 ml  Output --  Net 680.07 ml   Filed Weights   10/18/23 0522 10/19/23 0500 10/20/23 0500  Weight: 56 kg 57.7 kg 54.4 kg    Examination:  Physical Exam: GEN: NAD, alert, elderly/frail in appearance HEENT: NCAT, PERRL, EOMI, sclera clear, MMM PULM: Breath sounds slight diminished bilateral bases without wheezing/crackles, normal respiratory effort without accessory muscle use, on 2 L nasal cannula with SpO2's 95% at rest CV: RRR w/o M/G/R GI: abd soft, NTND, + BS MSK: no peripheral edema, moves extremities independently NEURO: No focal neurological deficits PSYCH: Depressed mood, flat affect Integumentary: No rashes/lesions/wounds noted on exposed skin surfaces    Data Reviewed: I  have personally reviewed following labs and imaging studies  CBC: Recent Labs  Lab 10/15/23 0501 10/16/23 0503 10/17/23 0524 10/18/23 0459 10/19/23 0505  WBC 8.6 10.7* 11.3* 8.6 8.0  HGB 14.5 15.0 15.9* 16.0* 16.1*  HCT 45.4 46.2* 47.7* 49.4* 48.8*  MCV 96.6 94.5 93.9 95.0 94.0  PLT 264 272 270 253 257   Basic Metabolic Panel: Recent Labs  Lab 10/15/23 0501 10/16/23 0503 10/17/23 0902 10/18/23 0459 10/19/23 0505 10/20/23 0453  NA 139 137 134* 132* 132* 131*  K 4.3 4.0 3.3* 3.7 3.8 4.0  CL 101 96* 94* 95* 94* 94*  CO2 28 27 27 25 26 30   GLUCOSE 134* 127* 100* 121* 135* 136*  BUN 16 23 26* 23 30* 33*  CREATININE 0.35* 0.62 0.56 0.48 0.48 0.50  CALCIUM 8.0* 7.8* 7.8* 7.6* 7.8* 8.8*  MG 2.7* 2.6*  --  2.7*  --   --    GFR: Estimated Creatinine Clearance: 45.8 mL/min (by C-G formula based on SCr of 0.5 mg/dL). Liver Function Tests: Recent Labs  Lab 10/19/23 0505  AST 17  ALT 18  ALKPHOS 69  BILITOT 0.7  PROT 7.1  ALBUMIN 2.9*   No results for input(s): "LIPASE", "AMYLASE" in the last 168 hours. No results for input(s): "AMMONIA" in the last 168 hours. Coagulation  Profile: No results for input(s): "INR", "PROTIME" in the last 168 hours. Cardiac Enzymes: No results for input(s): "CKTOTAL", "CKMB", "CKMBINDEX", "TROPONINI" in the last 168 hours. BNP (last 3 results) No results for input(s): "PROBNP" in the last 8760 hours. HbA1C: No results for input(s): "HGBA1C" in the last 72 hours. CBG: Recent Labs  Lab 10/19/23 1945  GLUCAP 126*   Lipid Profile: No results for input(s): "CHOL", "HDL", "LDLCALC", "TRIG", "CHOLHDL", "LDLDIRECT" in the last 72 hours. Thyroid Function Tests: No results for input(s): "TSH", "T4TOTAL", "FREET4", "T3FREE", "THYROIDAB" in the last 72 hours. Anemia Panel: No results for input(s): "VITAMINB12", "FOLATE", "FERRITIN", "TIBC", "IRON", "RETICCTPCT" in the last 72 hours. Sepsis Labs: Recent Labs  Lab 10/17/23 0902  PROCALCITON  <0.10    Recent Results (from the past 240 hours)  Resp panel by RT-PCR (RSV, Flu A&B, Covid) Anterior Nasal Swab     Status: None   Collection Time: 10/12/23  2:53 PM   Specimen: Anterior Nasal Swab  Result Value Ref Range Status   SARS Coronavirus 2 by RT PCR NEGATIVE NEGATIVE Final    Comment: (NOTE) SARS-CoV-2 target nucleic acids are NOT DETECTED.  The SARS-CoV-2 RNA is generally detectable in upper respiratory specimens during the acute phase of infection. The lowest concentration of SARS-CoV-2 viral copies this assay can detect is 138 copies/mL. A negative result does not preclude SARS-Cov-2 infection and should not be used as the sole basis for treatment or other patient management decisions. A negative result may occur with  improper specimen collection/handling, submission of specimen other than nasopharyngeal swab, presence of viral mutation(s) within the areas targeted by this assay, and inadequate number of viral copies(<138 copies/mL). A negative result must be combined with clinical observations, patient history, and epidemiological information. The expected result is Negative.  Fact Sheet for Patients:  BloggerCourse.com  Fact Sheet for Healthcare Providers:  SeriousBroker.it  This test is no t yet approved or cleared by the [place] FDA and  has been authorized for detection and/or diagnosis of SARS-CoV-2 by FDA under an Emergency Use Authorization (EUA). This EUA will remain  in effect (meaning this test can be used) for the duration of the COVID-19 declaration under Section 564(b)(1) of the Act, 21 U.S.C.section 360bbb-3(b)(1), unless the authorization is terminated  or revoked sooner.       Influenza A by PCR NEGATIVE NEGATIVE Final   Influenza B by PCR NEGATIVE NEGATIVE Final    Comment: (NOTE) The Xpert Xpress SARS-CoV-2/FLU/RSV plus assay is intended as an aid in the diagnosis of influenza from  Nasopharyngeal swab specimens and should not be used as a sole basis for treatment. Nasal washings and aspirates are unacceptable for Xpert Xpress SARS-CoV-2/FLU/RSV testing.  Fact Sheet for Patients: BloggerCourse.com  Fact Sheet for Healthcare Providers: SeriousBroker.it  This test is not yet approved or cleared by the [place] FDA and has been authorized for detection and/or diagnosis of SARS-CoV-2 by FDA under an Emergency Use Authorization (EUA). This EUA will remain in effect (meaning this test can be used) for the duration of the COVID-19 declaration under Section 564(b)(1) of the Act, 21 U.S.C. section 360bbb-3(b)(1), unless the authorization is terminated or revoked.     Resp Syncytial Virus by PCR NEGATIVE NEGATIVE Final    Comment: (NOTE) Fact Sheet for Patients: BloggerCourse.com  Fact Sheet for Healthcare Providers: SeriousBroker.it  This test is not yet approved or cleared by the [place] FDA and has been authorized for detection and/or diagnosis of SARS-CoV-2 by FDA  under an Emergency Use Authorization (EUA). This EUA will remain in effect (meaning this test can be used) for the duration of the COVID-19 declaration under Section 564(b)(1) of the Act, 21 U.S.C. section 360bbb-3(b)(1), unless the authorization is terminated or revoked.  Performed at [facility], [address], [place], [place] [address]   Respiratory (~20 pathogens) panel by PCR     Status: None   Collection Time: 10/12/23  2:53 PM   Specimen: Nasopharyngeal Swab; Respiratory  Result Value Ref Range Status   Adenovirus NOT DETECTED NOT DETECTED Final   Coronavirus 229E NOT DETECTED NOT DETECTED Final    Comment: (NOTE) The Coronavirus on the Respiratory Panel, DOES NOT test for the novel  Coronavirus (2019 nCoV)    Coronavirus HKU1 NOT DETECTED NOT DETECTED  Final   Coronavirus NL63 NOT DETECTED NOT DETECTED Final   Coronavirus OC43 NOT DETECTED NOT DETECTED Final   Metapneumovirus NOT DETECTED NOT DETECTED Final   Rhinovirus / Enterovirus NOT DETECTED NOT DETECTED Final   Influenza A NOT DETECTED NOT DETECTED Final   Influenza B NOT DETECTED NOT DETECTED Final   Parainfluenza Virus 1 NOT DETECTED NOT DETECTED Final   Parainfluenza Virus 2 NOT DETECTED NOT DETECTED Final   Parainfluenza Virus 3 NOT DETECTED NOT DETECTED Final   Parainfluenza Virus 4 NOT DETECTED NOT DETECTED Final   Respiratory Syncytial Virus NOT DETECTED NOT DETECTED Final   Bordetella pertussis NOT DETECTED NOT DETECTED Final   Bordetella Parapertussis NOT DETECTED NOT DETECTED Final   Chlamydophila pneumoniae NOT DETECTED NOT DETECTED Final   Mycoplasma pneumoniae NOT DETECTED NOT DETECTED Final    Comment: Performed at [facility], [address], [place], [place] [address]  MRSA Next Gen by PCR, Nasal     Status: Abnormal   Collection Time: 10/15/23 12:06 AM   Specimen: Nasal Mucosa; Nasal Swab  Result Value Ref Range Status   MRSA by PCR Next Gen DETECTED (A) NOT DETECTED Final    Comment: (NOTE) The GeneXpert MRSA Assay (FDA approved for NASAL specimens only), is one component of a comprehensive MRSA colonization surveillance program. It is not intended to diagnose MRSA infection nor to guide or monitor treatment for MRSA infections. Test performance is not FDA approved in patients less than 6 years old. Performed at [facility], [address], [place], [place] [address]          Radiology Studies: No results found.        Scheduled Meds:  busPIRone  5 mg Oral TID   donepezil  5 mg Oral Daily   enoxaparin (LOVENOX) injection  40 mg Subcutaneous Q24H   escitalopram  10 mg Oral Daily   Fluticasone-Umeclidin-Vilant  1 puff Inhalation Daily   folic acid  1 mg Oral Daily   lactose free nutrition  237 mL Oral TID BM    memantine  10 mg Oral BID   montelukast  10 mg Oral q morning   multivitamin with minerals  1 tablet Oral Daily   pravastatin  10 mg Oral Daily   predniSONE  40 mg Oral Q breakfast   traZODone  100 mg Oral QHS   Continuous Infusions:  cefTRIAXone (ROCEPHIN)  IV 2 g (10/20/23 1743)     LOS: 6 days    Time spent: 46 minutes spent on chart review, discussion with nursing staff, consultants, updating family and interview/physical exam; more than 50% of that time was spent in counseling and/or coordination of  care.    Alvira Philips [place], DO [facility] Available via Epic secure chat 7am-7pm After these hours, please refer to coverage provider listed on [email or web address] 10/21/2023, 11:14 AM

## 2023-10-21 NOTE — Plan of Care (Signed)

## 2023-10-22 DIAGNOSIS — J9621 Acute and chronic respiratory failure with hypoxia: Secondary | ICD-10-CM | POA: Diagnosis not present

## 2023-10-22 NOTE — Progress Notes (Signed)
 PROGRESS NOTE    Brielyn Bosak  UEA:[id number] DOB: 1946-03-17 DOA: 10/12/2023 PCP: Laurell Josephs, MD (Inactive)    Brief Narrative:   Kathy Skinner is a 78 y.o. female with past medical history significant for dementia, asthma/COPD, anxiety/panic attacks, osteoporosis who presents to [facility] ED on 2/10 from [facility] memory care unit with weakness, cough and was noted to desaturate to 80% on room air.  Apparently there has been a flu outbreak in the facility.  Patient denied headache, no chest pain, no abdominal pain, no nausea/vomiting/diarrhea.  EMS was activated and patient was brought to the ED for further evaluation and management.  In the ED, temperature 98.7 F, HR 80, RR 18, BP 129/73, SpO2 98% room air.  WBC 6.3, hemoglobin 15.2, platelet count 237.  Sodium 143, potassium 3.2, chloride 102, CO2 29, glucose 112, BUN 13, creatinine 0.47.  AST 17, ALT 14, total bilirubin 1.2.  BNP 129.1.  COVID/influenza/RSV PCR negative.  Respiratory viral panel negative.  Chest x-ray with no acute cardiopulmonary disease process.  Patient received DuoNeb, Solu-Medrol 125 mg IV x 1.  TRH consulted for admission for further evaluation and management of acute hypoxic respite failure secondary to COPD exacerbation.  Assessment & Plan:   Acute hypoxic respiratory failure COPD exacerbation Patient presenting to ED after being found hypoxic at facility with SpO2 80% on room air and placed on supplemental oxygen.  Also has been having generalized weakness over the last several days with known influenza outbreak at the facility; although patient negative for influenza A/B, negative for COVID/RSV and respiratory viral panel were negative.  Chest x-ray with no acute cardiopulmonary disease process. -- Trelegy Ellipta 1 puff daily -- Prednisone taper -- Singulair 10 mg p.o. daily -- DuoNebs scheduled every 6 hours -- Continue supplemental oxygen, maintain SpO2 > 88%; currently on 2 L [place] with  SpO2 96% at rest  Community-acquired pneumonia CT angiogram chest with no evidence of PE or dissection, noted bilateral lower lobe consolidation with nodular interstitial process consistent with infection versus inflammatory process.  Completed 5-day course of azithromycin and ceftriaxone. -- CBC in the am  Acute on chronic diastolic congestive heart failure BNP elevated on admission 129.1.  TTE with LVEF 65 to 70%, grade 1 diastolic dysfunction, LA mildly dilated. -- Strict I's and O's and daily weights  Concern for intermittent urinary retention RN reports intermittent urinary retention, patient has been urinating independently. -- Continue to monitor urine output closely, bladder scan PRN  Hypokalemia Potassium 4.0 this morning  Anxiety/panic attacks -- Lexapro 10 mg p.o. daily -- BuSpar 5 mg p.o. 3 times daily  Hyperlipidemia -- Pravastatin 10 mg p.o. daily  Dementia -- Delirium precautions -- Get up during the day -- Encourage a familiar face to remain present throughout the day -- Keep blinds open and lights on during daylight hours -- Minimize the use of opioids/benzodiazepines -- Namenda 10 mg p.o. twice daily -- Donepezil 5 mg p.o. daily  Weakness/debility/deconditioning: -- PT/OT evaluation: Recommend home health on return to memory care unit  Adult failure to thrive Patient with poor oral intake, not eating much per RN staff but does drink fluids. -- If appetite does not improve may need to consider further goals of care discussions  Ethics: Discussed with patient's daughter, Susie via telephone on 10/14/2023 regarding CODE STATUS.  Patient's wishes are to be DNR without CPR, intubation or significant resuscitation as this would be "inhumane" given her advanced age, dementia and osteoporotic status.  CODE  STATUS changed to DNR.   DVT prophylaxis: enoxaparin (LOVENOX) injection 40 mg Start: 10/12/23 2200    Code Status: Limited: Do not attempt resuscitation  (DNR) -DNR-LIMITED -Do Not Intubate/DNI  Family Communication: No family present at bedside this morning  Disposition Plan:  Level of care: Med-Surg Status is: Inpatient Remains inpatient appropriate because: [facility] consulted for SNF placement, from memory care facility  Consultants:  None  Procedures:  TTE  Antimicrobials:  Azithromycin 2/15 - 2/19 Ceftriaxone 2/15 2/19   Subjective: Patient seen examined bedside, resting calmly.  Lying in bed.  Alert.  Asking for "chocolate milk".  Brought chocolate boost at bedside.  Denies headache, no chest pain, no shortness of breath, no abdominal pain.  States she is not hungry this morning.  Remains on 2 L nasal cannula. No acute concerns per nursing staff.     Objective: Vitals:   10/21/23 0646 10/21/23 1349 10/21/23 2117 10/22/23 0644  BP: 111/75 109/63 112/67 104/63  Pulse: (!) 53 65 60 (!) 53  Resp: 18 18 17 16   Temp: 97.7 F (36.5 C)  98 F (36.7 C) 97.6 F (36.4 C)  TempSrc:   Oral Oral  SpO2: 95% 91% 96% 93%  Weight:      Height:        Intake/Output Summary (Last 24 hours) at 10/22/2023 1038 Last data filed at 10/22/2023 1610 Gross per 24 hour  Intake 860.07 ml  Output --  Net 860.07 ml   Filed Weights   10/18/23 0522 10/19/23 0500 10/20/23 0500  Weight: 56 kg 57.7 kg 54.4 kg    Examination:  Physical Exam: GEN: NAD, alert, elderly/frail in appearance HEENT: NCAT, PERRL, EOMI, sclera clear, MMM PULM: Breath sounds slight diminished bilateral bases without wheezing/crackles, normal respiratory effort without accessory muscle use, on 2 L nasal cannula with SpO2's 95% at rest CV: RRR w/o M/G/R GI: abd soft, NTND, + BS MSK: no peripheral edema, moves extremities independently NEURO: No focal neurological deficits PSYCH: Depressed mood, flat affect Integumentary: No rashes/lesions/wounds noted on exposed skin surfaces    Data Reviewed: I have personally reviewed following labs and imaging  studies  CBC: Recent Labs  Lab 10/16/23 0503 10/17/23 0524 10/18/23 0459 10/19/23 0505  WBC 10.7* 11.3* 8.6 8.0  HGB 15.0 15.9* 16.0* 16.1*  HCT 46.2* 47.7* 49.4* 48.8*  MCV 94.5 93.9 95.0 94.0  PLT 272 270 253 257   Basic Metabolic Panel: Recent Labs  Lab 10/16/23 0503 10/17/23 0902 10/18/23 0459 10/19/23 0505 10/20/23 0453  NA 137 134* 132* 132* 131*  K 4.0 3.3* 3.7 3.8 4.0  CL 96* 94* 95* 94* 94*  CO2 27 27 25 26 30   GLUCOSE 127* 100* 121* 135* 136*  BUN 23 26* 23 30* 33*  CREATININE 0.62 0.56 0.48 0.48 0.50  CALCIUM 7.8* 7.8* 7.6* 7.8* 8.8*  MG 2.6*  --  2.7*  --   --    GFR: Estimated Creatinine Clearance: 45.8 mL/min (by C-G formula based on SCr of 0.5 mg/dL). Liver Function Tests: Recent Labs  Lab 10/19/23 0505  AST 17  ALT 18  ALKPHOS 69  BILITOT 0.7  PROT 7.1  ALBUMIN 2.9*   No results for input(s): "LIPASE", "AMYLASE" in the last 168 hours. No results for input(s): "AMMONIA" in the last 168 hours. Coagulation Profile: No results for input(s): "INR", "PROTIME" in the last 168 hours. Cardiac Enzymes: No results for input(s): "CKTOTAL", "CKMB", "CKMBINDEX", "TROPONINI" in the last 168 hours. BNP (last 3 results) No results  for input(s): "PROBNP" in the last 8760 hours. HbA1C: No results for input(s): "HGBA1C" in the last 72 hours. CBG: Recent Labs  Lab 10/19/23 1945  GLUCAP 126*   Lipid Profile: No results for input(s): "CHOL", "HDL", "LDLCALC", "TRIG", "CHOLHDL", "LDLDIRECT" in the last 72 hours. Thyroid Function Tests: No results for input(s): "TSH", "T4TOTAL", "FREET4", "T3FREE", "THYROIDAB" in the last 72 hours. Anemia Panel: No results for input(s): "VITAMINB12", "FOLATE", "FERRITIN", "TIBC", "IRON", "RETICCTPCT" in the last 72 hours. Sepsis Labs: Recent Labs  Lab 10/17/23 0902  PROCALCITON <0.10    Recent Results (from the past 240 hours)  Resp panel by RT-PCR (RSV, Flu A&B, Covid) Anterior Nasal Swab     Status: None    Collection Time: 10/12/23  2:53 PM   Specimen: Anterior Nasal Swab  Result Value Ref Range Status   SARS Coronavirus 2 by RT PCR NEGATIVE NEGATIVE Final    Comment: (NOTE) SARS-CoV-2 target nucleic acids are NOT DETECTED.  The SARS-CoV-2 RNA is generally detectable in upper respiratory specimens during the acute phase of infection. The lowest concentration of SARS-CoV-2 viral copies this assay can detect is 138 copies/mL. A negative result does not preclude SARS-Cov-2 infection and should not be used as the sole basis for treatment or other patient management decisions. A negative result may occur with  improper specimen collection/handling, submission of specimen other than nasopharyngeal swab, presence of viral mutation(s) within the areas targeted by this assay, and inadequate number of viral copies(<138 copies/mL). A negative result must be combined with clinical observations, patient history, and epidemiological information. The expected result is Negative.  Fact Sheet for Patients:  BloggerCourse.com  Fact Sheet for Healthcare Providers:  SeriousBroker.it  This test is no t yet approved or cleared by the [place] FDA and  has been authorized for detection and/or diagnosis of SARS-CoV-2 by FDA under an Emergency Use Authorization (EUA). This EUA will remain  in effect (meaning this test can be used) for the duration of the COVID-19 declaration under Section 564(b)(1) of the Act, 21 U.S.C.section 360bbb-3(b)(1), unless the authorization is terminated  or revoked sooner.       Influenza A by PCR NEGATIVE NEGATIVE Final   Influenza B by PCR NEGATIVE NEGATIVE Final    Comment: (NOTE) The Xpert Xpress SARS-CoV-2/FLU/RSV plus assay is intended as an aid in the diagnosis of influenza from Nasopharyngeal swab specimens and should not be used as a sole basis for treatment. Nasal washings and aspirates are unacceptable for  Xpert Xpress SARS-CoV-2/FLU/RSV testing.  Fact Sheet for Patients: BloggerCourse.com  Fact Sheet for Healthcare Providers: SeriousBroker.it  This test is not yet approved or cleared by the [place] FDA and has been authorized for detection and/or diagnosis of SARS-CoV-2 by FDA under an Emergency Use Authorization (EUA). This EUA will remain in effect (meaning this test can be used) for the duration of the COVID-19 declaration under Section 564(b)(1) of the Act, 21 U.S.C. section 360bbb-3(b)(1), unless the authorization is terminated or revoked.     Resp Syncytial Virus by PCR NEGATIVE NEGATIVE Final    Comment: (NOTE) Fact Sheet for Patients: BloggerCourse.com  Fact Sheet for Healthcare Providers: SeriousBroker.it  This test is not yet approved or cleared by the [place] FDA and has been authorized for detection and/or diagnosis of SARS-CoV-2 by FDA under an Emergency Use Authorization (EUA). This EUA will remain in effect (meaning this test can be used) for the duration of the COVID-19 declaration under Section 564(b)(1) of the Act, 21 U.S.C.  section 360bbb-3(b)(1), unless the authorization is terminated or revoked.  Performed at [facility], [address] [place], [place] [address]   Respiratory (~20 pathogens) panel by PCR     Status: None   Collection Time: 10/12/23  2:53 PM   Specimen: Nasopharyngeal Swab; Respiratory  Result Value Ref Range Status   Adenovirus NOT DETECTED NOT DETECTED Final   Coronavirus 229E NOT DETECTED NOT DETECTED Final    Comment: (NOTE) The Coronavirus on the Respiratory Panel, DOES NOT test for the novel  Coronavirus (2019 nCoV)    Coronavirus HKU1 NOT DETECTED NOT DETECTED Final   Coronavirus NL63 NOT DETECTED NOT DETECTED Final   Coronavirus OC43 NOT DETECTED NOT DETECTED Final   Metapneumovirus NOT  DETECTED NOT DETECTED Final   Rhinovirus / Enterovirus NOT DETECTED NOT DETECTED Final   Influenza A NOT DETECTED NOT DETECTED Final   Influenza B NOT DETECTED NOT DETECTED Final   Parainfluenza Virus 1 NOT DETECTED NOT DETECTED Final   Parainfluenza Virus 2 NOT DETECTED NOT DETECTED Final   Parainfluenza Virus 3 NOT DETECTED NOT DETECTED Final   Parainfluenza Virus 4 NOT DETECTED NOT DETECTED Final   Respiratory Syncytial Virus NOT DETECTED NOT DETECTED Final   Bordetella pertussis NOT DETECTED NOT DETECTED Final   Bordetella Parapertussis NOT DETECTED NOT DETECTED Final   Chlamydophila pneumoniae NOT DETECTED NOT DETECTED Final   Mycoplasma pneumoniae NOT DETECTED NOT DETECTED Final    Comment: Performed at [facility], [address], [place], [place] [address]  MRSA Next Gen by PCR, Nasal     Status: Abnormal   Collection Time: 10/15/23 12:06 AM   Specimen: Nasal Mucosa; Nasal Swab  Result Value Ref Range Status   MRSA by PCR Next Gen DETECTED (A) NOT DETECTED Final    Comment: (NOTE) The GeneXpert MRSA Assay (FDA approved for NASAL specimens only), is one component of a comprehensive MRSA colonization surveillance program. It is not intended to diagnose MRSA infection nor to guide or monitor treatment for MRSA infections. Test performance is not FDA approved in patients less than 61 years old. Performed at [facility], [address] [place], [place] [address]          Radiology Studies: No results found.        Scheduled Meds:  busPIRone  5 mg Oral TID   donepezil  5 mg Oral Daily   enoxaparin (LOVENOX) injection  40 mg Subcutaneous Q24H   escitalopram  10 mg Oral Daily   Fluticasone-Umeclidin-Vilant  1 puff Inhalation Daily   folic acid  1 mg Oral Daily   lactose free nutrition  237 mL Oral TID BM   memantine  10 mg Oral BID   montelukast  10 mg Oral q morning   multivitamin with minerals  1 tablet Oral Daily   pravastatin  10  mg Oral Daily   [START ON 10/23/2023] predniSONE  30 mg Oral Q breakfast   Followed by   Melene Muller ON 10/25/2023] predniSONE  20 mg Oral Q breakfast   Followed by   Melene Muller ON 10/27/2023] predniSONE  10 mg Oral Q breakfast   traZODone  100 mg Oral QHS   Continuous Infusions:     LOS: 7 days    Time spent: 46 minutes spent on chart review, discussion with nursing staff, consultants, updating family and interview/physical exam; more than 50% of that time was spent in counseling and/or coordination of care.    Alvira Philips [place],  DO [facility] Available via Epic secure chat 7am-7pm After these hours, please refer to coverage provider listed on [email or web address] 10/22/2023, 10:38 AM

## 2023-10-22 NOTE — Progress Notes (Signed)
 Physical Therapy Treatment Patient Details Name: Kathy Skinner MRN: [id number] DOB: 07-23-1946 Today's Date: 10/22/2023   History of Present Illness 78 yo female admitted with acute on chronic respiratory failure. PMH: anxiety, panic attacks, osteoporosis,COPD, chronic respiratory failure, dementia, vertigo    PT Comments  Pt quite acerbic but with ++encouragement up to ambulate limited distance in room.  Assist of 2 largely for balance and safety with pt frequently repeating "I'm gonna sit".  Pt appears to have sufficient strength to progress to PLOF but very limited drive at this point and requires determined encouragement for best performance.   If plan is discharge home, recommend the following: A lot of help with walking and/or transfers;Direct supervision/assist for medications management;Assist for transportation;Direct supervision/assist for financial management;A lot of help with bathing/dressing/bathroom;Assistance with cooking/housework;Supervision due to cognitive status;Help with stairs or ramp for entrance   Can travel by private vehicle     No  Equipment Recommendations  None recommended by PT    Recommendations for Other Services       Precautions / Restrictions Precautions Precautions: Fall Precaution/Restrictions Comments: monitor O2 Restrictions Weight Bearing Restrictions Per Provider Order: No     Mobility  Bed Mobility Overal bed mobility: Needs Assistance Bed Mobility: Supine to Sit, Sit to Supine     Supine to sit: Min assist, +2 for physical assistance, +2 for safety/equipment Sit to supine: Min assist   General bed mobility comments: pt resistive intially but with encouragement agreeable    Transfers Overall transfer level: Needs assistance Equipment used: 2 person hand held assist Transfers: Sit to/from Stand Sit to Stand: Min assist, +2 physical assistance, +2 safety/equipment           General transfer comment: Sit<>stand x 3 with Assist  to bring wt up and fwd and to balance in initial standing    Ambulation/Gait Ambulation/Gait assistance: Min assist, +2 physical assistance, +2 safety/equipment Gait Distance (Feet): 18 Feet Assistive device: 2 person hand held assist Gait Pattern/deviations: Step-to pattern, Step-through pattern, Decreased step length - right, Decreased step length - left, Shuffle, Trunk flexed, Narrow base of support Gait velocity: decr     General Gait Details: Constant encouragement with assist for balance/stability; Pt limiting distance repeating "I'm gonna sit"   Stairs             Wheelchair Mobility     Tilt Bed    Modified Rankin (Stroke Patients Only)       Balance Overall balance assessment: Needs assistance Sitting-balance support: Feet supported, No upper extremity supported Sitting balance-Leahy Scale: Fair     Standing balance support: Bilateral upper extremity supported, Single extremity supported Standing balance-Leahy Scale: Poor                              Communication Communication Communication: No apparent difficulties  Cognition Arousal: Alert Behavior During Therapy: Flat affect   PT - Cognitive impairments: History of cognitive impairments                       PT - Cognition Comments: Pt requiring ++encouragement to participate Following commands: Impaired Following commands impaired: Follows one step commands with increased time    Cueing Cueing Techniques: Verbal cues, Gestural cues, Tactile cues  Exercises      General Comments        Pertinent Vitals/Pain Pain Assessment Pain Assessment: No/denies pain    Home Living  Prior Function            PT Goals (current goals can now be found in the care plan section) Acute Rehab PT Goals Patient Stated Goal: Get warm PT Goal Formulation: Patient unable to participate in goal setting Time For Goal Achievement: 10/28/23 Potential  to Achieve Goals: Fair Progress towards PT goals: Progressing toward goals    Frequency    Min 1X/week      PT Plan      Co-evaluation              AM-PAC PT "6 Clicks" Mobility   Outcome Measure  Help needed turning from your back to your side while in a flat bed without using bedrails?: A Little Help needed moving from lying on your back to sitting on the side of a flat bed without using bedrails?: A Little Help needed moving to and from a bed to a chair (including a wheelchair)?: A Lot Help needed standing up from a chair using your arms (e.g., wheelchair or bedside chair)?: A Little Help needed to walk in hospital room?: A Lot Help needed climbing 3-5 steps with a railing? : A Lot 6 Click Score: 15    End of Session Equipment Utilized During Treatment: Oxygen;Gait belt Activity Tolerance: Patient limited by fatigue;Other (comment) (Self limiting) Patient left: in bed;with call bell/phone within reach;with bed alarm set Nurse Communication: Mobility status PT Visit Diagnosis: Muscle weakness (generalized) (M62.81);Difficulty in walking, not elsewhere classified (R26.2);History of falling (Z91.81)     Time: 1191-4782 PT Time Calculation (min) (ACUTE ONLY): 17 min  Charges:    $Gait Training: 8-22 mins PT General Charges $$ ACUTE PT VISIT: 1 Visit                     Mauro Kaufmann PT Acute Rehabilitation Services Pager [phone] Office [phone]    [facility] 10/22/2023, 4:51 PM

## 2023-10-22 NOTE — TOC Progression Note (Signed)
 Transition of Care [facility]) - Progression Note    Patient Details  Name: Vieva Brummitt MRN: [id number] Date of Birth: 06/18/1946  [facility] CM/SW Contact  Amada Jupiter, LCSW Phone Number: 10/22/2023, 4:28 PM  Clinical Narrative:     Spoke with pt's daughter, Lynnell Dike, this afternoon about proceeding with SNF bed search. She reports that she has been talking with [facility] and they are willing to try and bring pt back to their memory care unit.  Confirmed with Grover Canavan, the resident care director, that they are considering pt's return but requesting PT update and info on mobility.  PT has been able to work with pt this afternoon and note will be faxed to facility.  Grover Canavan plans to try and do an on site visit with pt tomorrow and will then follow up with daughter and TOC on decision for pt's return.     Expected Discharge Plan: Memory Care Barriers to Discharge: Continued Medical Work up  Expected Discharge Plan and Services   Discharge Planning Services: CM Consult Post Acute Care Choice:  (memory care/HHPT)                                         Social Determinants of Health (SDOH) Interventions SDOH Screenings   Food Insecurity: No Food Insecurity (10/13/2023)  Housing: Low Risk  (10/13/2023)  Transportation Needs: No Transportation Needs (10/13/2023)  Utilities: Not At Risk (10/13/2023)  Social Connections: Patient Unable To Answer (10/13/2023)  Tobacco Use: Medium Risk (10/12/2023)    Readmission Risk Interventions     No data to display

## 2023-10-23 DIAGNOSIS — J9621 Acute and chronic respiratory failure with hypoxia: Secondary | ICD-10-CM | POA: Diagnosis not present

## 2023-10-23 LAB — BASIC METABOLIC PANEL
Anion gap: 10 (ref 5–15)
BUN: 21 mg/dL (ref 8–23)
CO2: 28 mmol/L (ref 22–32)
Calcium: 8.7 mg/dL — ABNORMAL LOW (ref 8.9–10.3)
Chloride: 95 mmol/L — ABNORMAL LOW (ref 98–111)
Creatinine, Ser: 0.43 mg/dL — ABNORMAL LOW (ref 0.44–1.00)
GFR, Estimated: >60 mL/min (ref >60)
Glucose, Bld: 82 mg/dL (ref 70–99)
Potassium: 4.4 mmol/L (ref 3.5–5.1)
Sodium: 133 mmol/L — ABNORMAL LOW (ref 135–145)

## 2023-10-23 LAB — CBC
HCT: 48.3 % — ABNORMAL HIGH (ref 36.0–46.0)
Hemoglobin: 15.6 g/dL — ABNORMAL HIGH (ref 12.0–15.0)
MCH: 31.3 pg (ref 26.0–34.0)
MCHC: 32.3 g/dL (ref 30.0–36.0)
MCV: 96.8 fL (ref 80.0–100.0)
Platelets: 202 10*3/uL (ref 150–400)
RBC: 4.99 MIL/uL (ref 3.87–5.11)
RDW: 12.9 % (ref 11.5–15.5)
WBC: 6.5 10*3/uL (ref 4.0–10.5)
nRBC: 0 % (ref 0.0–0.2)

## 2023-10-23 NOTE — Progress Notes (Signed)
 Occupational Therapy Treatment Patient Details Name: Kathy Skinner MRN: [id number] DOB: [birth year] Today's Date: 10/23/2023   History of present illness 78 yo female admitted with acute on chronic respiratory failure. PMH: anxiety, panic attacks, osteoporosis,COPD, chronic respiratory failure, dementia, vertigo   OT comments  Upon arrival pt in bed and repeating, "wet" and "cold", referring to her gown and bed sheet. Pt initially resistive to sitting EOB and allowing therapist to assist her with changing her gown and bed sheet, but eventually cooperated with these tasks.pt adamantly refused sit - stand/OOB activity. OT will continue to follow acutely to maximize level of function and safety       If plan is discharge home, recommend the following:  A lot of help with walking and/or transfers;A lot of help with bathing/dressing/bathroom;Assistance with feeding;Assistance with cooking/housework;Direct supervision/assist for medications management;Direct supervision/assist for financial management;Assist for transportation;Supervision due to cognitive status   Equipment Recommendations  Other (comment) (TBD at next level of care)    Recommendations for Other Services      Precautions / Restrictions Precautions Precautions: Fall Precaution/Restrictions Comments: monitor O2 Restrictions Weight Bearing Restrictions Per Provider Order: No       Mobility Bed Mobility Overal bed mobility: Needs Assistance Bed Mobility: Supine to Sit, Sit to Supine     Supine to sit: Min assist, +2 for physical assistance, +2 for safety/equipment Sit to supine: Min assist   General bed mobility comments: pt resistive intially but with encouragement agreeable to sit EOB    Transfers                   General transfer comment: pt adamantly refused sit - stand/OOB activity     Balance Overall balance assessment: Needs assistance Sitting-balance support: Feet supported, No upper extremity  supported Sitting balance-Leahy Scale: Fair     Standing balance support: Bilateral upper extremity supported, Single extremity supported Standing balance-Leahy Scale: Poor                             ADL either performed or assessed with clinical judgement   ADL Overall ADL's : Needs assistance/impaired     Grooming: Wash/dry hands;Wash/dry face;Contact guard assist;Sitting           Upper Body Dressing : Minimal assistance;Sitting                     General ADL Comments: pt refused OOB activity    Extremity/Trunk Assessment Upper Extremity Assessment Upper Extremity Assessment: Generalized weakness   Lower Extremity Assessment Lower Extremity Assessment: Defer to PT evaluation   Cervical / Trunk Assessment Cervical / Trunk Assessment: Normal    Vision Ability to See in Adequate Light: 0 Adequate Patient Visual Report: No change from baseline     Perception     Praxis     Communication Communication Communication: No apparent difficulties   Cognition   Behavior During Therapy: Flat affect, Agitated Cognition: History of cognitive impairments, No family/caregiver present to determine baseline             OT - Cognition Comments: pt repeating, "wet" and "cold", referring to her gown and bed sheet. Pt initially resistive to sitting EOB and allowing therapist to assist her with changing her gown and bed sheet, but eventually cooperated with these tasks                 Following commands: Impaired Following commands impaired: Follows one step  commands with increased time      Cueing   Cueing Techniques: Verbal cues, Gestural cues, Tactile cues  Exercises      Shoulder Instructions       General Comments      Pertinent Vitals/ Pain       Pain Assessment Pain Assessment: No/denies pain Faces Pain Scale: No hurt  Home Living                                          Prior Functioning/Environment               Frequency  Min 1X/week        Progress Toward Goals  OT Goals(current goals can now be found in the care plan section)  Progress towards OT goals: OT to reassess next treatment     Plan      Co-evaluation                 AM-PAC OT "6 Clicks" Daily Activity     Outcome Measure   Help from another person eating meals?: A Little Help from another person taking care of personal grooming?: A Little Help from another person toileting, which includes using toliet, bedpan, or urinal?: A Lot Help from another person bathing (including washing, rinsing, drying)?: A Lot Help from another person to put on and taking off regular upper body clothing?: A Little Help from another person to put on and taking off regular lower body clothing?: A Lot 6 Click Score: 15    End of Session    OT Visit Diagnosis: Other abnormalities of gait and mobility (R26.89);Repeated falls (R29.6);Muscle weakness (generalized) (M62.81)   Activity Tolerance Treatment limited secondary to agitation   Patient Left in bed;with call bell/phone within reach;with bed alarm set   Nurse Communication          Time: 4696-2952 OT Time Calculation (min): 16 min  Charges: OT General Charges $OT Visit: 1 Visit OT Treatments $Therapeutic Activity: 8-22 mins   Galen Manila 10/23/2023, 12:33 PM

## 2023-10-23 NOTE — Plan of Care (Signed)
 ?  Problem: Clinical Measurements: ?Goal: Ability to maintain clinical measurements within normal limits will improve ?Outcome: Progressing ?Goal: Will remain free from infection ?Outcome: Progressing ?Goal: Diagnostic test results will improve ?Outcome: Progressing ?  ?

## 2023-10-23 NOTE — Progress Notes (Signed)
 PROGRESS NOTE    Aamina Skiff  BMW:[id number] DOB: [birth year] DOA: 10/12/2023 PCP: Laurell Josephs, MD (Inactive)    Brief Narrative:   Kathy Skinner is a 78 y.o. female with past medical history significant for dementia, asthma/COPD, anxiety/panic attacks, osteoporosis who presents to [facility] ED on 2/10 from [facility] memory care unit with weakness, cough and was noted to desaturate to 80% on room air.  Apparently there has been a flu outbreak in the facility.  Patient denied headache, no chest pain, no abdominal pain, no nausea/vomiting/diarrhea.  EMS was activated and patient was brought to the ED for further evaluation and management.  In the ED, temperature 98.7 F, HR 80, RR 18, BP 129/73, SpO2 98% room air.  WBC 6.3, hemoglobin 15.2, platelet count 237.  Sodium 143, potassium 3.2, chloride 102, CO2 29, glucose 112, BUN 13, creatinine 0.47.  AST 17, ALT 14, total bilirubin 1.2.  BNP 129.1.  COVID/influenza/RSV PCR negative.  Respiratory viral panel negative.  Chest x-ray with no acute cardiopulmonary disease process.  Patient received DuoNeb, Solu-Medrol 125 mg IV x 1.  TRH consulted for admission for further evaluation and management of acute hypoxic respite failure secondary to COPD exacerbation.  10/23/2023: Patient seen.  No new changes.  Patient is awaiting disposition.  Discussed with patient's nurse.  Assessment & Plan:   Acute hypoxic respiratory failure COPD exacerbation Patient presenting to ED after being found hypoxic at facility with SpO2 80% on room air and placed on supplemental oxygen.  Also has been having generalized weakness over the last several days with known influenza outbreak at the facility; although patient negative for influenza A/B, negative for COVID/RSV and respiratory viral panel were negative.  Chest x-ray with no acute cardiopulmonary disease process. -- Trelegy Ellipta 1 puff daily -- Prednisone taper -- Singulair 10 mg p.o. daily --  DuoNebs scheduled every 6 hours -- Continue supplemental oxygen, maintain SpO2 > 88%; currently on 2 L [place] with SpO2 96% at rest 10/23/2023: Continue current regimen.  Patient is stable.  Community-acquired pneumonia CT angiogram chest with no evidence of PE or dissection, noted bilateral lower lobe consolidation with nodular interstitial process consistent with infection versus inflammatory process.  Completed 5-day course of azithromycin and ceftriaxone. -- CBC in the am  Acute on chronic diastolic congestive heart failure BNP elevated on admission 129.1.  TTE with LVEF 65 to 70%, grade 1 diastolic dysfunction, LA mildly dilated. -- Strict I's and O's and daily weights 10/23/2023: Currently compensated.  Concern for intermittent urinary retention RN reports intermittent urinary retention, patient has been urinating independently. -- Continue to monitor urine output closely, bladder scan PRN  Hypokalemia Resolved. Potassium is 4.4 today.  Anxiety/panic attacks -- Lexapro 10 mg p.o. daily -- BuSpar 5 mg p.o. 3 times daily  Hyperlipidemia -- Pravastatin 10 mg p.o. daily  Dementia -- Delirium precautions -- Get up during the day -- Encourage a familiar face to remain present throughout the day -- Keep blinds open and lights on during daylight hours -- Minimize the use of opioids/benzodiazepines -- Namenda 10 mg p.o. twice daily -- Donepezil 5 mg p.o. daily  Weakness/debility/deconditioning: -- PT/OT evaluation: Recommend home health on return to memory care unit  Adult failure to thrive Patient with poor oral intake, not eating much per RN staff but does drink fluids. -- If appetite does not improve may need to consider further goals of care discussions  Ethics: Discussed with patient's daughter, Susie via telephone on 10/14/2023 regarding  CODE STATUS.  Patient's wishes are to be DNR without CPR, intubation or significant resuscitation as this would be "inhumane" given her  advanced age, dementia and osteoporotic status.  CODE STATUS changed to DNR.   DVT prophylaxis: enoxaparin (LOVENOX) injection 40 mg Start: 10/12/23 2200    Code Status: Limited: Do not attempt resuscitation (DNR) -DNR-LIMITED -Do Not Intubate/DNI  Family Communication: No family present at bedside this morning  Disposition Plan:  Level of care: Med-Surg Status is: Inpatient Remains inpatient appropriate because: [facility] consulted for SNF placement, from memory care facility  Consultants:  None  Procedures:  TTE  Antimicrobials:  Azithromycin 2/15 - 2/19 Ceftriaxone 2/15 2/19   Subjective: No history from patient.   Objective: Vitals:   10/22/23 1335 10/22/23 2116 10/23/23 0624 10/23/23 1347  BP:  103/65 104/88 111/67  Pulse:  65 (!) 52 (!) 58  Resp:  17 18 16   Temp:  97.9 F (36.6 C) 97.9 F (36.6 C) 97.8 F (36.6 C)  TempSrc:  Oral  Oral  SpO2: 92% 95% 98% 93%  Weight:      Height:        Intake/Output Summary (Last 24 hours) at 10/23/2023 1724 Last data filed at 10/23/2023 1400 Gross per 24 hour  Intake 580 ml  Output --  Net 580 ml   Filed Weights   10/18/23 0522 10/19/23 0500 10/20/23 0500  Weight: 56 kg 57.7 kg 54.4 kg    Examination:  Physical Exam: General condition: Not in any distress.  Awake and alert. HEENT: No jaundice. Neck: Supple. Lungs: Decreased air entry globally. CVS: S1-S2. Abdomen: Soft and nontender. Extremities: No leg edema.   Data Reviewed: I have personally reviewed following labs and imaging studies  CBC: Recent Labs  Lab 10/17/23 0524 10/18/23 0459 10/19/23 0505 10/23/23 0810  WBC 11.3* 8.6 8.0 6.5  HGB 15.9* 16.0* 16.1* 15.6*  HCT 47.7* 49.4* 48.8* 48.3*  MCV 93.9 95.0 94.0 96.8  PLT 270 253 257 202   Basic Metabolic Panel: Recent Labs  Lab 10/17/23 0902 10/18/23 0459 10/19/23 0505 10/20/23 0453 10/23/23 0810  NA 134* 132* 132* 131* 133*  K 3.3* 3.7 3.8 4.0 4.4  CL 94* 95* 94* 94* 95*  CO2 27 25 26  30 28   GLUCOSE 100* 121* 135* 136* 82  BUN 26* 23 30* 33* 21  CREATININE 0.56 0.48 0.48 0.50 0.43*  CALCIUM 7.8* 7.6* 7.8* 8.8* 8.7*  MG  --  2.7*  --   --   --    GFR: Estimated Creatinine Clearance: 45.8 mL/min (A) (by C-G formula based on SCr of 0.43 mg/dL (L)). Liver Function Tests: Recent Labs  Lab 10/19/23 0505  AST 17  ALT 18  ALKPHOS 69  BILITOT 0.7  PROT 7.1  ALBUMIN 2.9*   No results for input(s): "LIPASE", "AMYLASE" in the last 168 hours. No results for input(s): "AMMONIA" in the last 168 hours. Coagulation Profile: No results for input(s): "INR", "PROTIME" in the last 168 hours. Cardiac Enzymes: No results for input(s): "CKTOTAL", "CKMB", "CKMBINDEX", "TROPONINI" in the last 168 hours. BNP (last 3 results) No results for input(s): "PROBNP" in the last 8760 hours. HbA1C: No results for input(s): "HGBA1C" in the last 72 hours. CBG: Recent Labs  Lab 10/19/23 1945  GLUCAP 126*   Lipid Profile: No results for input(s): "CHOL", "HDL", "LDLCALC", "TRIG", "CHOLHDL", "LDLDIRECT" in the last 72 hours. Thyroid Function Tests: No results for input(s): "TSH", "T4TOTAL", "FREET4", "T3FREE", "THYROIDAB" in the last 72 hours. Anemia  Panel: No results for input(s): "VITAMINB12", "FOLATE", "FERRITIN", "TIBC", "IRON", "RETICCTPCT" in the last 72 hours. Sepsis Labs: Recent Labs  Lab 10/17/23 0902  PROCALCITON <0.10    Recent Results (from the past 240 hours)  MRSA Next Gen by PCR, Nasal     Status: Abnormal   Collection Time: 10/15/23 12:06 AM   Specimen: Nasal Mucosa; Nasal Swab  Result Value Ref Range Status   MRSA by PCR Next Gen DETECTED (A) NOT DETECTED Final    Comment: (NOTE) The GeneXpert MRSA Assay (FDA approved for NASAL specimens only), is one component of a comprehensive MRSA colonization surveillance program. It is not intended to diagnose MRSA infection nor to guide or monitor treatment for MRSA infections. Test performance is not FDA approved in  patients less than 61 years old. Performed at [facility], [address], [place], [place] [address]          Radiology Studies: No results found.        Scheduled Meds:  busPIRone  5 mg Oral TID   donepezil  5 mg Oral Daily   enoxaparin (LOVENOX) injection  40 mg Subcutaneous Q24H   escitalopram  10 mg Oral Daily   Fluticasone-Umeclidin-Vilant  1 puff Inhalation Daily   folic acid  1 mg Oral Daily   lactose free nutrition  237 mL Oral TID BM   memantine  10 mg Oral BID   montelukast  10 mg Oral q morning   multivitamin with minerals  1 tablet Oral Daily   pravastatin  10 mg Oral Daily   predniSONE  30 mg Oral Q breakfast   Followed by   Melene Muller ON 10/25/2023] predniSONE  20 mg Oral Q breakfast   Followed by   Melene Muller ON 10/27/2023] predniSONE  10 mg Oral Q breakfast   traZODone  100 mg Oral QHS   Continuous Infusions:     LOS: 8 days    Time spent: 35 minutes.    Barnetta Chapel, MD.   [facility] Available via Epic secure chat 7am-7pm After these hours, please refer to coverage provider listed on [email or web address] 10/23/2023, 5:24 PM

## 2023-10-24 ENCOUNTER — Other Ambulatory Visit: Payer: Self-pay | Admitting: Emergency Medicine

## 2023-10-24 DIAGNOSIS — J9621 Acute and chronic respiratory failure with hypoxia: Secondary | ICD-10-CM | POA: Diagnosis not present

## 2023-10-24 NOTE — Progress Notes (Signed)
 PROGRESS NOTE    Kathy Skinner  WGN:[id number] DOB: 21-Apr-1946 DOA: 10/12/2023 PCP: Laurell Josephs, MD (Inactive)    Brief Narrative:  Patient is a 78 year old female past medical history significant for dementia, asthma/COPD, anxiety/panic attacks, osteoporosis who presents to [facility] ED on 2/10 from [facility] memory care unit with weakness, cough and was noted to desaturate to 80% on room air.  Apparently there has been a flu outbreak in the facility.  Patient was admitted for further management of acute hypoxic respite failure secondary to COPD exacerbation.  10/24/2023: Patient seen.  No new changes.  Patient is awaiting disposition.  Patient's daughter, Thayer Ohm, updated.  Assessment & Plan:   Acute hypoxic respiratory failure COPD exacerbation Patient presenting to ED after being found hypoxic at facility with SpO2 80% on room air and placed on supplemental oxygen.  Also has been having generalized weakness over the last several days with known influenza outbreak at the facility; although patient negative for influenza A/B, negative for COVID/RSV and respiratory viral panel were negative.  Chest x-ray with no acute cardiopulmonary disease process. -- Trelegy Ellipta 1 puff daily -- Prednisone taper -- Singulair 10 mg p.o. daily -- DuoNebs scheduled every 6 hours -- Continue supplemental oxygen, maintain SpO2 > 88%; currently on 2 L [place] with SpO2 96% at rest 10/24/2023: Continue current regimen.  Patient is stable.  Community-acquired pneumonia CT angiogram chest with no evidence of PE or dissection, noted bilateral lower lobe consolidation with nodular interstitial process consistent with infection versus inflammatory process.  Completed 5-day course of azithromycin and ceftriaxone. -- CBC in the am  Acute on chronic diastolic congestive heart failure BNP elevated on admission 129.1.  TTE with LVEF 65 to 70%, grade 1 diastolic dysfunction, LA mildly  dilated. -- Strict I's and O's and daily weights 10/24/2023: Currently compensated.  Concern for intermittent urinary retention RN reports intermittent urinary retention, patient has been urinating independently. -- Continue to monitor urine output closely, bladder scan PRN  Hypokalemia Resolved. Potassium is 4.4 today.  Anxiety/panic attacks -- Lexapro 10 mg p.o. daily -- BuSpar 5 mg p.o. 3 times daily  Hyperlipidemia -- Pravastatin 10 mg p.o. daily  Dementia -- Delirium precautions -- Get up during the day -- Encourage a familiar face to remain present throughout the day -- Keep blinds open and lights on during daylight hours -- Minimize the use of opioids/benzodiazepines -- Namenda 10 mg p.o. twice daily -- Donepezil 5 mg p.o. daily  Weakness/debility/deconditioning: -- PT/OT evaluation: Recommend home health on return to memory care unit  Adult failure to thrive Patient with poor oral intake, not eating much per RN staff but does drink fluids. -- If appetite does not improve may need to consider further goals of care discussions  Ethics: Discussed with patient's daughter, Susie via telephone on 10/14/2023 regarding CODE STATUS.  Patient's wishes are to be DNR without CPR, intubation or significant resuscitation as this would be "inhumane" given her advanced age, dementia and osteoporotic status.  CODE STATUS changed to DNR.   DVT prophylaxis: enoxaparin (LOVENOX) injection 40 mg Start: 10/12/23 2200    Code Status: Limited: Do not attempt resuscitation (DNR) -DNR-LIMITED -Do Not Intubate/DNI  Family Communication: No family present at bedside this morning  Disposition Plan:  Level of care: Med-Surg Status is: Inpatient Remains inpatient appropriate because: [facility] consulted for SNF placement, from memory care facility  Consultants:  None  Procedures:  TTE  Antimicrobials:  Azithromycin 2/15 - 2/19 Ceftriaxone 2/15 2/19  Subjective: No history from  patient.   Objective: Vitals:   10/23/23 1347 10/23/23 2039 10/24/23 0549 10/24/23 0941  BP: 111/67 100/60 96/60   Pulse: (!) 58 (!) 51 (!) 51   Resp: 16 16 18    Temp: 97.8 F (36.6 C) 98.8 F (37.1 C) 97.9 F (36.6 C)   TempSrc: Oral  Oral   SpO2: 93% 98% 98% 97%  Weight:      Height:        Intake/Output Summary (Last 24 hours) at 10/24/2023 1315 Last data filed at 10/24/2023 1242 Gross per 24 hour  Intake 477 ml  Output --  Net 477 ml   Filed Weights   10/18/23 0522 10/19/23 0500 10/20/23 0500  Weight: 56 kg 57.7 kg 54.4 kg    Examination:  Physical Exam: General condition: Not in any distress.  Awake and alert. HEENT: No jaundice. Neck: Supple. Lungs: Decreased air entry globally. CVS: S1-S2. Abdomen: Soft and nontender. Extremities: No leg edema.   Data Reviewed: I have personally reviewed following labs and imaging studies  CBC: Recent Labs  Lab 10/18/23 0459 10/19/23 0505 10/23/23 0810  WBC 8.6 8.0 6.5  HGB 16.0* 16.1* 15.6*  HCT 49.4* 48.8* 48.3*  MCV 95.0 94.0 96.8  PLT 253 257 202   Basic Metabolic Panel: Recent Labs  Lab 10/18/23 0459 10/19/23 0505 10/20/23 0453 10/23/23 0810  NA 132* 132* 131* 133*  K 3.7 3.8 4.0 4.4  CL 95* 94* 94* 95*  CO2 25 26 30 28   GLUCOSE 121* 135* 136* 82  BUN 23 30* 33* 21  CREATININE 0.48 0.48 0.50 0.43*  CALCIUM 7.6* 7.8* 8.8* 8.7*  MG 2.7*  --   --   --    GFR: Estimated Creatinine Clearance: 45.8 mL/min (A) (by C-G formula based on SCr of 0.43 mg/dL (L)). Liver Function Tests: Recent Labs  Lab 10/19/23 0505  AST 17  ALT 18  ALKPHOS 69  BILITOT 0.7  PROT 7.1  ALBUMIN 2.9*   No results for input(s): "LIPASE", "AMYLASE" in the last 168 hours. No results for input(s): "AMMONIA" in the last 168 hours. Coagulation Profile: No results for input(s): "INR", "PROTIME" in the last 168 hours. Cardiac Enzymes: No results for input(s): "CKTOTAL", "CKMB", "CKMBINDEX", "TROPONINI" in the last 168  hours. BNP (last 3 results) No results for input(s): "PROBNP" in the last 8760 hours. HbA1C: No results for input(s): "HGBA1C" in the last 72 hours. CBG: Recent Labs  Lab 10/19/23 1945  GLUCAP 126*   Lipid Profile: No results for input(s): "CHOL", "HDL", "LDLCALC", "TRIG", "CHOLHDL", "LDLDIRECT" in the last 72 hours. Thyroid Function Tests: No results for input(s): "TSH", "T4TOTAL", "FREET4", "T3FREE", "THYROIDAB" in the last 72 hours. Anemia Panel: No results for input(s): "VITAMINB12", "FOLATE", "FERRITIN", "TIBC", "IRON", "RETICCTPCT" in the last 72 hours. Sepsis Labs: No results for input(s): "PROCALCITON", "LATICACIDVEN" in the last 168 hours.   Recent Results (from the past 240 hours)  MRSA Next Gen by PCR, Nasal     Status: Abnormal   Collection Time: 10/15/23 12:06 AM   Specimen: Nasal Mucosa; Nasal Swab  Result Value Ref Range Status   MRSA by PCR Next Gen DETECTED (A) NOT DETECTED Final    Comment: (NOTE) The GeneXpert MRSA Assay (FDA approved for NASAL specimens only), is one component of a comprehensive MRSA colonization surveillance program. It is not intended to diagnose MRSA infection nor to guide or monitor treatment for MRSA infections. Test performance is not FDA approved in patients less  than 29 years old. Performed at [facility], [address], [place], [place] [address]          Radiology Studies: No results found.        Scheduled Meds:  busPIRone  5 mg Oral TID   donepezil  5 mg Oral Daily   enoxaparin (LOVENOX) injection  40 mg Subcutaneous Q24H   escitalopram  10 mg Oral Daily   Fluticasone-Umeclidin-Vilant  1 puff Inhalation Daily   folic acid  1 mg Oral Daily   lactose free nutrition  237 mL Oral TID BM   memantine  10 mg Oral BID   montelukast  10 mg Oral q morning   multivitamin with minerals  1 tablet Oral Daily   pravastatin  10 mg Oral Daily   [START ON 10/25/2023] predniSONE  20 mg Oral Q breakfast    Followed by   Melene Muller ON 10/27/2023] predniSONE  10 mg Oral Q breakfast   traZODone  100 mg Oral QHS   Continuous Infusions:     LOS: 9 days    Time spent: 35 minutes.    Barnetta Chapel, MD.   [facility] Available via Epic secure chat 7am-7pm After these hours, please refer to coverage provider listed on [email or web address] 10/24/2023, 1:15 PM

## 2023-10-24 NOTE — Plan of Care (Signed)
   Problem: Nutrition: Goal: Adequate nutrition will be maintained Outcome: Progressing

## 2023-10-24 NOTE — Plan of Care (Signed)
  Problem: Clinical Measurements: Goal: Ability to maintain clinical measurements within normal limits will improve Outcome: Progressing Goal: Will remain free from infection Outcome: Progressing   Problem: Coping: Goal: Level of anxiety will decrease Outcome: Progressing   Problem: Elimination: Goal: Will not experience complications related to urinary retention Outcome: Progressing   Problem: Safety: Goal: Ability to remain free from injury will improve Outcome: Progressing   Problem: Skin Integrity: Goal: Risk for impaired skin integrity will decrease Outcome: Progressing

## 2023-10-24 NOTE — Progress Notes (Signed)
 Trelegy home med given- shows on [facility] not MAR.

## 2023-10-25 DIAGNOSIS — J9621 Acute and chronic respiratory failure with hypoxia: Secondary | ICD-10-CM | POA: Diagnosis not present

## 2023-10-25 NOTE — Plan of Care (Signed)
   Problem: Clinical Measurements: Goal: Will remain free from infection Outcome: Progressing Goal: Diagnostic test results will improve Outcome: Progressing

## 2023-10-25 NOTE — Progress Notes (Signed)
 PROGRESS NOTE    Kathy Skinner  ZHY:[id number] DOB: 1946/03/25 DOA: 10/12/2023 PCP: Laurell Josephs, MD (Inactive)    Brief Narrative:  Patient is a 78 year old female past medical history significant for dementia, asthma/COPD, anxiety/panic attacks, osteoporosis who presents to [facility] ED on 2/10 from [facility] memory care unit with weakness, cough and was noted to desaturate to 80% on room air.  Apparently there has been a flu outbreak in the facility.  Patient was admitted for further management of acute hypoxic respite failure secondary to COPD exacerbation.  10/25/2023: Patient seen.  No new changes.  Patient is awaiting disposition.    Assessment & Plan:   Acute hypoxic respiratory failure COPD exacerbation Patient presenting to ED after being found hypoxic at facility with SpO2 80% on room air and placed on supplemental oxygen.  Also has been having generalized weakness over the last several days with known influenza outbreak at the facility; although patient negative for influenza A/B, negative for COVID/RSV and respiratory viral panel were negative.  Chest x-ray with no acute cardiopulmonary disease process. -- Trelegy Ellipta 1 puff daily -- Prednisone taper -- Singulair 10 mg p.o. daily -- DuoNebs scheduled every 6 hours -- Continue supplemental oxygen, maintain SpO2 > 88%; currently on 2 L [place] with SpO2 96% at rest 10/25/2023: Continue current regimen.  Patient is stable.  Community-acquired pneumonia CT angiogram chest with no evidence of PE or dissection, noted bilateral lower lobe consolidation with nodular interstitial process consistent with infection versus inflammatory process.  Completed 5-day course of azithromycin and ceftriaxone.  Acute on chronic diastolic congestive heart failure BNP elevated on admission 129.1.  TTE with LVEF 65 to 70%, grade 1 diastolic dysfunction, LA mildly dilated. -- Strict I's and O's and daily weights 10/25/2023: Currently  compensated.  Concern for intermittent urinary retention RN reports intermittent urinary retention, patient has been urinating independently. -- Continue to monitor urine output closely, bladder scan PRN  Hypokalemia Resolved.  Anxiety/panic attacks -- Lexapro 10 mg p.o. daily -- BuSpar 5 mg p.o. 3 times daily -Stable  Hyperlipidemia -- Pravastatin 10 mg p.o. daily  Dementia -- No behavioral problems -- Namenda 10 mg p.o. twice daily -- Donepezil 5 mg p.o. daily  Weakness/debility/deconditioning: -- PT/OT evaluation:  -Pursue disposition  Adult failure to thrive Patient with poor oral intake, not eating much per RN staff but does drink fluids. -- If appetite does not improve may need to consider further goals of care discussions  Ethics: Discussed with patient's daughter, Susie via telephone on 10/14/2023 regarding CODE STATUS.  Patient's wishes are to be DNR without CPR, intubation or significant resuscitation as this would be "inhumane" given her advanced age, dementia and osteoporotic status.  CODE STATUS changed to DNR.   DVT prophylaxis: enoxaparin (LOVENOX) injection 40 mg Start: 10/12/23 2200    Code Status: Limited: Do not attempt resuscitation (DNR) -DNR-LIMITED -Do Not Intubate/DNI  Family Communication: No family present at bedside this morning  Disposition Plan:  Level of care: Med-Surg Status is: Inpatient Remains inpatient appropriate because: [facility] consulted for SNF placement, from memory care facility  Consultants:  None  Procedures:  TTE  Antimicrobials:  Azithromycin 2/15 - 2/19 Ceftriaxone 2/15 2/19   Subjective: No history from patient.   Objective: Vitals:   10/24/23 2036 10/25/23 0615 10/25/23 0753 10/25/23 1323  BP: (!) 92/58 102/62  102/66  Pulse: 71 78  73  Resp: 18 16  18   Temp: 98.4 F (36.9 C) 98.6 F (37 C)  97.6 F (36.4 C)  TempSrc: Oral Oral  Oral  SpO2: 93% 94% 96% (!) 89%  Weight:      Height:         Intake/Output Summary (Last 24 hours) at 10/25/2023 1617 Last data filed at 10/25/2023 1418 Gross per 24 hour  Intake 830 ml  Output 200 ml  Net 630 ml   Filed Weights   10/18/23 0522 10/19/23 0500 10/20/23 0500  Weight: 56 kg 57.7 kg 54.4 kg    Examination:  Physical Exam: General condition: Not in any distress.  Awake and alert. HEENT: No jaundice. Neck: Supple. Lungs: Decreased air entry globally. CVS: S1-S2. Abdomen: Soft and nontender. Extremities: No leg edema.   Data Reviewed: I have personally reviewed following labs and imaging studies  CBC: Recent Labs  Lab 10/19/23 0505 10/23/23 0810  WBC 8.0 6.5  HGB 16.1* 15.6*  HCT 48.8* 48.3*  MCV 94.0 96.8  PLT 257 202   Basic Metabolic Panel: Recent Labs  Lab 10/19/23 0505 10/20/23 0453 10/23/23 0810  NA 132* 131* 133*  K 3.8 4.0 4.4  CL 94* 94* 95*  CO2 26 30 28   GLUCOSE 135* 136* 82  BUN 30* 33* 21  CREATININE 0.48 0.50 0.43*  CALCIUM 7.8* 8.8* 8.7*   GFR: Estimated Creatinine Clearance: 45.8 mL/min (A) (by C-G formula based on SCr of 0.43 mg/dL (L)). Liver Function Tests: Recent Labs  Lab 10/19/23 0505  AST 17  ALT 18  ALKPHOS 69  BILITOT 0.7  PROT 7.1  ALBUMIN 2.9*   No results for input(s): "LIPASE", "AMYLASE" in the last 168 hours. No results for input(s): "AMMONIA" in the last 168 hours. Coagulation Profile: No results for input(s): "INR", "PROTIME" in the last 168 hours. Cardiac Enzymes: No results for input(s): "CKTOTAL", "CKMB", "CKMBINDEX", "TROPONINI" in the last 168 hours. BNP (last 3 results) No results for input(s): "PROBNP" in the last 8760 hours. HbA1C: No results for input(s): "HGBA1C" in the last 72 hours. CBG: Recent Labs  Lab 10/19/23 1945  GLUCAP 126*   Lipid Profile: No results for input(s): "CHOL", "HDL", "LDLCALC", "TRIG", "CHOLHDL", "LDLDIRECT" in the last 72 hours. Thyroid Function Tests: No results for input(s): "TSH", "T4TOTAL", "FREET4", "T3FREE",  "THYROIDAB" in the last 72 hours. Anemia Panel: No results for input(s): "VITAMINB12", "FOLATE", "FERRITIN", "TIBC", "IRON", "RETICCTPCT" in the last 72 hours. Sepsis Labs: No results for input(s): "PROCALCITON", "LATICACIDVEN" in the last 168 hours.   No results found for this or any previous visit (from the past 240 hours).        Radiology Studies: No results found.        Scheduled Meds:  busPIRone  5 mg Oral TID   donepezil  5 mg Oral Daily   enoxaparin (LOVENOX) injection  40 mg Subcutaneous Q24H   escitalopram  10 mg Oral Daily   Fluticasone-Umeclidin-Vilant  1 puff Inhalation Daily   folic acid  1 mg Oral Daily   lactose free nutrition  237 mL Oral TID BM   memantine  10 mg Oral BID   montelukast  10 mg Oral q morning   multivitamin with minerals  1 tablet Oral Daily   pravastatin  10 mg Oral Daily   predniSONE  20 mg Oral Q breakfast   Followed by   Melene Muller ON 10/27/2023] predniSONE  10 mg Oral Q breakfast   traZODone  100 mg Oral QHS   Continuous Infusions:     LOS: 10 days    Time spent: 35  minutes.    Barnetta Chapel, MD.   [facility] Available via Epic secure chat 7am-7pm After these hours, please refer to coverage provider listed on [email or web address] 10/25/2023, 4:17 PM

## 2023-10-25 NOTE — Progress Notes (Signed)
 Trelegy (PT med from home)- given @ [phone].

## 2023-10-25 NOTE — Plan of Care (Signed)
  Problem: Health Behavior/Discharge Planning: Goal: Ability to manage health-related needs will improve Outcome: Progressing   Problem: Clinical Measurements: Goal: Ability to maintain clinical measurements within normal limits will improve Outcome: Progressing Goal: Will remain free from infection Outcome: Progressing Goal: Respiratory complications will improve Outcome: Progressing   Problem: Activity: Goal: Risk for activity intolerance will decrease Outcome: Progressing   Problem: Nutrition: Goal: Adequate nutrition will be maintained Outcome: Progressing   Problem: Coping: Goal: Level of anxiety will decrease Outcome: Progressing   Problem: Pain Managment: Goal: General experience of comfort will improve and/or be controlled Outcome: Progressing   Problem: Safety: Goal: Ability to remain free from injury will improve Outcome: Progressing   Problem: Skin Integrity: Goal: Risk for impaired skin integrity will decrease Outcome: Progressing

## 2023-10-26 DIAGNOSIS — J9621 Acute and chronic respiratory failure with hypoxia: Secondary | ICD-10-CM | POA: Diagnosis not present

## 2023-10-26 LAB — RENAL FUNCTION PANEL
Albumin: 2.7 g/dL — ABNORMAL LOW (ref 3.5–5.0)
Anion gap: 9 (ref 5–15)
BUN: 29 mg/dL — ABNORMAL HIGH (ref 8–23)
CO2: 29 mmol/L (ref 22–32)
Calcium: 9.2 mg/dL (ref 8.9–10.3)
Chloride: 99 mmol/L (ref 98–111)
Creatinine, Ser: 0.52 mg/dL (ref 0.44–1.00)
GFR, Estimated: >60 mL/min (ref >60)
Glucose, Bld: 108 mg/dL — ABNORMAL HIGH (ref 70–99)
Phosphorus: 3.4 mg/dL (ref 2.5–4.6)
Potassium: 4.1 mmol/L (ref 3.5–5.1)
Sodium: 137 mmol/L (ref 135–145)

## 2023-10-26 LAB — MAGNESIUM: Magnesium: 2.1 mg/dL (ref 1.7–2.4)

## 2023-10-26 LAB — CBC WITH DIFFERENTIAL/PLATELET
Abs Immature Granulocytes: 0.02 10*3/uL (ref 0.00–0.07)
Basophils Absolute: 0 10*3/uL (ref 0.0–0.1)
Basophils Relative: 0 %
Eosinophils Absolute: 0 10*3/uL (ref 0.0–0.5)
Eosinophils Relative: 0 %
HCT: 44.7 % (ref 36.0–46.0)
Hemoglobin: 14.4 g/dL (ref 12.0–15.0)
Immature Granulocytes: 0 %
Lymphocytes Relative: 13 %
Lymphs Abs: 1.1 10*3/uL (ref 0.7–4.0)
MCH: 30.6 pg (ref 26.0–34.0)
MCHC: 32.2 g/dL (ref 30.0–36.0)
MCV: 95.1 fL (ref 80.0–100.0)
Monocytes Absolute: 0.6 10*3/uL (ref 0.1–1.0)
Monocytes Relative: 7 %
Neutro Abs: 6.9 10*3/uL (ref 1.7–7.7)
Neutrophils Relative %: 80 %
Platelets: 208 10*3/uL (ref 150–400)
RBC: 4.7 MIL/uL (ref 3.87–5.11)
RDW: 12.5 % (ref 11.5–15.5)
WBC: 8.6 10*3/uL (ref 4.0–10.5)
nRBC: 0 % (ref 0.0–0.2)

## 2023-10-26 NOTE — Plan of Care (Signed)
   Problem: Clinical Measurements: Goal: Ability to maintain clinical measurements within normal limits will improve Outcome: Progressing Goal: Will remain free from infection Outcome: Progressing

## 2023-10-26 NOTE — TOC Progression Note (Signed)
 Transition of Care [facility]) - Progression Note    Patient Details  Name: Kathy Skinner MRN: [id number] Date of Birth: 18-Sep-1945  Transition of Care [facility]) CM/SW Contact  Amada Jupiter, LCSW Phone Number: 10/26/2023, 4:20 PM  Clinical Narrative:    Have sent current PT notes to [facility] and resident director planning to do on site visit with patient today.  Will follow up in the morning and determine if pt will be able to return.   Expected Discharge Plan: Memory Care Barriers to Discharge: Continued Medical Work up  Expected Discharge Plan and Services   Discharge Planning Services: CM Consult Post Acute Care Choice:  (memory care/HHPT)                                         Social Determinants of Health (SDOH) Interventions SDOH Screenings   Food Insecurity: No Food Insecurity (10/13/2023)  Housing: Low Risk  (10/13/2023)  Transportation Needs: No Transportation Needs (10/13/2023)  Utilities: Not At Risk (10/13/2023)  Social Connections: Patient Unable To Answer (10/13/2023)  Tobacco Use: Medium Risk (10/12/2023)    Readmission Risk Interventions     No data to display

## 2023-10-26 NOTE — Progress Notes (Signed)
 PROGRESS NOTE  Kathy Skinner  ZOX:[id number] DOB: September 25, 1945 DOA: 10/12/2023 PCP: Laurell Josephs, MD (Inactive)  Consultants  Brief Narrative: 78 y.o. female with a PMH significant for dementia, asthma/COPD, anxiety/panic attacks, osteoporosis who presents to [facility] ED on 2/10 from [facility] memory care unit with weakness, cough and was noted to desaturate to 80% on room air. Apparently there has been a flu outbreak in the facility. Patient was admitted for further management of acute hypoxic respite failure secondary to COPD exacerbation.   Assessment & Plan: Acute hypoxic respiratory failure/COPD exacerbation - Patient presenting to ED after being found hypoxic at facility with SpO2 80% on room air and placed on supplemental oxygen.  Also has been having generalized weakness over the last several days with known influenza outbreak at the facility; although patient negative for influenza A/B, negative for COVID/RSV and respiratory viral panel were negative.  Chest x-ray with no acute cardiopulmonary disease process. -- Trelegy Ellipta 1 puff daily -- Prednisone taper, continuing -- Singulair 10 mg p.o. daily -- DuoNebs scheduled every 6 hours -- Continue supplemental oxygen, maintain SpO2 > 88%; currently on 2 L [place] with SpO2 96% at rest - no complaints, felt well today.  Remains on 2L O2.     Community-acquired pneumonia - Completed 5-day course of azithromycin and ceftriaxone. - now tx'ing as COPD exac   Acute on chronic diastolic congestive heart failure BNP elevated on admission 129.1.  TTE with LVEF 65 to 70%, grade 1 diastolic dysfunction, LA mildly dilated. -- Strict I's and O's and daily weights - remains well compensated.  No crackles on exam.    Concern for intermittent urinary retention RN reports intermittent urinary retention, patient has been urinating independently. -- Continue to monitor urine output closely, bladder scan PRN    Hypokalemia Resolved.   Anxiety/panic attacks -- Lexapro 10 mg p.o. daily -- BuSpar 5 mg p.o. 3 times daily -Stable   Hyperlipidemia -- Pravastatin 10 mg p.o. daily   Dementia -- No behavioral problems -- Namenda 10 mg p.o. twice daily -- Donepezil 5 mg p.o. daily   Weakness/debility/deconditioning: -- PT/OT evaluation:  - Awaiting SNF placement    Adult failure to thrive Patient with poor oral intake, not eating much per RN staff but does drink fluids. - If appetite does not improve may need to consider further goals of care discussions   Ethics: Discussed with patient's daughter, Kathy Skinner via telephone on 10/14/2023 regarding CODE STATUS.  Patient's wishes are to be DNR without CPR, intubation or significant resuscitation as this would be "inhumane" given her advanced age, dementia and osteoporotic status.  CODE STATUS changed to DNR.  Nutrition Problem: Severe Malnutrition Etiology: chronic illness (dementia, COPD) Signs/Symptoms: severe fat depletion, severe muscle depletion Interventions: Ensure Enlive (each supplement provides 350kcal and 20 grams of protein), MVI, Liberalize Diet   DVT prophylaxis:  enoxaparin (LOVENOX) injection 40 mg Start: 10/12/23 2200  Code Status:   Code Status: Limited: Do not attempt resuscitation (DNR) -DNR-LIMITED -Do Not Intubate/DNI  Family Communication: spoke with patient's daughter by phone as update.   Level of care: Med-Surg Status is: Inpatient Remains inpatient appropriate because: awaiting SNF placement   Subjective: Patient sleeping on my exam, awakens easily.  No complaints today.  No concerns.  Didn't eat much breakfast, not hungry. No dyspnea/CP  Objective: Vitals:   10/25/23 0753 10/25/23 1323 10/25/23 2225 10/26/23 0556  BP:  102/66 121/61 110/65  Pulse:  73 71 (!) 57  Resp:  18  16 16  Temp:  97.6 F (36.4 C) 98 F (36.7 C) (!) 97.5 F (36.4 C)  TempSrc:  Oral Oral Oral  SpO2: 96% (!) 89% 93% 94%  Weight:       Height:        Intake/Output Summary (Last 24 hours) at 10/26/2023 1215 Last data filed at 10/26/2023 1000 Gross per 24 hour  Intake 730 ml  Output 400 ml  Net 330 ml   Filed Weights   10/18/23 0522 10/19/23 0500 10/20/23 0500  Weight: 56 kg 57.7 kg 54.4 kg   Body mass index is 22.66 kg/m.  Gen: 78 y.o. female in no apparent distress.  Nontoxic.  Thin/cachectic.  Chronically ill-appearing.  Pulm: Non-labored breathing.  Coarse BS throughout CV: Regular rate and rhythm.  GI: Abdomen soft, non-tender.  Thin Ext: Warm, no deformities, +1 pedal edema  Skin: No rashes, lesions  Neuro: Alert and oriented. No focal neurological deficits. Psych: Calm  Judgement and insight appear normal. Mood & affect appropriate.     I have personally reviewed the following labs and images: CBC: Recent Labs  Lab 10/23/23 0810 10/26/23 0441  WBC 6.5 8.6  NEUTROABS  --  6.9  HGB 15.6* 14.4  HCT 48.3* 44.7  MCV 96.8 95.1  PLT 202 208   BMP &GFR Recent Labs  Lab 10/20/23 0453 10/23/23 0810 10/26/23 0441  NA 131* 133* 137  K 4.0 4.4 4.1  CL 94* 95* 99  CO2 30 28 29   GLUCOSE 136* 82 108*  BUN 33* 21 29*  CREATININE 0.50 0.43* 0.52  CALCIUM 8.8* 8.7* 9.2  MG  --   --  2.1  PHOS  --   --  3.4   Estimated Creatinine Clearance: 45.8 mL/min (by C-G formula based on SCr of 0.52 mg/dL). Liver & Pancreas: Recent Labs  Lab 10/26/23 0441  ALBUMIN 2.7*   No results for input(s): "LIPASE", "AMYLASE" in the last 168 hours. No results for input(s): "AMMONIA" in the last 168 hours. Diabetic: No results for input(s): "HGBA1C" in the last 72 hours. Recent Labs  Lab 10/19/23 1945  GLUCAP 126*   Cardiac Enzymes: No results for input(s): "CKTOTAL", "CKMB", "CKMBINDEX", "TROPONINI" in the last 168 hours. No results for input(s): "PROBNP" in the last 8760 hours. Coagulation Profile: No results for input(s): "INR", "PROTIME" in the last 168 hours. Thyroid Function Tests: No results for  input(s): "TSH", "T4TOTAL", "FREET4", "T3FREE", "THYROIDAB" in the last 72 hours. Lipid Profile: No results for input(s): "CHOL", "HDL", "LDLCALC", "TRIG", "CHOLHDL", "LDLDIRECT" in the last 72 hours. Anemia Panel: No results for input(s): "VITAMINB12", "FOLATE", "FERRITIN", "TIBC", "IRON", "RETICCTPCT" in the last 72 hours. Urine analysis:    Component Value Date/Time   COLORURINE YELLOW 01/15/2013 2009   APPEARANCEUR TURBID (A) 01/15/2013 2009   LABSPEC 1.015 01/15/2013 2009   PHURINE 5.5 01/15/2013 2009   GLUCOSEU NEGATIVE 01/15/2013 2009   HGBUR NEGATIVE 01/15/2013 2009   BILIRUBINUR NEGATIVE 01/15/2013 2009   KETONESUR NEGATIVE 01/15/2013 2009   PROTEINUR NEGATIVE 01/15/2013 2009   UROBILINOGEN 0.2 01/15/2013 2009   NITRITE NEGATIVE 01/15/2013 2009   LEUKOCYTESUR SMALL (A) 01/15/2013 2009   Sepsis Labs: Invalid input(s): "PROCALCITONIN", "LACTICIDVEN"  Microbiology: No results found for this or any previous visit (from the past 240 hours).  Radiology Studies: No results found.  Scheduled Meds:  busPIRone  5 mg Oral TID   donepezil  5 mg Oral Daily   enoxaparin (LOVENOX) injection  40 mg Subcutaneous Q24H   escitalopram  10 mg Oral Daily   Fluticasone-Umeclidin-Vilant  1 puff Inhalation Daily   folic acid  1 mg Oral Daily   lactose free nutrition  237 mL Oral TID BM   memantine  10 mg Oral BID   montelukast  10 mg Oral q morning   multivitamin with minerals  1 tablet Oral Daily   pravastatin  10 mg Oral Daily   [START ON 10/27/2023] predniSONE  10 mg Oral Q breakfast   traZODone  100 mg Oral QHS   Continuous Infusions:   LOS: 11 days   35 minutes with more than 50% spent in reviewing records, counseling patient/family and coordinating care.  Tobey Grim, MD [facility] [email or web address] 10/26/2023, 12:15 PM

## 2023-10-26 NOTE — Progress Notes (Signed)
 Physical Therapy Treatment Patient Details Name: Kathy Skinner MRN: [id number] DOB: 17-Aug-1946 Today's Date: 10/26/2023   History of Present Illness 78 yo female admitted with acute on chronic respiratory failure. PMH: anxiety, panic attacks, osteoporosis,COPD, chronic respiratory failure, dementia, vertigo    PT Comments  Pt appears "close" to her baseline.  PT - Cognition Comments: AxO x 1 pleasant.  Engaging.  Cooperative.  Required repeat instruction due to Hx Dementia.  Pleasant.  Asking for chocolate. Assisted OOB to recliner went well.  General bed mobility comments: pt was physically able to transition to EOB with Max encouragement to "get out of bed for lunch" and "chocolate ice cream".  Pt did require increased time to complete scooting due to sunken bed. General transfer comment: pt was able to self rise with hand helt assist and take a few steps from bed to recliner at Supervision/contact Guard Assist.  Pt able to support her weight and properly complete her turns to center herself in recliner.  Good use of hands to steady herself.  Applied purse wich with no issues. General Gait Details: pt was able to self rise and take afew steps from bed to the recliner hand held assist.Positioned in recliner to comfort.  Pt asking for a blanket "I'm cold" and she likes to sit with her legs crossed.  Applied purse wick with no issues.  Assisted with lunch tray set up.  Pt only interested in eating her cookie.     If plan is discharge home, recommend the following: A lot of help with walking and/or transfers;Direct supervision/assist for medications management;Assist for transportation;Direct supervision/assist for financial management;A lot of help with bathing/dressing/bathroom;Assistance with cooking/housework;Supervision due to cognitive status;Help with stairs or ramp for entrance   Can travel by private vehicle     No  Equipment Recommendations  None recommended by PT    Recommendations for  Other Services       Precautions / Restrictions Precautions Precautions: Fall Precaution/Restrictions Comments: Hx Memory Loss Restrictions Weight Bearing Restrictions Per Provider Order: No     Mobility  Bed Mobility Overal bed mobility: Needs Assistance Bed Mobility: Supine to Sit     Supine to sit: Contact guard, Min assist     General bed mobility comments: pt was physically able to transition to EOB with Max encouragement to "get out of bed for lunch" and "chocolate ice cream".  Pt did require increased time to complete scooting due to sunken bed.    Transfers Overall transfer level: Needs assistance Equipment used: None Transfers: Sit to/from Stand Sit to Stand: Supervision, Contact guard assist           General transfer comment: pt was able to self rise with hand helt assist and take a few steps from bed to recliner at Supervision/contact Guard Assist.  Pt able to support her weight and properly complete her turns to center herself in recliner.  Good use of hands to steady herself.  Positioned in recliner to comfort.  Pt asking for a blanket "I'm cold" and she likes to sit with her legs crossed.  Applied purse wich with no issues.    Ambulation/Gait Ambulation/Gait assistance: Supervision, Contact guard assist Gait Distance (Feet): 2 Feet Assistive device: 1 person hand held assist Gait Pattern/deviations: Step-to pattern, Step-through pattern, Decreased step length - right, Decreased step length - left, Shuffle, Trunk flexed, Narrow base of support       General Gait Details: pt was able to self rise and take afew steps from bed  to the recliner hand held assist.   Stairs             Wheelchair Mobility     Tilt Bed    Modified Rankin (Stroke Patients Only)       Balance                                            Communication    Cognition Arousal: Alert Behavior During Therapy: WFL for tasks assessed/performed   PT -  Cognitive impairments: History of cognitive impairments                       PT - Cognition Comments: AxO x 1 pleasant.  Engaging.  Cooperative.  Required repeat instruction due to Hx Dementia.  Pleasant.  Asking for chocolate.        Cueing    Exercises      General Comments        Pertinent Vitals/Pain Pain Assessment Pain Assessment: No/denies pain    Home Living                          Prior Function            PT Goals (current goals can now be found in the care plan section) Progress towards PT goals: Progressing toward goals    Frequency    Min 1X/week      PT Plan      Co-evaluation              AM-PAC PT "6 Clicks" Mobility   Outcome Measure  Help needed turning from your back to your side while in a flat bed without using bedrails?: None Help needed moving from lying on your back to sitting on the side of a flat bed without using bedrails?: None Help needed moving to and from a bed to a chair (including a wheelchair)?: None Help needed standing up from a chair using your arms (e.g., wheelchair or bedside chair)?: None Help needed to walk in hospital room?: A Little Help needed climbing 3-5 steps with a railing? : A Little 6 Click Score: 22    End of Session Equipment Utilized During Treatment: Gait belt;Oxygen Activity Tolerance: Patient tolerated treatment well Patient left: in chair;with call bell/phone within reach;with chair alarm set Nurse Communication: Mobility status PT Visit Diagnosis: Muscle weakness (generalized) (M62.81);Difficulty in walking, not elsewhere classified (R26.2);History of falling (Z91.81)     Time: 4098-1191 PT Time Calculation (min) (ACUTE ONLY): 17 min  Charges:    $Therapeutic Activity: 8-22 mins PT General Charges $$ ACUTE PT VISIT: 1 Visit                     Felecia Shelling  PTA Acute  Rehabilitation Services Office M-F          [phone]

## 2023-10-27 DIAGNOSIS — J9621 Acute and chronic respiratory failure with hypoxia: Secondary | ICD-10-CM | POA: Diagnosis not present

## 2023-10-27 NOTE — Plan of Care (Signed)
   Problem: Health Behavior/Discharge Planning: Goal: Ability to manage health-related needs will improve Outcome: Progressing   Problem: Clinical Measurements: Goal: Will remain free from infection Outcome: Progressing

## 2023-10-27 NOTE — Progress Notes (Signed)
 PROGRESS NOTE  Kathy Skinner  ZOX:[id number] DOB: [birth year] DOA: 10/12/2023 PCP: Laurell Josephs, MD (Inactive)  Consultants  Brief Narrative: 79 y.o. female with a PMH significant for dementia, asthma/COPD, anxiety/panic attacks, osteoporosis who presents to [facility] ED on 2/10 from [facility] memory care unit with weakness, cough and was noted to desaturate to 80% on room air. Apparently there has been a flu outbreak in the facility. Patient was admitted for further management of acute hypoxic respite failure secondary to COPD exacerbation.   Assessment & Plan: Acute hypoxic respiratory failure/COPD exacerbation - Patient presenting to ED after being found hypoxic at facility with SpO2 80% on room air and placed on supplemental oxygen.  Also has been having generalized weakness over the last several days with known influenza outbreak at the facility; although patient negative for influenza A/B, negative for COVID/RSV and respiratory viral panel were negative.  Chest x-ray with no acute cardiopulmonary disease process. -- Trelegy Ellipta 1 puff daily -- Prednisone taper, continuing -- Singulair 10 mg p.o. daily -- DuoNebs scheduled every 6 hours -- Continue supplemental oxygen, maintain SpO2 > 88%; currently on 2 L [place] with SpO2 96% at rest - no complaints, felt well today.  Remains on 2L O2.     Community-acquired pneumonia - Completed 5-day course of azithromycin and ceftriaxone. - now tx'ing as COPD exac   Acute on chronic diastolic congestive heart failure BNP elevated on admission 129.1.  TTE with LVEF 65 to 70%, grade 1 diastolic dysfunction, LA mildly dilated. -- Strict I's and O's and daily weights - remains well compensated.  No crackles on exam.  Faint trace edema BL ext, seems to be baseline for her.    Concern for intermittent urinary retention RN reports intermittent urinary retention, patient has been urinating independently. -- Continue to monitor urine  output closely, bladder scan PRN   Hypokalemia Resolved.   Anxiety/panic attacks -- Lexapro 10 mg p.o. daily -- BuSpar 5 mg p.o. 3 times daily -Stable   Hyperlipidemia -- Pravastatin 10 mg p.o. daily   Dementia -- No behavioral problems -- Namenda 10 mg p.o. twice daily -- Donepezil 5 mg p.o. daily - Fairly demented/confused at baseline.  Oriented to person.   Weakness/debility/deconditioning: -- PT/OT evaluation:  - Awaiting SNF placement    Adult failure to thrive Patient with poor oral intake, not eating much per RN staff but does drink fluids. - If appetite does not improve may need to consider further goals of care discussions   Ethics: Discussed with patient's daughter, Susie via telephone on 10/14/2023 regarding CODE STATUS.  Patient's wishes are to be DNR without CPR, intubation or significant resuscitation as this would be "inhumane" given her advanced age, dementia and osteoporotic status.  CODE STATUS changed to DNR.  Nutrition Problem: Severe Malnutrition Etiology: chronic illness (dementia, COPD) Signs/Symptoms: severe fat depletion, severe muscle depletion Interventions: Ensure Enlive (each supplement provides 350kcal and 20 grams of protein), MVI, Liberalize Diet   DVT prophylaxis:  enoxaparin (LOVENOX) injection 40 mg Start: 10/12/23 2200  Code Status:   Code Status: Limited: Do not attempt resuscitation (DNR) -DNR-LIMITED -Do Not Intubate/DNI  Family Communication: spoke with patient's daughter by phone as update.   Level of care: Med-Surg Status is: Inpatient Remains inpatient appropriate because: awaiting SNF placement   Subjective: Patient sleeping on my exam, awakens easily.  No complaints today.  Asking when she can leave.  Didn't eat much breakfast, not hungry. No dyspnea/CP  Objective: Vitals:   10/26/23  1340 10/26/23 2053 10/27/23 0416 10/27/23 0500  BP: 115/63 99/64 97/62    Pulse: 76 (!) 59 (!) 55   Resp: 18 20 18    Temp: 98.3 F (36.8  C) 97.6 F (36.4 C) 97.6 F (36.4 C)   TempSrc: Oral Oral Oral   SpO2: 99% 95% 91%   Weight:    58.9 kg  Height:        Intake/Output Summary (Last 24 hours) at 10/27/2023 1305 Last data filed at 10/27/2023 1000 Gross per 24 hour  Intake 780 ml  Output 100 ml  Net 680 ml   Filed Weights   10/19/23 0500 10/20/23 0500 10/27/23 0500  Weight: 57.7 kg 54.4 kg 58.9 kg   Body mass index is 24.54 kg/m.  Gen: 78 y.o. female in no apparent distress.  Nontoxic.  Thin/cachectic.  Chronically ill-appearing.  Pulm: Non-labored breathing.  Coarse BS throughout CV: Regular rate and rhythm.  GI: Abdomen soft, non-tender.  Thin Ext: Warm, no deformities, +1 pedal edema  Skin: No rashes, lesions  Neuro: Alert and oriented only to person.  Cannot really name of her daughter.  Pleasantly demented but not acutely confused which seems to be her baseline.  No focal neurological deficits. Psych: Calm     I have personally reviewed the following labs and images: CBC: Recent Labs  Lab 10/23/23 0810 10/26/23 0441  WBC 6.5 8.6  NEUTROABS  --  6.9  HGB 15.6* 14.4  HCT 48.3* 44.7  MCV 96.8 95.1  PLT 202 208   BMP &GFR Recent Labs  Lab 10/23/23 0810 10/26/23 0441  NA 133* 137  K 4.4 4.1  CL 95* 99  CO2 28 29  GLUCOSE 82 108*  BUN 21 29*  CREATININE 0.43* 0.52  CALCIUM 8.7* 9.2  MG  --  2.1  PHOS  --  3.4   Estimated Creatinine Clearance: 50.1 mL/min (by C-G formula based on SCr of 0.52 mg/dL). Liver & Pancreas: Recent Labs  Lab 10/26/23 0441  ALBUMIN 2.7*   No results for input(s): "LIPASE", "AMYLASE" in the last 168 hours. No results for input(s): "AMMONIA" in the last 168 hours. Diabetic: No results for input(s): "HGBA1C" in the last 72 hours. No results for input(s): "GLUCAP" in the last 168 hours.  Cardiac Enzymes: No results for input(s): "CKTOTAL", "CKMB", "CKMBINDEX", "TROPONINI" in the last 168 hours. No results for input(s): "PROBNP" in the last 8760  hours. Coagulation Profile: No results for input(s): "INR", "PROTIME" in the last 168 hours. Thyroid Function Tests: No results for input(s): "TSH", "T4TOTAL", "FREET4", "T3FREE", "THYROIDAB" in the last 72 hours. Lipid Profile: No results for input(s): "CHOL", "HDL", "LDLCALC", "TRIG", "CHOLHDL", "LDLDIRECT" in the last 72 hours. Anemia Panel: No results for input(s): "VITAMINB12", "FOLATE", "FERRITIN", "TIBC", "IRON", "RETICCTPCT" in the last 72 hours. Urine analysis:    Component Value Date/Time   COLORURINE YELLOW 01/15/2013 2009   APPEARANCEUR TURBID (A) 01/15/2013 2009   LABSPEC 1.015 01/15/2013 2009   PHURINE 5.5 01/15/2013 2009   GLUCOSEU NEGATIVE 01/15/2013 2009   HGBUR NEGATIVE 01/15/2013 2009   BILIRUBINUR NEGATIVE 01/15/2013 2009   KETONESUR NEGATIVE 01/15/2013 2009   PROTEINUR NEGATIVE 01/15/2013 2009   UROBILINOGEN 0.2 01/15/2013 2009   NITRITE NEGATIVE 01/15/2013 2009   LEUKOCYTESUR SMALL (A) 01/15/2013 2009   Sepsis Labs: Invalid input(s): "PROCALCITONIN", "LACTICIDVEN"  Microbiology: No results found for this or any previous visit (from the past 240 hours).  Radiology Studies: No results found.  Scheduled Meds:  busPIRone  5 mg Oral  TID   donepezil  5 mg Oral Daily   enoxaparin (LOVENOX) injection  40 mg Subcutaneous Q24H   escitalopram  10 mg Oral Daily   Fluticasone-Umeclidin-Vilant  1 puff Inhalation Daily   folic acid  1 mg Oral Daily   lactose free nutrition  237 mL Oral TID BM   memantine  10 mg Oral BID   montelukast  10 mg Oral q morning   multivitamin with minerals  1 tablet Oral Daily   pravastatin  10 mg Oral Daily   predniSONE  10 mg Oral Q breakfast   traZODone  100 mg Oral QHS   Continuous Infusions:   LOS: 12 days   25 minutes with more than 50% spent in reviewing records, counseling patient/family and coordinating care.  Tobey Grim, MD [facility] [email or web address] 10/27/2023, 1:05 PM

## 2023-10-27 NOTE — Plan of Care (Signed)
   Problem: Education: Goal: Knowledge of General Education information will improve Description Including pain rating scale, medication(s)/side effects and non-pharmacologic comfort measures Outcome: Progressing   Problem: Health Behavior/Discharge Planning: Goal: Ability to manage health-related needs will improve Outcome: Progressing

## 2023-10-28 DIAGNOSIS — J441 Chronic obstructive pulmonary disease with (acute) exacerbation: Secondary | ICD-10-CM | POA: Diagnosis not present

## 2023-10-28 DIAGNOSIS — J9601 Acute respiratory failure with hypoxia: Secondary | ICD-10-CM

## 2023-10-28 DIAGNOSIS — F03A0 Unspecified dementia, mild, without behavioral disturbance, psychotic disturbance, mood disturbance, and anxiety: Secondary | ICD-10-CM | POA: Diagnosis not present

## 2023-10-28 DIAGNOSIS — J9621 Acute and chronic respiratory failure with hypoxia: Secondary | ICD-10-CM | POA: Diagnosis not present

## 2023-10-28 NOTE — Hospital Course (Addendum)
 78 year old woman from [place] memory care, PMH including COPD, dementia who presented with generalized weakness and hypoxia in the context of flu outbreak at facility.  Admitted for acute on chronic hypoxic respiratory failure, COPD exacerbation.  Also treated for acute diastolic CHF.  Condition failed to improve quickly and subsequent imaging revealed community-acquired pneumonia.  Condition slowly improved and hospitalization was prolonged by delay in return to memory care.  Consultants None   Procedures/Events None

## 2023-10-28 NOTE — Progress Notes (Signed)
 Nutrition Follow-up  DOCUMENTATION CODES:   Severe malnutrition in context of chronic illness  INTERVENTION:   -Boost Plus TID- Each supplement provides 360kcal and 14g protein.     -Magic cup TID with meals, each supplement provides 290 kcal and 9 grams of protein   -Multivitamin with minerals daily  NUTRITION DIAGNOSIS:   Severe Malnutrition related to chronic illness (dementia, COPD) as evidenced by severe fat depletion, severe muscle depletion.  Ongoing.  GOAL:   Patient will meet greater than or equal to 90% of their needs  Progressing.  MONITOR:   PO intake, Supplement acceptance  ASSESSMENT:   78 y.o. female with past medical history significant for dementia, asthma/COPD, anxiety/panic attacks, osteoporosis who presents to [facility] ED on 2/10 from [facility] memory care unit with weakness, cough and was noted to desaturate to 80% on room air. Admitted for acute hypoxic respite failure secondary to COPD exacerbation.  Patient alert/oriented x 1. Currently consuming 0-25% of meals. Did eat 10% of breakfast this morning which is has been typical this admission. Will add chocolate Magic cups to trays.   Admission weight: 147 lbs Current weight: 129 lbs  Medications: Folic acid, Prednisone  Labs reviewed.  Diet Order:   Diet Order             Diet regular Room service appropriate? Yes with Assist; Fluid consistency: Thin  Diet effective now                   EDUCATION NEEDS:   No education needs have been identified at this time  Skin:  Skin Assessment: Reviewed RN Assessment  Last BM:  2/25  Height:   Ht Readings from Last 1 Encounters:  10/12/23 5\' 1"  (1.549 m)    Weight:   Wt Readings from Last 1 Encounters:  10/28/23 58.8 kg    BMI:  Body mass index is 24.49 kg/m.  Estimated Nutritional Needs:   Kcal:  1700-1900  Protein:  75-85g  Fluid:  1.9L/day   Tilda Franco, MS, RD, LDN Inpatient Clinical  Dietitian Contact via Secure chat

## 2023-10-28 NOTE — Progress Notes (Signed)
 SATURATION QUALIFICATIONS: (This note is used to comply with regulatory documentation for home oxygen)  Patient Saturations on Room Air at Rest = 88%  Patient Saturations on Room Air while Ambulating = 90%  Patient Saturations on 2 Liters of oxygen while Ambulating = 92%  Please briefly explain why patient needs home oxygen:  ambulated to [facility] and in room by PT

## 2023-10-28 NOTE — Plan of Care (Signed)

## 2023-10-28 NOTE — Progress Notes (Signed)
 Occupational Therapy Treatment Patient Details Name: Kathy Skinner MRN: [id number] DOB: 1946/01/06 Today's Date: 10/28/2023   History of present illness 78 yo female admitted with acute on chronic respiratory failure. PMH: anxiety, panic attacks, osteoporosis,COPD, chronic respiratory failure, dementia, vertigo   OT comments  Pt more cooperative this session. Pt in recliner upon arrival and asking to use commode. Sit - stand ad SPT CGA over to [facility]. Pt required cues for safety and sequencing, mod A required for thorough anterior hygiene. CGA to wash/dry hands and face seated on BSC, CGA SPT back to recliner. Encouraged pt to eat more of her lunch on tray, however pt adamantly declined except for eating cookie and asking for more cookies and chocolate. OT will continue to follow acutely to maximize level of function      If plan is discharge home, recommend the following:  A lot of help with bathing/dressing/bathroom;Assistance with feeding;Direct supervision/assist for medications management;Direct supervision/assist for financial management;Assist for transportation;Supervision due to cognitive status;A little help with walking and/or transfers   Equipment Recommendations  Other (comment) (TBD at next level of care)    Recommendations for Other Services      Precautions / Restrictions Precautions Precautions: Fall Precaution/Restrictions Comments: Hx dementia Restrictions Weight Bearing Restrictions Per Provider Order: No       Mobility Bed Mobility               General bed mobility comments: OOB in recliner    Transfers Overall transfer level: Needs assistance Equipment used: None Transfers: Sit to/from Stand Sit to Stand: Contact guard assist           General transfer comment: recliner to [facility] and back to recliner CGA with cues for correct hand placement. assisted pt with thorough anterior hygiene     Balance Overall balance assessment: Needs  assistance Sitting-balance support: Feet supported, No upper extremity supported Sitting balance-Leahy Scale: Fair     Standing balance support: Bilateral upper extremity supported, Single extremity supported, During functional activity Standing balance-Leahy Scale: Poor                             ADL either performed or assessed with clinical judgement   ADL Overall ADL's : Needs assistance/impaired   Eating/Feeding Details (indicate cue type and reason): pt declining to eat anything on her tray but the cookie and asking for chocolate Grooming: Wash/dry hands;Wash/dry face;Contact guard assist;Sitting                   Toilet Transfer: Contact guard assist;Stand-pivot;BSC/3in1;Cueing for safety;Cueing for sequencing   Toileting- Architect and Hygiene: Moderate assistance;Sitting/lateral lean         General ADL Comments: recliner to [facility] and back to recliner CGA with cues for correct hand placement. assisted pt with thorough anterior hygiene    Extremity/Trunk Assessment Upper Extremity Assessment Upper Extremity Assessment: Generalized weakness   Lower Extremity Assessment Lower Extremity Assessment: Defer to PT evaluation   Cervical / Trunk Assessment Cervical / Trunk Assessment: Normal    Vision Ability to See in Adequate Light: 0 Adequate Patient Visual Report: No change from baseline     Perception     Praxis     Communication Communication Communication: No apparent difficulties   Cognition Arousal: Alert Behavior During Therapy: WFL for tasks assessed/performed Cognition: History of cognitive impairments, No family/caregiver present to determine baseline  Following commands: Impaired Following commands impaired: Follows one step commands with increased time, Follows one step commands inconsistently      Cueing   Cueing Techniques: Verbal cues, Gestural cues, Tactile cues   Exercises      Shoulder Instructions       General Comments      Pertinent Vitals/ Pain       Pain Assessment Pain Assessment: No/denies pain Faces Pain Scale: No hurt  Home Living                                          Prior Functioning/Environment              Frequency  Min 1X/week        Progress Toward Goals  OT Goals(current goals can now be found in the care plan section)  Progress towards OT goals: OT to reassess next treatment     Plan      Co-evaluation                 AM-PAC OT "6 Clicks" Daily Activity     Outcome Measure   Help from another person eating meals?: A Little Help from another person taking care of personal grooming?: A Little Help from another person toileting, which includes using toliet, bedpan, or urinal?: A Lot Help from another person bathing (including washing, rinsing, drying)?: A Lot Help from another person to put on and taking off regular upper body clothing?: A Little Help from another person to put on and taking off regular lower body clothing?: A Lot 6 Click Score: 15    End of Session Equipment Utilized During Treatment: Gait belt;Other (comment) (BSC)  OT Visit Diagnosis: Other abnormalities of gait and mobility (R26.89);Repeated falls (R29.6);Muscle weakness (generalized) (M62.81)   Activity Tolerance Patient limited by fatigue   Patient Left with call bell/phone within reach;in chair;with chair alarm set   Nurse Communication          Time: [phone] OT Time Calculation (min): 25 min  Charges: OT General Charges $OT Visit: 1 Visit OT Treatments $Self Care/Home Management : 8-22 mins $Therapeutic Activity: 8-22 mins    Galen Manila 10/28/2023, 12:58 PM

## 2023-10-28 NOTE — Progress Notes (Signed)
 Physical Therapy Treatment Patient Details Name: Kathy Skinner MRN: [id number] DOB: 1945/12/31 Today's Date: 10/28/2023   History of Present Illness 78 yo female admitted with acute on chronic respiratory failure. PMH: anxiety, panic attacks, osteoporosis,COPD, chronic respiratory failure, dementia, vertigo    PT Comments   AxO x 1 pleasant.  Engaging.  Cooperative.  Required repeat instruction due to Hx Dementia.  Pleasant.  Asking for chocolate. Incont BB. Pt asking for "Chocolate" Pt was OOB in recliner.  Assisted on/off BSC  General transfer comment: pt able to self from recliner VC's on proper hand placement then quickly turn/pivot 1/4 to [facility] and with poor forward flexed posture with bent hips/knees.  Pt quick to sit and incomplete turn.  Required assist to reposition center [facility]. Tria RA was 90%.   Pt had a large BM.  Assisted with peri care seated as pt was unable to stand long enough.  Then assisted another 1/4 pivot to recliner.  Pt was unable to stand long enough to attempt any amb today.  Positioned in recliner yto comfort.       If plan is discharge home, recommend the following: A lot of help with walking and/or transfers;Direct supervision/assist for medications management;Assist for transportation;Direct supervision/assist for financial management;A lot of help with bathing/dressing/bathroom;Assistance with cooking/housework;Supervision due to cognitive status;Help with stairs or ramp for entrance   Can travel by private vehicle     No  Equipment Recommendations  None recommended by PT    Recommendations for Other Services       Precautions / Restrictions Precautions Precautions: Fall Precaution/Restrictions Comments: Hx Memory Loss Restrictions Weight Bearing Restrictions Per Provider Order: No     Mobility  Bed Mobility               General bed mobility comments: OOB in recliner    Transfers Overall transfer level: Needs assistance Equipment used:  None Transfers: Sit to/from Stand Sit to Stand: Supervision, Contact guard assist           General transfer comment: pt able to self from recliner VC's on proper hand placement then quickly turn/pivot 1/4 to [facility] and with poor forward flexed posture with bent hips/knees.  Pt quick to sit and incomplete turn.  Required assist to reposition center [facility]  Pt had a large BM.  Assisted with peri care seated as pt was unable to stand long enough.  Then assisted another 1/4 pivot to recliner.    Ambulation/Gait               General Gait Details: pt was unable to fully support herself long enough to take any steps today due to weakness.  "I can't".  "Not today" stated pt   Stairs             Wheelchair Mobility     Tilt Bed    Modified Rankin (Stroke Patients Only)       Balance                                            Communication Communication Communication: No apparent difficulties  Cognition Arousal: Alert Behavior During Therapy: WFL for tasks assessed/performed                           PT - Cognition Comments: AxO x 1 pleasant.  Engaging.  Cooperative.  Required repeat instruction due to Hx Dementia.  Pleasant.  Asking for chocolate. Incont BB. Following commands: Impaired Following commands impaired: Follows one step commands with increased time, Follows one step commands inconsistently    Cueing Cueing Techniques: Verbal cues, Gestural cues, Tactile cues  Exercises      General Comments        Pertinent Vitals/Pain Pain Assessment Pain Assessment: No/denies pain    Home Living                          Prior Function            PT Goals (current goals can now be found in the care plan section) Progress towards PT goals: Progressing toward goals    Frequency    Min 1X/week      PT Plan      Co-evaluation              AM-PAC PT "6 Clicks" Mobility   Outcome Measure  Help needed  turning from your back to your side while in a flat bed without using bedrails?: A Lot Help needed moving from lying on your back to sitting on the side of a flat bed without using bedrails?: A Lot Help needed moving to and from a bed to a chair (including a wheelchair)?: A Lot Help needed standing up from a chair using your arms (e.g., wheelchair or bedside chair)?: A Lot Help needed to walk in hospital room?: Total Help needed climbing 3-5 steps with a railing? : Total 6 Click Score: 10    End of Session Equipment Utilized During Treatment: Gait belt;Oxygen Activity Tolerance: Patient tolerated treatment well Patient left: in chair;with call bell/phone within reach;with chair alarm set Nurse Communication: Mobility status PT Visit Diagnosis: Muscle weakness (generalized) (M62.81);Difficulty in walking, not elsewhere classified (R26.2);History of falling (Z91.81)     Time: 1478-2956 PT Time Calculation (min) (ACUTE ONLY): 25 min  Charges:    $Therapeutic Activity: 23-37 mins PT General Charges $$ ACUTE PT VISIT: 1 Visit                     Felecia Shelling  PTA Acute  Rehabilitation Services Office M-F          [phone]

## 2023-10-28 NOTE — TOC Progression Note (Signed)
 Transition of Care [name]) - Progression Note    Patient Details  Name: Kathy Skinner MRN: [id number] Date of Birth: 29-Oct-1945  Transition of [facility]) CM/SW Contact  Amada Jupiter, LCSW Phone Number: 10/28/2023, 3:12 PM  Clinical Narrative:     Resident director from [name] was onsite this morning to evaluate patient and they have cleared pt for readmission to their facility.  Facility requests referral to community hospice and daughter aware/ agreeable and chooses [name].  Referral has been placed today (with [name], RN).  Pt will, also, need O2 at facility and daughter with no DME agency preference - referral placed with [name] with request they deliver to [name] tomorrow morning.  Facility requests FL2 and dc summary been sent as early as possible tomorrow for review.    Expected Discharge Plan: Memory Care Barriers to Discharge: Continued Medical Work up  Expected Discharge Plan and Services   Discharge Planning Services: CM Consult Post Acute Care Choice:  (memory care/HHPT)                                         Social Determinants of Health (SDOH) Interventions SDOH Screenings   Food Insecurity: No Food Insecurity (10/13/2023)  Housing: Low Risk  (10/13/2023)  Transportation Needs: No Transportation Needs (10/13/2023)  Utilities: Not At Risk (10/13/2023)  Social Connections: Patient Unable To Answer (10/13/2023)  Tobacco Use: Medium Risk (10/12/2023)    Readmission Risk Interventions     No data to display

## 2023-10-28 NOTE — Progress Notes (Signed)
  Progress Note   Patient: Kathy Skinner WUJ:[id number] DOB: Jan 26, 1946 DOA: 10/12/2023     13 DOS: the patient was seen and examined on 10/28/2023   Brief hospital course: 78 year old woman admitted for COPD exacerbation and hypoxia.  Assessment and Plan: Acute hypoxic respiratory failure/COPD exacerbation COPD exacerbation resolved.   Community-acquired pneumonia Completed 5-day course of azithromycin and ceftriaxone.   Acute on chronic diastolic congestive heart failure Acute component resolved   Anxiety/panic attacks -- Lexapro 10 mg p.o. daily -- BuSpar 5 mg p.o. 3 times daily -Stable   Hyperlipidemia -- Pravastatin 10 mg p.o. daily   Dementia -- No behavioral problems -- Namenda 10 mg p.o. twice daily -- Donepezil 5 mg p.o. daily - Fairly demented/confused at baseline.  Oriented to person.   Weakness/debility/deconditioning: -- PT/OT evaluation:  - Awaiting SNF placement   Plan for memory care tomorrow    Subjective:  Feels ok No concerns voiced per nursing  Physical Exam: Vitals:   10/27/23 2319 10/28/23 0500 10/28/23 0704 10/28/23 1309  BP: 107/64  119/66 99/67  Pulse: (!) 57  (!) 55 85  Resp: 16  18 18   Temp: 98.4 F (36.9 C)  (!) 97.5 F (36.4 C) 98.5 F (36.9 C)  TempSrc: Oral  Oral Oral  SpO2: 91%  98% 90%  Weight:  58.8 kg    Height:       Physical Exam Vitals reviewed.  Constitutional:      General: She is not in acute distress.    Appearance: She is not ill-appearing or toxic-appearing.  Cardiovascular:     Rate and Rhythm: Normal rate and regular rhythm.     Heart sounds: No murmur heard. Pulmonary:     Effort: Pulmonary effort is normal. No respiratory distress.     Breath sounds: No wheezing, rhonchi or rales.  Neurological:     Mental Status: She is alert.  Psychiatric:        Mood and Affect: Mood normal.        Behavior: Behavior normal.     Data Reviewed: No new data  Family Communication: none  Disposition: Status  is: Inpatient Remains inpatient appropriate because: await memory care     Time spent: 20 minutes  Author: Brendia Sacks, MD 10/28/2023 7:27 PM  For on call review [email or web address].

## 2023-10-29 DIAGNOSIS — J9621 Acute and chronic respiratory failure with hypoxia: Secondary | ICD-10-CM | POA: Diagnosis not present

## 2023-10-29 DIAGNOSIS — J441 Chronic obstructive pulmonary disease with (acute) exacerbation: Secondary | ICD-10-CM | POA: Diagnosis not present

## 2023-10-29 NOTE — Discharge Summary (Addendum)
 Physician Discharge Summary   Patient: Kathy Skinner MRN: [id number] DOB: 02/11/1946  Admit date:     10/12/2023  Discharge date: 10/29/23  Discharge Physician: Brendia Sacks   PCP: Laurell Josephs, MD (Inactive)   Recommendations at discharge:   See below  Discharge Diagnoses: Principal Problem:   Acute on chronic respiratory failure with hypoxia [facility]) Active Problems:   Dementia (HCC)   Mild TBI (HCC)   Generalized anxiety disorder   GERD (gastroesophageal reflux disease)   Tobacco use   MDD (major depressive disorder)   COPD with acute exacerbation (HCC)   Acute respiratory failure with hypoxia (HCC)   Protein-calorie malnutrition, severe Community-acquired pneumonia Acute on chronic diastolic CHF  Resolved Problems:   * No resolved hospital problems. *  Hospital Course: 78 year old woman from [place] memory care, PMH including COPD, dementia who presented with generalized weakness and hypoxia in the context of flu outbreak at facility.  Admitted for acute on chronic hypoxic respiratory failure, COPD exacerbation.  Also treated for acute diastolic CHF.  Condition failed to improve quickly and subsequent imaging revealed community-acquired pneumonia.  Condition slowly improved and hospitalization was prolonged by delay in return to memory care.  Consultants None   Procedures/Events None   Acute/chronic hypoxic respiratory failure -- resolved COPD exacerbation -- resolved Hypoxic at facility.  Negative for influenza, COVID, RSV, respiratory viral panel. Mildly hypoxic, continue oxygen on discharge as needed  SATURATION QUALIFICATIONS: (This note is used to comply with regulatory documentation for home oxygen)   Patient Saturations on Room Air at Rest = 88%   Patient Saturations on Room Air while Ambulating = 90%   Patient Saturations on 2 Liters of oxygen while Ambulating = 92%   Please briefly explain why patient needs home oxygen:  ambulated to [facility]  and in room by PT   Community-acquired pneumonia Completed 5-day course of azithromycin and ceftriaxone.   Acute on chronic diastolic congestive heart failure LVEF 65 to 70%.  Grade 1 diastolic dysfunction.   Treated with Lasix.  Euvolemic now.  Does not require scheduled diuretic.  Monitor weights.  Given advanced age, would not recommend further evaluation or treatment.   Anxiety/panic attacks Stable   Hyperlipidemia Can continue statin but would consider discontinuing based on outpatient goals of care   Dementia Mild TBI Generalized anxiety disorder MDD -- No behavioral problems Can continue Namenda and donepezil   Weakness/debility/deconditioning: Plan return to memory care  Severe malnutrition in context of chronic illness Boost Plus TID- Each supplement provides 360kcal and 14g protein.    Magic cup TID with meals, each supplement provides 290 kcal and 9 grams of protein  Multivitamin with minerals daily  Aortic atherosclerosis Emphysema     Disposition:  Return to memory care Diet recommendation:  Diet Orders (From admission, onward)     Start     Ordered   10/19/23 1103  Diet regular Room service appropriate? Yes with Assist; Fluid consistency: Thin  Diet effective now       Question Answer Comment  Room service appropriate? Yes with Assist   Fluid consistency: Thin      10/19/23 1102            DISCHARGE MEDICATION: Allergies as of 10/29/2023       Reactions   Neurontin [gabapentin] Other (See Comments)   seizures   Tramadol Swelling   Codeine Nausea Only        Medication List     STOP taking these medications  celecoxib 100 MG capsule Commonly known as: CELEBREX   oseltamivir 75 MG capsule Commonly known as: TAMIFLU   Vitamin D (Ergocalciferol) 1.25 MG (50000 UNIT) Caps capsule Commonly known as: DRISDOL       TAKE these medications    acetaminophen 325 MG tablet Commonly known as: TYLENOL Take 2 tablets (650 mg total)  by mouth every 6 (six) hours as needed for mild pain (or Fever >/= 101).   albuterol 108 (90 Base) MCG/ACT inhaler Commonly known as: ProAir HFA Inhale 2 puffs into the lungs every 6 (six) hours as needed for wheezing or shortness of breath.   albuterol (2.5 MG/3ML) 0.083% nebulizer solution Commonly known as: PROVENTIL Take 3 mLs (2.5 mg total) by nebulization every 6 (six) hours as needed for wheezing or shortness of breath.   busPIRone 5 MG tablet Commonly known as: BUSPAR Take 5 mg by mouth 3 (three) times daily.   donepezil 5 MG tablet Commonly known as: ARICEPT Take 5 mg by mouth daily.   escitalopram 10 MG tablet Commonly known as: LEXAPRO Take 10 mg by mouth daily.   folic acid 800 MCG tablet Commonly known as: FOLVITE Take 800 mcg by mouth daily.   lactose free nutrition Liqd Take 237 mLs by mouth 3 (three) times daily as needed (for weight loss).   memantine 10 MG tablet Commonly known as: NAMENDA Take 1 tablet (10 mg total) by mouth 2 (two) times daily.   montelukast 10 MG tablet Commonly known as: SINGULAIR Take 1 tablet (10 mg total) by mouth every morning.   polyethylene glycol 17 g packet Commonly known as: MIRALAX / GLYCOLAX Take 17 g by mouth daily as needed for mild constipation.   pravastatin 10 MG tablet Commonly known as: PRAVACHOL TAKE ONE TABLET BY MOUTH ONE TIME DAILY   traZODone 100 MG tablet Commonly known as: DESYREL Take 1 tablet (100 mg total) by mouth at bedtime.   Trelegy Ellipta 100-62.5-25 MCG/ACT Aepb Generic drug: Fluticasone-Umeclidin-Vilant Inhale 1 puff into the lungs daily.               Durable Medical Equipment  (From admission, onward)           Start     Ordered   10/28/23 1350  For home use only DME oxygen  Once       Question Answer Comment  Length of Need Lifetime   Mode or (Route) Nasal cannula   Liters per Minute 2   Frequency Continuous (stationary and portable oxygen unit needed)   Oxygen  conserving device Yes   Oxygen delivery system Gas      10/28/23 1349            Follow-up Information     Laurell Josephs, MD Follow up.   Specialty: Specialist Why: As needed Contact information: [address] [place] [address] [phone]                Feels ok  Discharge Exam: Filed Weights   10/20/23 0500 10/27/23 0500 10/28/23 0500  Weight: 54.4 kg 58.9 kg 58.8 kg   Physical Exam Vitals reviewed.  Constitutional:      General: She is not in acute distress.    Appearance: She is not ill-appearing or toxic-appearing.  Cardiovascular:     Rate and Rhythm: Normal rate and regular rhythm.     Heart sounds: No murmur heard. Pulmonary:     Effort: Pulmonary effort is normal. No respiratory distress.  Breath sounds: No wheezing, rhonchi or rales.  Abdominal:     Palpations: Abdomen is soft.  Neurological:     Mental Status: She is alert.  Psychiatric:        Mood and Affect: Mood normal.        Behavior: Behavior normal.      Condition at discharge: good  The results of significant diagnostics from this hospitalization (including imaging, microbiology, ancillary and laboratory) are listed below for reference.   Imaging Studies: CT Angio Chest Pulmonary Embolism (PE) W or WO Contrast Result Date: 10/17/2023 CLINICAL DATA:  Weakness, cough, hypoxia. EXAM: CT ANGIOGRAPHY CHEST WITH CONTRAST TECHNIQUE: Multidetector CT imaging of the chest was performed using the standard protocol during bolus administration of intravenous contrast. Multiplanar CT image reconstructions and MIPs were obtained to evaluate the vascular anatomy. RADIATION DOSE REDUCTION: This exam was performed according to the departmental dose-optimization program which includes automated exposure control, adjustment of the mA and/or kV according to patient size and/or use of iterative reconstruction technique. CONTRAST:  75mL OMNIPAQUE IOHEXOL 350 MG/ML SOLN COMPARISON:   02/05/2015. FINDINGS: Cardiovascular: Ascending thoracic aorta aneurysm 4 cm. No dissection. No pulmonary artery filling defects to suggest PE. No cardiomegaly or pericardial effusion. Atheromatous calcifications of the aorta and coronary arteries. Mediastinum/Nodes: No enlarged mediastinal, hilar, or axillary lymph nodes. Thyroid gland, trachea, and esophagus demonstrate no significant findings. Lungs/Pleura: Dense consolidation or volume loss identified in the lung bases bilaterally. Bilateral lower lobe nodular interstitial process consistent with inflammatory process or infection. Follow up recommended to ensure resolution of this finding. Consider repeat CT in 3 months. Emphysematous scarring identified in the upper lungs. No pneumothorax or pleural effusions. Upper Abdomen: No acute abnormality. Musculoskeletal: Thoracic degenerative disc disease. Osteopenia. Lower thoracic compression deformity is unchanged. Review of the MIP images confirms the above findings. IMPRESSION: 1. No evidence of PE or dissection. 2. 4 cm ascending thoracic aorta aneurysm. 3. Bilateral lower lobe consolidation or volume loss. 4. Bilateral lower lobe nodular interstitial process consistent with inflammatory process or infection. Follow up recommended to ensure resolution of this finding. 5. Aortic and coronary artery atherosclerosis. Aortic Atherosclerosis (ICD10-I70.0) and Emphysema (ICD10-J43.9). Electronically Signed   By: Layla Maw M.D.   On: 10/17/2023 16:12   ECHOCARDIOGRAM COMPLETE Result Date: 10/14/2023    ECHOCARDIOGRAM REPORT   Patient Name:   Kathy Skinner Date of Exam: 10/14/2023 Medical Rec #:  [id number]     Height:       61.0 in Accession #:    [id number]    Weight:       147.5 lb Date of Birth:  Jul 11, 1946     BSA:          1.659 m Patient Age:    77 years      BP:           113/64 mmHg Patient Gender: F             HR:           80 bpm. Exam Location:  Inpatient Procedure: Cardiac Doppler, Color Doppler and  2D Echo (Both Spectral and Color            Flow Doppler were utilized during procedure). Indications:    Dyspnea  History:        Patient has prior history of Echocardiogram examinations, most                 recent 11/05/2016. COPD; Risk Factors:Current Smoker.  Sonographer:  Amy Chionchio Referring Phys: [id number] ERIC J [place]  Sonographer Comments: Suboptimal apical window, suboptimal parasternal window and Technically difficult study due to poor echo windows. Image acquisition challenging due to COPD. IMPRESSIONS  1. Very technically difficult study - based on subcostal images, the LVEF appears hyperdynamic - no gross focal wall motion abnormalities.  2. Left ventricular ejection fraction, by estimation, is 65 to 70%. The left ventricle has normal function. The left ventricle has no regional wall motion abnormalities. Left ventricular diastolic parameters are consistent with Grade I diastolic dysfunction (impaired relaxation).  3. Right ventricular systolic function is normal. The right ventricular size is normal.  4. Left atrial size was mildly dilated.  5. The mitral valve was not well visualized. No evidence of mitral valve regurgitation.  6. The aortic valve was not well visualized. Aortic valve regurgitation is not visualized. FINDINGS  Left Ventricle: Left ventricular ejection fraction, by estimation, is 65 to 70%. The left ventricle has normal function. The left ventricle has no regional wall motion abnormalities. Strain imaging was not performed. The left ventricular internal cavity  size was normal in size. There is no left ventricular hypertrophy. Left ventricular diastolic parameters are consistent with Grade I diastolic dysfunction (impaired relaxation). Indeterminate filling pressures. Right Ventricle: The right ventricular size is normal. No increase in right ventricular wall thickness. Right ventricular systolic function is normal. Left Atrium: Left atrial size was mildly dilated. Right Atrium:  Right atrial size was normal in size. Pericardium: There is no evidence of pericardial effusion. Mitral Valve: The mitral valve was not well visualized. No evidence of mitral valve regurgitation. MV peak gradient, 1.7 mmHg. The mean mitral valve gradient is 1.0 mmHg. Tricuspid Valve: The tricuspid valve is not well visualized. Tricuspid valve regurgitation is not demonstrated. Aortic Valve: The aortic valve was not well visualized. Aortic valve regurgitation is not visualized. Aortic valve mean gradient measures 3.0 mmHg. Aortic valve peak gradient measures 6.1 mmHg. Aortic valve area, by VTI measures 2.42 cm. Pulmonic Valve: The pulmonic valve was not well visualized. Pulmonic valve regurgitation is not visualized. Aorta: The aortic root was not well visualized and the ascending aorta was not well visualized. Venous: The inferior vena cava was not well visualized. IAS/Shunts: No atrial level shunt detected by color flow Doppler. Additional Comments: 3D imaging was not performed.  LEFT VENTRICLE PLAX 2D LVIDd:         4.30 cm   Diastology LVIDs:         3.30 cm   LV e' medial:    4.35 cm/s LV PW:         1.00 cm   LV E/e' medial:  11.3 LV IVS:        0.90 cm   LV e' lateral:   6.85 cm/s LVOT diam:     2.10 cm   LV E/e' lateral: 7.2 LV SV:         52 LV SV Index:   31 LVOT Area:     3.46 cm  RIGHT VENTRICLE         IVC TAPSE (M-mode): 1.6 cm  IVC diam: 1.40 cm AORTIC VALVE                    PULMONIC VALVE AV Area (Vmax):    2.46 cm     PV Vmax:       0.91 m/s AV Area (Vmean):   2.31 cm     PV Peak grad:  3.3 mmHg AV  Area (VTI):     2.42 cm AV Vmax:           123.00 cm/s AV Vmean:          89.600 cm/s AV VTI:            0.215 m AV Peak Grad:      6.1 mmHg AV Mean Grad:      3.0 mmHg LVOT Vmax:         87.50 cm/s LVOT Vmean:        59.800 cm/s LVOT VTI:          0.150 m LVOT/AV VTI ratio: 0.70 MITRAL VALVE MV Area (PHT): 2.77 cm    SHUNTS MV Area VTI:   3.20 cm    Systemic VTI:  0.15 m MV Peak grad:  1.7 mmHg     Systemic Diam: 2.10 cm MV Mean grad:  1.0 mmHg MV Vmax:       0.66 m/s MV Vmean:      39.8 cm/s MV Decel Time: 274 msec MV E velocity: 49.20 cm/s MV A velocity: 56.90 cm/s MV E/A ratio:  0.86 Zoila Shutter MD Electronically signed by Zoila Shutter MD Signature Date/Time: 10/14/2023/5:11:21 PM    Final    DG CHEST PORT 1 VIEW Result Date: 10/14/2023 CLINICAL DATA:  Shortness of breath. EXAM: PORTABLE CHEST 1 VIEW COMPARISON:  Radiograph 10/11/2022. FINDINGS: Stable heart size and mediastinal contours. Aortic atherosclerosis. Mild bibasilar atelectasis without confluent airspace disease. No pulmonary edema. No pleural effusion or pneumothorax. Rotated exam with scoliosis. IMPRESSION: Mild bibasilar atelectasis. Electronically Signed   By: Narda Rutherford M.D.   On: 10/14/2023 15:46   DG Chest Portable 1 View Result Date: 10/12/2023 CLINICAL DATA:  Weakness and cough. EXAM: PORTABLE CHEST 1 VIEW COMPARISON:  Chest radiograph dated November 25, 2022. FINDINGS: Limited exam due to suboptimal positioning/patient rotation. Stable borderline cardiomegaly. Aortic atherosclerosis. No appreciable focal consolidation, large pleural effusion, or pneumothorax. The right costophrenic angle is not included within the field of view. No acute osseous abnormality identified. IMPRESSION: 1. No acute cardiopulmonary findings. 2. Borderline cardiomegaly. Electronically Signed   By: Hart Robinsons M.D.   On: 10/12/2023 17:04    Microbiology: Results for orders placed or performed during the hospital encounter of 10/12/23  Resp panel by RT-PCR (RSV, Flu A&B, Covid) Anterior Nasal Swab     Status: None   Collection Time: 10/12/23  2:53 PM   Specimen: Anterior Nasal Swab  Result Value Ref Range Status   SARS Coronavirus 2 by RT PCR NEGATIVE NEGATIVE Final    Comment: (NOTE) SARS-CoV-2 target nucleic acids are NOT DETECTED.  The SARS-CoV-2 RNA is generally detectable in upper respiratory specimens during the acute phase of  infection. The lowest concentration of SARS-CoV-2 viral copies this assay can detect is 138 copies/mL. A negative result does not preclude SARS-Cov-2 infection and should not be used as the sole basis for treatment or other patient management decisions. A negative result may occur with  improper specimen collection/handling, submission of specimen other than nasopharyngeal swab, presence of viral mutation(s) within the areas targeted by this assay, and inadequate number of viral copies(<138 copies/mL). A negative result must be combined with clinical observations, patient history, and epidemiological information. The expected result is Negative.  Fact Sheet for Patients:  BloggerCourse.com  Fact Sheet for Healthcare Providers:  SeriousBroker.it  This test is no t yet approved or cleared by the [place] FDA and  has been authorized for detection and/or  diagnosis of SARS-CoV-2 by FDA under an Emergency Use Authorization (EUA). This EUA will remain  in effect (meaning this test can be used) for the duration of the COVID-19 declaration under Section 564(b)(1) of the Act, 21 U.S.C.section 360bbb-3(b)(1), unless the authorization is terminated  or revoked sooner.       Influenza A by PCR NEGATIVE NEGATIVE Final   Influenza B by PCR NEGATIVE NEGATIVE Final    Comment: (NOTE) The Xpert Xpress SARS-CoV-2/FLU/RSV plus assay is intended as an aid in the diagnosis of influenza from Nasopharyngeal swab specimens and should not be used as a sole basis for treatment. Nasal washings and aspirates are unacceptable for Xpert Xpress SARS-CoV-2/FLU/RSV testing.  Fact Sheet for Patients: BloggerCourse.com  Fact Sheet for Healthcare Providers: SeriousBroker.it  This test is not yet approved or cleared by the [place] FDA and has been authorized for detection and/or diagnosis of SARS-CoV-2  by FDA under an Emergency Use Authorization (EUA). This EUA will remain in effect (meaning this test can be used) for the duration of the COVID-19 declaration under Section 564(b)(1) of the Act, 21 U.S.C. section 360bbb-3(b)(1), unless the authorization is terminated or revoked.     Resp Syncytial Virus by PCR NEGATIVE NEGATIVE Final    Comment: (NOTE) Fact Sheet for Patients: BloggerCourse.com  Fact Sheet for Healthcare Providers: SeriousBroker.it  This test is not yet approved or cleared by the [place] FDA and has been authorized for detection and/or diagnosis of SARS-CoV-2 by FDA under an Emergency Use Authorization (EUA). This EUA will remain in effect (meaning this test can be used) for the duration of the COVID-19 declaration under Section 564(b)(1) of the Act, 21 U.S.C. section 360bbb-3(b)(1), unless the authorization is terminated or revoked.  Performed at [facility], [address] [place], [place] [address]   Respiratory (~20 pathogens) panel by PCR     Status: None   Collection Time: 10/12/23  2:53 PM   Specimen: Nasopharyngeal Swab; Respiratory  Result Value Ref Range Status   Adenovirus NOT DETECTED NOT DETECTED Final   Coronavirus 229E NOT DETECTED NOT DETECTED Final    Comment: (NOTE) The Coronavirus on the Respiratory Panel, DOES NOT test for the novel  Coronavirus (2019 nCoV)    Coronavirus HKU1 NOT DETECTED NOT DETECTED Final   Coronavirus NL63 NOT DETECTED NOT DETECTED Final   Coronavirus OC43 NOT DETECTED NOT DETECTED Final   Metapneumovirus NOT DETECTED NOT DETECTED Final   Rhinovirus / Enterovirus NOT DETECTED NOT DETECTED Final   Influenza A NOT DETECTED NOT DETECTED Final   Influenza B NOT DETECTED NOT DETECTED Final   Parainfluenza Virus 1 NOT DETECTED NOT DETECTED Final   Parainfluenza Virus 2 NOT DETECTED NOT DETECTED Final   Parainfluenza Virus 3 NOT DETECTED NOT  DETECTED Final   Parainfluenza Virus 4 NOT DETECTED NOT DETECTED Final   Respiratory Syncytial Virus NOT DETECTED NOT DETECTED Final   Bordetella pertussis NOT DETECTED NOT DETECTED Final   Bordetella Parapertussis NOT DETECTED NOT DETECTED Final   Chlamydophila pneumoniae NOT DETECTED NOT DETECTED Final   Mycoplasma pneumoniae NOT DETECTED NOT DETECTED Final    Comment: Performed at [facility], [address] [place], [place] [address]  MRSA Next Gen by PCR, Nasal     Status: Abnormal   Collection Time: 10/15/23 12:06 AM   Specimen: Nasal Mucosa; Nasal Swab  Result Value Ref Range Status   MRSA by PCR Next Gen DETECTED (A) NOT DETECTED Final    Comment: (  NOTE) The GeneXpert MRSA Assay (FDA approved for NASAL specimens only), is one component of a comprehensive MRSA colonization surveillance program. It is not intended to diagnose MRSA infection nor to guide or monitor treatment for MRSA infections. Test performance is not FDA approved in patients less than 70 years old. Performed at [facility], [address] [place], [place] [address]     Labs: CBC: Recent Labs  Lab 10/23/23 0810 10/26/23 0441  WBC 6.5 8.6  NEUTROABS  --  6.9  HGB 15.6* 14.4  HCT 48.3* 44.7  MCV 96.8 95.1  PLT 202 208   Basic Metabolic Panel: Recent Labs  Lab 10/23/23 0810 10/26/23 0441  NA 133* 137  K 4.4 4.1  CL 95* 99  CO2 28 29  GLUCOSE 82 108*  BUN 21 29*  CREATININE 0.43* 0.52  CALCIUM 8.7* 9.2  MG  --  2.1  PHOS  --  3.4   Liver Function Tests: Recent Labs  Lab 10/26/23 0441  ALBUMIN 2.7*   CBG: No results for input(s): "GLUCAP" in the last 168 hours.  Discharge time spent: greater than 30 minutes.  Signed: Brendia Sacks, MD [facility] 10/29/2023

## 2023-10-29 NOTE — Care Management Important Message (Signed)
 Important Message  Patient Details IM Letter given. Name: Kathy Skinner MRN: [id number] Date of Birth: 03-21-1946   Important Message Given:  Yes - Medicare IM     Kathy Skinner 10/29/2023, 3:20 PM

## 2023-10-29 NOTE — Progress Notes (Signed)
 PT takes home medication (Trelegy). This med shows up on Worklist but not the MAR.

## 2023-10-29 NOTE — Progress Notes (Signed)
 Patient was picked up by [facility] and given the discharge packet. Patient was stable for discharge and was returning to [facility] memory care unit.

## 2023-10-29 NOTE — [place] FL2 (Deleted)
 [place] MEDICAID FL2 LEVEL OF CARE FORM     IDENTIFICATION  Patient Name: Kathy Skinner Birthdate: [birth year] Sex: female Admission Date (Current Location): 10/12/2023  [facility] and [place] Number:  Producer, television/film/video and Address:  [facility],  [address], [place] [address]      Provider Number: [id number]  Attending Physician Name and Address:  Standley Brooking, MD  Relative Name and Phone Number:  Thayer Ohm (Daughter)  [phone]    Current Level of Care: Hospital Recommended Level of Care: Memory Care Prior Approval Number:    Date Approved/Denied:   PASRR Number: NA  Discharge Plan: Other (Comment) (Memory Care)    Current Diagnoses: Patient Active Problem List   Diagnosis Date Noted   Protein-calorie malnutrition, severe 10/19/2023   Acute respiratory failure with hypoxia (HCC) 10/15/2023   COPD with acute exacerbation (HCC) 10/12/2023   Acute on chronic respiratory failure with hypoxia (HCC) 10/12/2023   OP (osteoporosis) 01/19/2023   Obesity (BMI 30-39.9) 11/26/2022   Dizziness 11/26/2022   Mild TBI (HCC) 11/25/2022   Dementia (HCC) 11/25/2022   MDD (major depressive disorder) 02/04/2019   Tobacco use 07/08/2018   Chronic respiratory failure (HCC) 11/04/2016   GERD (gastroesophageal reflux disease) 12/17/2015   Hypersomnolence 02/13/2015   COPD GOLD D 02/03/2013   Allergic rhinitis 02/03/2013   Generalized anxiety disorder 02/03/2013    Orientation RESPIRATION BLADDER Height & Weight     Self  O2 Continent Weight: 129 lb 10.1 oz (58.8 kg) Height:  5\' 1"  (154.9 cm)  BEHAVIORAL SYMPTOMS/MOOD NEUROLOGICAL BOWEL NUTRITION STATUS      Continent Diet (No added table salt)  AMBULATORY STATUS COMMUNICATION OF NEEDS Skin   Extensive Assist Verbally Normal                       Personal Care Assistance Level of Assistance  Bathing, Feeding, Dressing Bathing Assistance: Limited assistance Feeding assistance: Limited  assistance Dressing Assistance: Limited assistance     Functional Limitations Info  Sight, Hearing, Speech Sight Info: Adequate Hearing Info: Adequate Speech Info: Impaired (Dentures-top)    SPECIAL CARE FACTORS FREQUENCY                       Contractures Contractures Info: Not present    Additional Factors Info  Code Status, Allergies Code Status Info: DNR Allergies Info: Neurontin (Gabapentin), Tramadol, Codeine           Current Medications (10/29/2023):  This is the current hospital active medication list Current Facility-Administered Medications  Medication Dose Route Frequency Provider Last Rate Last Admin   acetaminophen (TYLENOL) tablet 650 mg  650 mg Oral Q6H PRN Bobette Mo, MD   650 mg at 10/21/23 2200   Or   acetaminophen (TYLENOL) suppository 650 mg  650 mg Rectal Q6H PRN Bobette Mo, MD       albuterol (PROVENTIL) (2.5 MG/3ML) 0.083% nebulizer solution 2.5 mg  2.5 mg Nebulization Q4H PRN Bobette Mo, MD       busPIRone (BUSPAR) tablet 5 mg  5 mg Oral TID Bobette Mo, MD   5 mg at 10/29/23 5621   donepezil (ARICEPT) tablet 5 mg  5 mg Oral Daily Bobette Mo, MD   5 mg at 10/28/23 2106   enoxaparin (LOVENOX) injection 40 mg  40 mg Subcutaneous Q24H Bobette Mo, MD   40 mg at 10/28/23 2106   escitalopram (LEXAPRO) tablet 10 mg  10 mg Oral Daily Bobette Mo, MD   10 mg at 10/29/23 1610   Fluticasone-Umeclidin-Vilant 100-62.5-25 MCG/ACT AEPB 1 puff  1 puff Inhalation Daily [place], Eric J, DO   100 mcg at 10/29/23 1124   folic acid (FOLVITE) tablet 1 mg  1 mg Oral Daily Bobette Mo, MD   1 mg at 10/29/23 9604   lactose free nutrition (BOOST PLUS) liquid 237 mL  237 mL Oral TID BM [place], Eric J, DO   237 mL at 10/29/23 0919   memantine (NAMENDA) tablet 10 mg  10 mg Oral BID Bobette Mo, MD   10 mg at 10/29/23 5409   montelukast (SINGULAIR) tablet 10 mg  10 mg Oral q morning Bobette Mo, MD   10 mg at 10/29/23 8119   multivitamin with minerals tablet 1 tablet  1 tablet Oral Daily [place], Eric J, DO   1 tablet at 10/29/23 0917   ondansetron [facility]) tablet 4 mg  4 mg Oral Q6H PRN Bobette Mo, MD       Or   ondansetron [facility]) injection 4 mg  4 mg Intravenous Q6H PRN Bobette Mo, MD       polyethylene glycol [facility]) packet 17 g  17 g Oral Daily PRN Bobette Mo, MD       pravastatin (PRAVACHOL) tablet 10 mg  10 mg Oral Daily Bobette Mo, MD   10 mg at 10/29/23 1478   traZODone (DESYREL) tablet 100 mg  100 mg Oral QHS Bobette Mo, MD   100 mg at 10/28/23 2106     Discharge Medications: Please see discharge summary for a list of discharge medications.  Relevant Imaging Results:  Relevant Lab Results:   Additional Information ss#[id number]  Kathy Tetro, LCSW

## 2023-10-29 NOTE — TOC Transition Note (Signed)
 Transition of Care [facility]) - Discharge Note   Patient Details  Name: Kathy Skinner MRN: [id number] Date of Birth: 1945/11/21  Transition of Care [facility]) CM/SW Contact:  Amada Jupiter, LCSW Phone Number: 10/29/2023, 3:01 PM   Clinical Narrative:     Pt is medically cleared for dc today and [place] has reviewed records and cleared pt for return.  Have confirmed that all needed DME has been delivered to facility (O2, hospital bed and wheelchair).  Daughter aware and PTAR called at 2:45pm.  [name] to follow pt for hospice services at facility.  No further TOC needs.  Final next level of care: Memory Care Barriers to Discharge: Barriers Resolved   Patient Goals and CMS Choice Patient states their goals for this hospitalization and ongoing recovery are:: [facility]-memory care CMS Medicare.gov Compare Post Acute Care list provided to:: Patient Represenative (must comment) (Susie(dtr)) Choice offered to / list presented to : Adult Children [place] ownership interest in [facility].provided to:: Adult Children    Discharge Placement                  Name of family member notified: daughter, Susie Patient and family notified of of transfer: 10/29/23  Discharge Plan and Services Additional resources added to the After Visit Summary for     Discharge Planning Services: CM Consult Post Acute Care Choice:  (memory care/HHPT)          DME Arranged: Oxygen, Wheelchair manual, Hospital bed DME Agency: [employer] Date DME Agency Contacted: 10/29/23   Representative spoke with at DME Agency: Ian Malkin            Social Drivers of Health (SDOH) Interventions SDOH Screenings   Food Insecurity: No Food Insecurity (10/13/2023)  Housing: Low Risk  (10/13/2023)  Transportation Needs: No Transportation Needs (10/13/2023)  Utilities: Not At Risk (10/13/2023)  Social Connections: Patient Unable To Answer (10/13/2023)  Tobacco Use: Medium Risk (10/12/2023)      Readmission Risk Interventions    10/29/2023    3:00 PM  Readmission Risk Prevention Plan  Post Dischage Appt Complete  Medication Screening Complete  Transportation Screening Complete

## 2023-10-29 NOTE — [place] FL2 (Signed)
 [place] MEDICAID FL2 LEVEL OF CARE FORM     IDENTIFICATION  Patient Name: Kathy Skinner Birthdate: 23-Mar-1946 Sex: female Admission Date (Current Location): 10/12/2023  [facility] and [place] Number:  Producer, television/film/video and Address:  [facility],  [address], [place] [address]      Provider Number: [id number]  Attending Physician Name and Address:  Standley Brooking, MD  Relative Name and Phone Number:  Thayer Ohm (Daughter)  [phone]    Current Level of Care: Hospital Recommended Level of Care: Memory Care Prior Approval Number:    Date Approved/Denied:   PASRR Number: NA  Discharge Plan: Other (Comment) (Memory Care)    Current Diagnoses: Patient Active Problem List   Diagnosis Date Noted   Protein-calorie malnutrition, severe 10/19/2023   Acute respiratory failure with hypoxia (HCC) 10/15/2023   COPD with acute exacerbation (HCC) 10/12/2023   Acute on chronic respiratory failure with hypoxia (HCC) 10/12/2023   OP (osteoporosis) 01/19/2023   Obesity (BMI 30-39.9) 11/26/2022   Dizziness 11/26/2022   Mild TBI (HCC) 11/25/2022   Dementia (HCC) 11/25/2022   MDD (major depressive disorder) 02/04/2019   Tobacco use 07/08/2018   Chronic respiratory failure (HCC) 11/04/2016   GERD (gastroesophageal reflux disease) 12/17/2015   Hypersomnolence 02/13/2015   COPD GOLD D 02/03/2013   Allergic rhinitis 02/03/2013   Generalized anxiety disorder 02/03/2013    Orientation RESPIRATION BLADDER Height & Weight     Self  O2 Continent Weight: 129 lb 10.1 oz (58.8 kg) Height:  5\' 1"  (154.9 cm)  BEHAVIORAL SYMPTOMS/MOOD NEUROLOGICAL BOWEL NUTRITION STATUS      Continent Diet (No added table salt)  AMBULATORY STATUS COMMUNICATION OF NEEDS Skin   Extensive Assist Verbally Normal                       Personal Care Assistance Level of Assistance  Bathing, Feeding, Dressing Bathing Assistance: Limited assistance Feeding assistance: Limited  assistance Dressing Assistance: Limited assistance     Functional Limitations Info  Sight, Hearing, Speech Sight Info: Adequate Hearing Info: Adequate Speech Info: Impaired (Dentures-top)    SPECIAL CARE FACTORS FREQUENCY                       Contractures Contractures Info: Not present    Additional Factors Info  Code Status, Allergies Code Status Info: DNR Allergies Info: Neurontin (Gabapentin), Tramadol, Codeine           Discharge Medications:  acetaminophen 325 MG tablet Commonly known as: TYLENOL Take 2 tablets (650 mg total) by mouth every 6 (six) hours as needed for mild pain (or Fever >/= 101).    albuterol 108 (90 Base) MCG/ACT inhaler Commonly known as: ProAir HFA Inhale 2 puffs into the lungs every 6 (six) hours as needed for wheezing or shortness of breath.    albuterol (2.5 MG/3ML) 0.083% nebulizer solution Commonly known as: PROVENTIL Take 3 mLs (2.5 mg total) by nebulization every 6 (six) hours as needed for wheezing or shortness of breath.    busPIRone 5 MG tablet Commonly known as: BUSPAR Take 5 mg by mouth 3 (three) times daily.    donepezil 5 MG tablet Commonly known as: ARICEPT Take 5 mg by mouth daily.    escitalopram 10 MG tablet Commonly known as: LEXAPRO Take 10 mg by mouth daily.    folic acid 800 MCG tablet Commonly known as: FOLVITE Take 800 mcg by mouth daily.    lactose free  nutrition Liqd Take 237 mLs by mouth 3 (three) times daily as needed (for weight loss).    memantine 10 MG tablet Commonly known as: NAMENDA Take 1 tablet (10 mg total) by mouth 2 (two) times daily.    montelukast 10 MG tablet Commonly known as: SINGULAIR Take 1 tablet (10 mg total) by mouth every morning.    polyethylene glycol 17 g packet Commonly known as: MIRALAX / GLYCOLAX Take 17 g by mouth daily as needed for mild constipation.    pravastatin 10 MG tablet Commonly known as: PRAVACHOL TAKE ONE TABLET BY MOUTH ONE TIME DAILY     traZODone 100 MG tablet Commonly known as: DESYREL Take 1 tablet (100 mg total) by mouth at bedtime.    Trelegy Ellipta 100-62.5-25 MCG/ACT Aepb Generic drug: Fluticasone-Umeclidin-Vilant Inhale 1 puff into the lungs dai    Relevant Imaging Results:  Relevant Lab Results:   Additional Information ss#[id number]  Tene Gato, LCSW

## 2023-10-29 NOTE — [place] FL2 (Deleted)
 [place] MEDICAID FL2 LEVEL OF CARE FORM     IDENTIFICATION  Patient Name: Nalaya Wojdyla Birthdate: Apr 02, 1946 Sex: female Admission Date (Current Location): 10/12/2023  [facility] and [place] Number:  Producer, television/film/video and Address:  [facility],  [address] [place], [place] [address]      Provider Number: [id number]  Attending Physician Name and Address:  Standley Brooking, MD  Relative Name and Phone Number:  Thayer Ohm (Daughter)  [phone]    Current Level of Care: Hospital Recommended Level of Care: Memory Care Prior Approval Number:    Date Approved/Denied:   PASRR Number: NA  Discharge Plan: Other (Comment) (ALF - memory care)    Current Diagnoses: Patient Active Problem List   Diagnosis Date Noted   Protein-calorie malnutrition, severe 10/19/2023   Acute respiratory failure with hypoxia (HCC) 10/15/2023   COPD with acute exacerbation (HCC) 10/12/2023   Acute on chronic respiratory failure with hypoxia (HCC) 10/12/2023   OP (osteoporosis) 01/19/2023   Obesity (BMI 30-39.9) 11/26/2022   Dizziness 11/26/2022   Mild TBI (HCC) 11/25/2022   Dementia (HCC) 11/25/2022   MDD (major depressive disorder) 02/04/2019   Tobacco use 07/08/2018   Chronic respiratory failure (HCC) 11/04/2016   GERD (gastroesophageal reflux disease) 12/17/2015   Hypersomnolence 02/13/2015   COPD GOLD D 02/03/2013   Allergic rhinitis 02/03/2013   Generalized anxiety disorder 02/03/2013    Orientation RESPIRATION BLADDER Height & Weight     Self  O2 Continent Weight: 129 lb 10.1 oz (58.8 kg) Height:  5\' 1"  (154.9 cm)  BEHAVIORAL SYMPTOMS/MOOD NEUROLOGICAL BOWEL NUTRITION STATUS      Continent Diet (Heart Healthy)  AMBULATORY STATUS COMMUNICATION OF NEEDS Skin   Extensive Assist Verbally Normal                       Personal Care Assistance Level of Assistance  Bathing, Feeding, Dressing Bathing Assistance: Limited assistance Feeding assistance: Limited  assistance Dressing Assistance: Limited assistance     Functional Limitations Info  Sight, Hearing, Speech Sight Info: Adequate Hearing Info: Adequate Speech Info: Impaired (Dentures-top)    SPECIAL CARE FACTORS FREQUENCY                       Contractures Contractures Info: Not present    Additional Factors Info  Code Status, Allergies Code Status Info: DNR Allergies Info: Neurontin (Gabapentin), Tramadol, Codeine           Discharge Medications:  TAKE these medications     acetaminophen 325 MG tablet Commonly known as: TYLENOL Take 2 tablets (650 mg total) by mouth every 6 (six) hours as needed for mild pain (or Fever >/= 101).    albuterol 108 (90 Base) MCG/ACT inhaler Commonly known as: ProAir HFA Inhale 2 puffs into the lungs every 6 (six) hours as needed for wheezing or shortness of breath.    albuterol (2.5 MG/3ML) 0.083% nebulizer solution Commonly known as: PROVENTIL Take 3 mLs (2.5 mg total) by nebulization every 6 (six) hours as needed for wheezing or shortness of breath.    busPIRone 5 MG tablet Commonly known as: BUSPAR Take 5 mg by mouth 3 (three) times daily.    donepezil 5 MG tablet Commonly known as: ARICEPT Take 5 mg by mouth daily.    escitalopram 10 MG tablet Commonly known as: LEXAPRO Take 10 mg by mouth daily.    folic acid 800 MCG tablet Commonly known as: FOLVITE Take 800 mcg by  mouth daily.    lactose free nutrition Liqd Take 237 mLs by mouth 3 (three) times daily as needed (for weight loss).    memantine 10 MG tablet Commonly known as: NAMENDA Take 1 tablet (10 mg total) by mouth 2 (two) times daily.    montelukast 10 MG tablet Commonly known as: SINGULAIR Take 1 tablet (10 mg total) by mouth every morning.    polyethylene glycol 17 g packet Commonly known as: MIRALAX / GLYCOLAX Take 17 g by mouth daily as needed for mild constipation.    pravastatin 10 MG tablet Commonly known as: PRAVACHOL TAKE ONE TABLET BY  MOUTH ONE TIME DAILY    traZODone 100 MG tablet Commonly known as: DESYREL Take 1 tablet (100 mg total) by mouth at bedtime.    Trelegy Ellipta 100-62.5-25 MCG/ACT Aepb Generic drug: Fluticasone-Umeclidin-Vilant Inhale 1 puff into the lungs daily.            Relevant Imaging Results:  Relevant Lab Results:   Additional Information ss#[phone]  Leemon Ayala, LCSW

## 2023-10-31 ENCOUNTER — Other Ambulatory Visit: Payer: Self-pay | Admitting: Emergency Medicine

## 2023-11-02 DIAGNOSIS — R634 Abnormal weight loss: Secondary | ICD-10-CM | POA: Diagnosis not present

## 2023-11-02 DIAGNOSIS — R627 Adult failure to thrive: Secondary | ICD-10-CM | POA: Diagnosis not present

## 2023-11-02 DIAGNOSIS — J9621 Acute and chronic respiratory failure with hypoxia: Secondary | ICD-10-CM | POA: Diagnosis not present

## 2023-11-02 DIAGNOSIS — J441 Chronic obstructive pulmonary disease with (acute) exacerbation: Secondary | ICD-10-CM | POA: Diagnosis not present

## 2023-11-17 ENCOUNTER — Other Ambulatory Visit: Payer: Self-pay | Admitting: Emergency Medicine

## 2023-11-23 DIAGNOSIS — J9621 Acute and chronic respiratory failure with hypoxia: Secondary | ICD-10-CM | POA: Diagnosis not present

## 2023-11-23 DIAGNOSIS — J441 Chronic obstructive pulmonary disease with (acute) exacerbation: Secondary | ICD-10-CM | POA: Diagnosis not present

## 2023-11-23 DIAGNOSIS — W19XXXD Unspecified fall, subsequent encounter: Secondary | ICD-10-CM | POA: Diagnosis not present

## 2023-11-30 ENCOUNTER — Other Ambulatory Visit: Payer: Self-pay | Admitting: Emergency Medicine

## 2023-12-22 ENCOUNTER — Other Ambulatory Visit: Payer: Self-pay | Admitting: Emergency Medicine

## 2024-02-12 ENCOUNTER — Ambulatory Visit: Payer: 59

## 2024-02-12 MED ORDER — DENOSUMAB 60 MG/ML ~~LOC~~ SOSY: 60.0000 mg | PREFILLED_SYRINGE | Freq: Once | SUBCUTANEOUS | Status: AC

## 2024-06-15 ENCOUNTER — Other Ambulatory Visit: Payer: Self-pay | Admitting: Emergency Medicine

## 2024-06-22 ENCOUNTER — Other Ambulatory Visit (HOSPITAL_COMMUNITY): Payer: Self-pay | Admitting: Pharmacy Technician

## 2024-06-22 ENCOUNTER — Telehealth: Payer: Self-pay | Admitting: Pharmacy Technician

## 2024-06-22 NOTE — Telephone Encounter (Signed)
 Auth Submission: APPROVED Site of care: Site of care: [facility] Payer: [employer] Medication & CPT/J Code(s) submitted: Prolia  (Denosumab ) [id number] Diagnosis Code:  Route of submission (phone, fax, portal):  Phone # Fax # Auth type: Buy/Bill PB Units/visits requested: X2 DOSES Reference number: [id number] Approval from: 01/12/24 to 01/11/25

## 2024-06-27 ENCOUNTER — Other Ambulatory Visit: Payer: Self-pay | Admitting: Emergency Medicine

## 2024-06-28 ENCOUNTER — Ambulatory Visit (INDEPENDENT_AMBULATORY_CARE_PROVIDER_SITE_OTHER)

## 2024-06-28 VITALS — BP 121/77 | HR 73 | Temp 97.4°F | Resp 22 | Ht 60.0 in | Wt 125.0 lb

## 2024-06-28 DIAGNOSIS — M810 Age-related osteoporosis without current pathological fracture: Secondary | ICD-10-CM | POA: Diagnosis not present

## 2024-06-28 MED ORDER — DENOSUMAB 60 MG/ML ~~LOC~~ SOSY
60.0000 mg | PREFILLED_SYRINGE | Freq: Once | SUBCUTANEOUS | Status: AC
  Administered 2024-06-28: 60 mg via SUBCUTANEOUS
  Filled 2024-06-28: qty 1

## 2024-06-28 NOTE — Progress Notes (Signed)
 Diagnosis: Osteoporosis  Provider:  Mannam, Praveen MD  Procedure: Injection  Prolia  (Denosumab ), Dose: 60 mg, Site: subcutaneous, Number of injections: 1  Injection Site(s): Left upper quad. abdomen  Post Care: Patient declined observation  Discharge: Condition: Stable, Destination: Home . AVS Declined  Performed by:  Rocky FORBES Sar, RN

## 2024-07-11 ENCOUNTER — Other Ambulatory Visit: Payer: Self-pay | Admitting: Emergency Medicine

## 2024-07-20 ENCOUNTER — Other Ambulatory Visit: Payer: Self-pay | Admitting: Emergency Medicine

## 2024-07-20 MED ORDER — TRELEGY ELLIPTA 100-62.5-25 MCG/ACT IN AEPB: 1.0000 | INHALATION_SPRAY | Freq: Every day | RESPIRATORY_TRACT | 0 refills | Status: DC

## 2024-07-20 NOTE — Telephone Encounter (Signed)
 Pt must keep appt with Dr. Shelah on 09/30/24 for further refills.

## 2024-07-20 NOTE — Telephone Encounter (Signed)
 Copied from CRM #[id number]. Topic: Clinical - Medication Refill >> Jul 20, 2024 11:29 AM Celestine FALCON wrote: Medication: Fluticasone -Umeclidin-Vilant (TRELEGY ELLIPTA ) 100-62.5-25 MCG/ACT AEPB  Has the patient contacted their pharmacy? Yes (Agent: If no, request that the patient contact the pharmacy for the refill. If patient does not wish to contact the pharmacy document the reason why and proceed with request.) (Agent: If yes, when and what did the pharmacy advise?)  This is the patient's preferred pharmacy:  [facility] [address] - [place], [place] - [address], [address] [place] [address] Phone: [phone] Fax: [phone]  Is this the correct pharmacy for this prescription? Yes If no, delete pharmacy and type the correct one.   Has the prescription been filled recently? Yes  Is the patient out of the medication? Yes  Has the patient been seen for an appointment in the last year OR does the patient have an upcoming appointment? Yes; pt is due for the yearly follow up and will be scheduling today on the same call.   Can we respond through MyChart? Yes  Agent: Please be advised that Rx refills may take up to 3 business days. We ask that you follow-up with your pharmacy.

## 2024-07-29 ENCOUNTER — Encounter: Payer: Self-pay | Admitting: Endocrinology

## 2024-08-24 ENCOUNTER — Other Ambulatory Visit: Payer: Self-pay | Admitting: Emergency Medicine

## 2024-08-24 NOTE — Telephone Encounter (Signed)
 Copied from CRM #[id number]. Topic: Clinical - Medication Refill >> Aug 24, 2024 10:22 AM Rilla B wrote: Medication: Fluticasone -Umeclidin-Vilant (TRELEGY ELLIPTA ) 100-62.5-25 MCG/ACT AEPB   Has the patient contacted their pharmacy? Yes (Agent: If no, request that the patient contact the pharmacy for the refill. If patient does not wish to contact the pharmacy document the reason why and proceed with request.) (Agent: If yes, when and what did the pharmacy advise?)  This is the patient's preferred pharmacy:  [facility] [address] - [place], [place] - [address] [place] [address] Phone: [phone] Fax: [phone]  Is this the correct pharmacy for this prescription? Yes If no, delete pharmacy and type the correct one.   Has the prescription been filled recently? Yes  Is the patient out of the medication? Yes  Has the patient been seen for an appointment in the last year OR does the patient have an upcoming appointment? Yes  Can we respond through MyChart? Yes  Agent: Please be advised that Rx refills may take up to 3 business days. We ask that you follow-up with your pharmacy.

## 2024-08-24 NOTE — Telephone Encounter (Signed)
 Patient needs to keep scheduled ov with Dr. Shelah in January 2026 for further refills.

## 2024-09-30 ENCOUNTER — Ambulatory Visit: Admitting: Emergency Medicine

## 2024-11-11 ENCOUNTER — Ambulatory Visit: Admitting: Emergency Medicine

## 2024-12-28 ENCOUNTER — Ambulatory Visit
